# Patient Record
Sex: Female | Born: 1985 | Race: White | Hispanic: Yes | Marital: Single | Smoking: Never smoker
Health system: Southern US, Community
[De-identification: ages and names within clinical notes are randomized; demographics above are authoritative.]

## PROBLEM LIST (undated history)

## (undated) ENCOUNTER — Inpatient Hospital Stay (HOSPITAL_COMMUNITY): Payer: Self-pay

## (undated) DIAGNOSIS — I10 Essential (primary) hypertension: Secondary | ICD-10-CM

## (undated) DIAGNOSIS — D649 Anemia, unspecified: Secondary | ICD-10-CM

## (undated) HISTORY — DX: Essential (primary) hypertension: I10

## (undated) HISTORY — PX: NO PAST SURGERIES: SHX2092

## (undated) HISTORY — DX: Anemia, unspecified: D64.9

---
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
## 2006-02-16 ENCOUNTER — Ambulatory Visit: Payer: Self-pay | Admitting: Gynecology

## 2006-02-22 ENCOUNTER — Inpatient Hospital Stay (HOSPITAL_COMMUNITY): Admission: AD | Admit: 2006-02-22 | Discharge: 2006-02-22 | Payer: Self-pay | Admitting: Obstetrics and Gynecology

## 2006-02-22 ENCOUNTER — Ambulatory Visit: Payer: Self-pay | Admitting: Obstetrics and Gynecology

## 2006-02-23 ENCOUNTER — Inpatient Hospital Stay (HOSPITAL_COMMUNITY): Admission: AD | Admit: 2006-02-23 | Discharge: 2006-02-25 | Payer: Self-pay | Admitting: Family Medicine

## 2006-02-23 ENCOUNTER — Ambulatory Visit: Payer: Self-pay | Admitting: Gynecology

## 2014-07-24 ENCOUNTER — Encounter: Payer: Self-pay | Admitting: Obstetrics & Gynecology

## 2018-05-23 ENCOUNTER — Other Ambulatory Visit (HOSPITAL_COMMUNITY): Payer: Self-pay | Admitting: Family

## 2018-05-23 DIAGNOSIS — Z3A13 13 weeks gestation of pregnancy: Secondary | ICD-10-CM

## 2018-05-23 DIAGNOSIS — Z3689 Encounter for other specified antenatal screening: Secondary | ICD-10-CM

## 2018-05-24 ENCOUNTER — Encounter: Payer: Self-pay | Admitting: Family Medicine

## 2018-05-24 ENCOUNTER — Ambulatory Visit (INDEPENDENT_AMBULATORY_CARE_PROVIDER_SITE_OTHER): Payer: Medicaid Other | Admitting: Family Medicine

## 2018-05-24 DIAGNOSIS — O10019 Pre-existing essential hypertension complicating pregnancy, unspecified trimester: Secondary | ICD-10-CM

## 2018-05-24 DIAGNOSIS — O10011 Pre-existing essential hypertension complicating pregnancy, first trimester: Secondary | ICD-10-CM | POA: Diagnosis not present

## 2018-05-24 DIAGNOSIS — Z3A11 11 weeks gestation of pregnancy: Secondary | ICD-10-CM | POA: Diagnosis not present

## 2018-05-24 DIAGNOSIS — O169 Unspecified maternal hypertension, unspecified trimester: Secondary | ICD-10-CM | POA: Insufficient documentation

## 2018-05-24 DIAGNOSIS — O0991 Supervision of high risk pregnancy, unspecified, first trimester: Secondary | ICD-10-CM

## 2018-05-24 DIAGNOSIS — O0990 Supervision of high risk pregnancy, unspecified, unspecified trimester: Secondary | ICD-10-CM

## 2018-05-24 LAB — POCT URINALYSIS DIP (DEVICE)
Bilirubin Urine: NEGATIVE
Glucose, UA: NEGATIVE mg/dL
HGB URINE DIPSTICK: NEGATIVE
Ketones, ur: NEGATIVE mg/dL
Leukocytes, UA: NEGATIVE
NITRITE: NEGATIVE
PH: 8 (ref 5.0–8.0)
Protein, ur: NEGATIVE mg/dL
SPECIFIC GRAVITY, URINE: 1.02 (ref 1.005–1.030)
UROBILINOGEN UA: 0.2 mg/dL (ref 0.0–1.0)

## 2018-05-24 MED ORDER — PRENATAL VITAMINS 0.8 MG PO TABS: 1.0000 | ORAL_TABLET | Freq: Every day | ORAL | 12 refills | Status: DC

## 2018-05-24 NOTE — Progress Notes (Signed)
   PRENATAL VISIT NOTE  Subjective:  Alexis Potts is a 33 y.o. G2P1001 at [redacted]w[redacted]d being seen today for transferring prenatal care from [facility] due to HTN.  She is currently monitored for the following issues for this high-risk pregnancy and has Supervision of high risk pregnancy, antepartum and Hypertension in pregnancy, antepartum on their problem list.  Patient reports no complaints.  Contractions: Not present. Vag. Bleeding: None.  Movement: Absent. Denies leaking of fluid.   The following portions of the patient's history were reviewed and updated as appropriate: allergies, current medications, past family history, past medical history, past social history, past surgical history and problem list. Problem list updated.  Objective:   Vitals:   05/24/18 0828 05/24/18 0900  BP:  131/85  Pulse:  80  Weight:  145 lb (65.8 kg)  Height: 5' (1.524 m)     Fetal Status: Fetal Heart Rate (bpm): 156   Movement: Absent     General:  Alert, oriented and cooperative. Patient is in no acute distress.  Skin: Skin is warm and dry. No rash noted.   Cardiovascular: Normal heart rate noted  Respiratory: Normal respiratory effort, no problems with respiration noted  Abdomen: Soft, gravid, appropriate for gestational age.  Pain/Pressure: Absent     Pelvic: Cervical exam deferred        Extremities: Normal range of motion.  Edema: None  Mental Status: Normal mood and affect. Normal behavior. Normal judgment and thought content.   Assessment and Plan:  Pregnancy: G2P1001 at [redacted]w[redacted]d  1. Supervision of high risk pregnancy, antepartum desires genetic testing. Needs labs from [facility] - Prenatal Multivit-Min-Fe-FA (PRENATAL VITAMINS) 0.8 MG tablet; Take 1 tablet by mouth daily.  Dispense: 30 tablet; Refill: 12 - Genetic Screening  2. Pre-existing essential hypertension during pregnancy, antepartum On baby ASA start at 12 wks Baseline labs Discussed serial u/s for growth, close monitoring -  Comprehensive metabolic panel - Protein / creatinine ratio, urine  General obstetric precautions including but not limited to vaginal bleeding, contractions, leaking of fluid and fetal movement were reviewed in detail with the patient. Please refer to After Visit Summary for other counseling recommendations.  Return in 4 weeks (on 06/21/2018).  Future Appointments  Date Time Provider Department Center  06/07/2018  8:45 AM WH-MFC [place] 5 WH-MFCUS MFC-US  06/07/2018  9:45 AM WH-MFC LAB WH-MFC MFC-US  06/21/2018  3:15 PM Reva Bores, MD WOC-[facility]A WOC    Reva Bores, MD

## 2018-05-24 NOTE — Patient Instructions (Signed)
° °Lactancia materna °Breastfeeding ° °Decidir amamantar es una de las mejores elecciones que puede hacer por usted y su bebé. Un cambio en las hormonas durante el embarazo hace que las mamas produzcan leche materna en las glándulas productoras de leche. Las hormonas impiden que la leche materna sea liberada antes del nacimiento del bebé. Además, impulsan el flujo de leche luego del nacimiento. Una vez que ha comenzado a amamantar, pensar en el bebé, así como la succión o el llanto, pueden estimular la liberación de leche de las glándulas productoras de leche. °Los beneficios de amamantar °Las investigaciones demuestran que la lactancia materna ofrece muchos beneficios de salud para bebés y madres. Además, ofrece una forma gratuita y conveniente de alimentar al bebé. °Para el bebé °· La primera leche (calostro) ayuda a mejorar el funcionamiento del aparato digestivo del bebé. °· Las células especiales de la leche (anticuerpos) ayudan a combatir las infecciones en el bebé. °· Los bebés que se alimentan con leche materna también tienen menos probabilidades de tener asma, alergias, obesidad o diabetes de tipo 2. Además, tienen menor riesgo de sufrir el síndrome de muerte súbita del lactante (SMSL). °· Los nutrientes de la leche materna son mejores para satisfacer las necesidades del bebé en comparación con la leche maternizada. °· La leche materna mejora el desarrollo cerebral del bebé. °Para usted °· La lactancia materna favorece el desarrollo de un vínculo muy especial entre la madre y el bebé. °· Es conveniente. La leche materna es económica y siempre está disponible a la temperatura correcta. °· La lactancia materna ayuda a quemar calorías. Le ayuda a perder el peso ganado durante el embarazo. °· Hace que el útero vuelva al tamaño que tenía antes del embarazo más rápido. Además, disminuye el sangrado (loquios) después del parto. °· La lactancia materna contribuye a reducir el riesgo de tener diabetes de tipo 2,  osteoporosis, artritis reumatoide, enfermedades cardiovasculares y cáncer de mama, ovario, útero y endometrio en el futuro. °Información básica sobre la lactancia °Comienzo de la lactancia °· Encuentre un lugar cómodo para sentarse o acostarse, con un buen respaldo para el cuello y la espalda. °· Coloque una almohada o una manta enrollada debajo del bebé para acomodarlo a la altura de la mama (si está sentada). Las almohadas para amamantar se han diseñado especialmente a fin de servir de apoyo para los brazos y el bebé mientras amamanta. °· Asegúrese de que la barriga del bebé (abdomen) esté frente a la suya. °· Masajee suavemente la mama. Con las yemas de los dedos, masajee los bordes exteriores de la mama hacia adentro, en dirección al pezón. Esto estimula el flujo de leche. Si la leche fluye lentamente, es posible que deba continuar con este movimiento durante la lactancia. °· Sostenga la mama con 4 dedos por debajo y el pulgar por arriba del pezón (forme la letra “C” con la mano). Asegúrese de que los dedos se encuentren lejos del pezón y de la boca del bebé. °· Empuje suavemente los labios del bebé con el pezón o con el dedo. °· Cuando la boca del bebé se abra lo suficiente, acérquelo rápidamente a la mama e introduzca todo el pezón y la aréola, tanto como sea posible, dentro de la boca del bebé. La aréola es la zona de color que rodea al pezón. °? Debe haber más aréola visible por arriba del labio superior del bebé que por debajo del labio inferior. °? Los labios del bebé deben estar abiertos y extendidos hacia afuera (evertidos) para asegurar   que el bebé se prenda de forma adecuada y cómoda. °? La lengua del bebé debe estar entre la encía inferior y la mama. °· Asegúrese de que la boca del bebé esté en la posición correcta alrededor del pezón (prendido). Los labios del bebé deben crear un sello sobre la mama y estar doblados hacia afuera (invertidos). °· Es común que el bebé succione durante 2 a 3 minutos  para que comience el flujo de leche materna. °Cómo debe prenderse °Es muy importante que le enseñe al bebé cómo prenderse adecuadamente a la mama. Si el bebé no se prende adecuadamente, puede causar dolor en los pezones, reducir la producción de leche materna y hacer que el bebé tenga un escaso aumento de peso. Además, si el bebé no se prende adecuadamente al pezón, puede tragar aire durante la alimentación. Esto puede causarle molestias al bebé. Hacer eructar al bebé al cambiar de mama puede ayudarlo a liberar el aire. Sin embargo, enseñarle al bebé cómo prenderse a la mama adecuadamente es la mejor manera de evitar que se sienta molesto por tragar aire mientras se alimenta. °Signos de que el bebé se ha prendido adecuadamente al pezón °· Tironea o succiona de modo silencioso, sin causarle dolor. Los labios del bebé deben estar extendidos hacia afuera (evertidos). °· Se escucha que traga cada 3 o 4 succiones una vez que la leche ha comenzado a fluir (después de que se produzca el reflejo de eyección de la leche). °· Hay movimientos musculares por arriba y por delante de sus oídos al succionar. °Signos de que el bebé no se ha prendido adecuadamente al pezón °· Hace ruidos de succión o de chasquido mientras se alimenta. °· Siente dolor en los pezones. °Si cree que el bebé no se prendió correctamente, deslice el dedo en la comisura de la boca y colóquelo entre las encías del bebé para interrumpir la succión. Intente volver a comenzar a amamantar. °Signos de lactancia materna exitosa °Signos del bebé °· El bebé disminuirá gradualmente el número de succiones o dejará de succionar por completo. °· El bebé se quedará dormido. °· El cuerpo del bebé se relajará. °· El bebé retendrá una pequeña cantidad de leche en la boca. °· El bebé se desprenderá solo del pecho. °Signos que presenta usted °· Las mamas han aumentado la firmeza, el peso y el tamaño 1 a 3 horas después de amamantar. °· Están más blandas inmediatamente después  de amamantar. °· Se producen un aumento del volumen de leche y un cambio en su consistencia y color hacia el quinto día de lactancia. °· Los pezones no duelen, no están agrietados ni sangran. °Signos de que su bebé recibe la cantidad de leche suficiente °· Mojar por lo menos 1 o 2 pañales durante las primeras 24 horas después del nacimiento. °· Mojar por lo menos 5 o 6 pañales cada 24 horas durante la primera semana después del nacimiento. La orina debe ser clara o de color amarillo pálido a los 5 días de vida. °· Mojar entre 6 y 8 pañales cada 24 horas a medida que el bebé sigue creciendo y desarrollándose. °· Defeca por lo menos 3 veces en 24 horas a los 5 días de vida. Las heces deben ser blandas y amarillentas. °· Defeca por lo menos 3 veces en 24 horas a los 7 días de vida. Las heces deben ser grumosas y amarillentas. °· No registra una pérdida de peso mayor al 10 % del peso al nacer durante los primeros 3 días de vida. °· Aumenta de peso un promedio de 4   a 7 onzas (113 a 198 g) por semana después de los 4 días de vida. °· Aumenta de peso, diariamente, de manera uniforme a partir de los 5 días de vida, sin registrar pérdida de peso después de las 2 semanas de vida. °Después de alimentarse, es posible que el bebé regurgite una pequeña cantidad de leche. Esto es normal. °Frecuencia y duración de la lactancia °El amamantamiento frecuente la ayudará a producir más leche y puede prevenir dolores en los pezones y las mamas extremadamente llenas (congestión mamaria). Alimente al bebé cuando muestre signos de hambre o si siente la necesidad de reducir la congestión de las mamas. Esto se denomina "lactancia a demanda". Las señales de que el bebé tiene hambre incluyen las siguientes: °· Aumento del estado de alerta, actividad o inquietud. °· Mueve la cabeza de un lado a otro. °· Abre la boca cuando se le toca la mejilla o la comisura de la boca (reflejo de búsqueda). °· Aumenta las vocalizaciones, tales como sonidos de  succión, se relame los labios, emite arrullos, suspiros o chirridos. °· Mueve la mano hacia la boca y se chupa los dedos o las manos. °· Está molesto o llora. °Evite el uso del chupete en las primeras 4 a 6 semanas después del nacimiento del bebé. Después de este período, podrá usar un chupete. Las investigaciones demostraron que el uso del chupete durante el primer año de vida del bebé disminuye el riesgo de tener el síndrome de muerte súbita del lactante (SMSL). °Permita que el niño se alimente en cada mama todo lo que desee. Cuando el bebé se desprende o se queda dormido mientras se está alimentando de la primera mama, ofrézcale la segunda. Debido a que, con frecuencia, los recién nacidos están somnolientos las primeras semanas de vida, es posible que deba despertar al bebé para alimentarlo. °Los horarios de lactancia varían de un bebé a otro. Sin embargo, las siguientes reglas pueden servir como guía para ayudarla a garantizar que el bebé se alimenta adecuadamente: °· Se puede amamantar a los recién nacidos (bebés de 4 semanas o menos de vida) cada 1 a 3 horas. °· No deben transcurrir más de 3 horas durante el día o 5 horas durante la noche sin que se amamante a los recién nacidos. °· Debe amamantar al bebé un mínimo de 8 veces en un período de 24 horas. °Extracción de leche materna ° °  ° °La extracción y el almacenamiento de la leche materna le permiten asegurarse de que el bebé se alimente exclusivamente de su leche materna, aun en momentos en los que no puede amamantar. Esto tiene especial importancia si debe regresar al trabajo en el período en que aún está amamantando o si no puede estar presente en los momentos en que el bebé debe alimentarse. Su asesor en lactancia puede ayudarla a encontrar un método de extracción que funcione mejor para usted y orientarla sobre cuánto tiempo es seguro almacenar leche materna. °Cómo cuidar las mamas durante la lactancia °Los pezones pueden secarse, agrietarse y doler  durante la lactancia. Las siguientes recomendaciones pueden ayudarla a mantener las mamas humectadas y sanas: °· Evite usar jabón en los pezones. °· Use un sostén de soporte diseñado especialmente para la lactancia materna. Evite usar sostenes con aro o sostenes muy ajustados (sostenes deportivos). °· Seque al aire sus pezones durante 3 a 4 minutos después de amamantar al bebé. °· Utilice solo apósitos de algodón en el sostén para absorber las pérdidas de leche. La pérdida de un poco de leche materna entre   las tomas es normal. °· Utilice lanolina sobre los pezones luego de amamantar. La lanolina ayuda a mantener la humedad normal de la piel. La lanolina pura no es perjudicial (no es tóxica) para el bebé. Además, puede extraer manualmente algunas gotas de leche materna y masajear suavemente esa leche sobre los pezones para que la leche se seque al aire. °Durante las primeras semanas después del nacimiento, algunas mujeres experimentan congestión mamaria. La congestión mamaria puede hacer que sienta las mamas pesadas, calientes y sensibles al tacto. El pico de la congestión mamaria ocurre en el plazo de los 3 a 5 días después del parto. Las siguientes recomendaciones pueden ayudarla a aliviar la congestión mamaria: °· Vacíe por completo las mamas al amamantar o extraer leche. Puede aplicar calor húmedo en las mamas (en la ducha o con toallas húmedas para manos) antes de amamantar o extraer leche. Esto aumenta la circulación y ayuda a que la leche fluya. Si el bebé no vacía por completo las mamas cuando lo amamanta, extraiga la leche restante después de que haya finalizado. °· Aplique compresas de hielo sobre las mamas inmediatamente después de amamantar o extraer leche, a menos que le resulte demasiado incómodo. Haga lo siguiente: °? Ponga el hielo en una bolsa plástica. °? Coloque una toalla entre la piel y la bolsa de hielo. °? Coloque el hielo durante 20 minutos, 2 o 3 veces por día. °· Asegúrese de que el bebé  esté prendido y se encuentre en la posición correcta mientras lo alimenta. °Si la congestión mamaria persiste luego de 48 horas o después de seguir estas recomendaciones, comuníquese con su médico o un asesor en lactancia. °Recomendaciones de salud general durante la lactancia °· Consuma 3 comidas y 3 colaciones saludables todos los días. Las madres bien alimentadas que amamantan necesitan entre 450 y 500 calorías adicionales por día. Puede cumplir con este requisito al aumentar la cantidad de una dieta equilibrada que realice. °· Beba suficiente agua para mantener la orina clara o de color amarillo pálido. °· Descanse con frecuencia, relájese y siga tomando sus vitaminas prenatales para prevenir la fatiga, el estrés y los niveles bajos de vitaminas y minerales en el cuerpo (deficiencias de nutrientes). °· No consuma ningún producto que contenga nicotina o tabaco, como cigarrillos y cigarrillos electrónicos. El bebé puede verse afectado por las sustancias químicas de los cigarrillos que pasan a la leche materna y por la exposición al humo ambiental del tabaco. Si necesita ayuda para dejar de fumar, consulte al médico. °· Evite el consumo de alcohol. °· No consuma drogas ilegales o marihuana. °· Antes de usar cualquier medicamento, hable con el médico. Estos incluyen medicamentos recetados y de venta libre, como también vitaminas y suplementos a base de hierbas. Algunos medicamentos, que pueden ser perjudiciales para el bebé, pueden pasar a través de la leche materna. °· Puede quedar embarazada durante la lactancia. Si se desea un método anticonceptivo, consulte al médico sobre cuáles son las opciones seguras durante la lactancia. °Dónde encontrar más información: °Liga internacional [place]: www.llli.org. °Comuníquese con un médico si: °· Siente que quiere dejar de amamantar o se siente frustrada con la lactancia. °· Sus pezones están agrietados o sangran. °· Sus mamas están irritadas, sensibles o  calientes. °· Tiene los siguientes síntomas: °? Dolor en las mamas o en los pezones. °? Un área hinchada en cualquiera de las mamas. °? Fiebre o escalofríos. °? Náuseas o vómitos. °? Drenaje de otro líquido distinto de la leche materna desde los pezones. °· Sus mamas no   se llenan antes de amamantar al bebé para el quinto día después del parto. °· Se siente triste y deprimida. °· El bebé: °? Está demasiado somnoliento como para comer bien. °? Tiene problemas para dormir. °? Tiene más de 1 semana de vida y moja menos de 6 pañales en un periodo de 24 horas. °? No ha aumentado de peso a los 5 días de vida. °· El bebé defeca menos de 3 veces en 24 horas. °· La piel del bebé o las partes blancas de los ojos se vuelven amarillentas. °Solicite ayuda de inmediato si: °· El bebé está muy cansado (letargo) y no se quiere despertar para comer. °· Le sube la fiebre sin causa. °Resumen °· La lactancia materna ofrece muchos beneficios de salud para bebés y madres. °· Intente amamantar a su bebé cuando muestre signos tempranos de hambre. °· Haga cosquillas o empuje suavemente los labios del bebé con el dedo o el pezón para lograr que el bebé abra la boca. Acerque el bebé a la mama. Asegúrese de que la mayor parte de la aréola se encuentre dentro de la boca del bebé. Ofrézcale una mama y haga eructar al bebé antes de pasar a la otra. °· Hable con su médico o asesor en lactancia si tiene dudas o problemas con la lactancia. °Esta información no tiene como fin reemplazar el consejo del médico. Asegúrese de hacerle al médico cualquier pregunta que tenga. °Document Released: 04/05/2005 Document Revised: 07/26/2016 Document Reviewed: 07/26/2016 °Elsevier Interactive Patient Education © 2019 Elsevier Inc. ° °

## 2018-05-25 LAB — COMPREHENSIVE METABOLIC PANEL
ALBUMIN: 4.2 g/dL (ref 3.8–4.8)
ALT: 13 IU/L (ref 0–32)
AST: 15 IU/L (ref 0–40)
Albumin/Globulin Ratio: 1.4 (ref 1.2–2.2)
Alkaline Phosphatase: 40 IU/L (ref 39–117)
BUN / CREAT RATIO: 9 (ref 9–23)
BUN: 4 mg/dL — AB (ref 6–20)
Bilirubin Total: 0.3 mg/dL (ref 0.0–1.2)
CALCIUM: 9.5 mg/dL (ref 8.7–10.2)
CO2: 19 mmol/L — AB (ref 20–29)
CREATININE: 0.46 mg/dL — AB (ref 0.57–1.00)
Chloride: 103 mmol/L (ref 96–106)
GFR, EST AFRICAN AMERICAN: 153 mL/min/{1.73_m2} (ref >59)
GFR, EST NON AFRICAN AMERICAN: 133 mL/min/{1.73_m2} (ref >59)
GLUCOSE: 80 mg/dL (ref 65–99)
Globulin, Total: 2.9 g/dL (ref 1.5–4.5)
Potassium: 4 mmol/L (ref 3.5–5.2)
Sodium: 137 mmol/L (ref 134–144)
TOTAL PROTEIN: 7.1 g/dL (ref 6.0–8.5)

## 2018-05-25 LAB — PROTEIN / CREATININE RATIO, URINE
Creatinine, Urine: 73.8 mg/dL
Protein, Ur: 11.5 mg/dL
Protein/Creat Ratio: 156 mg/g creat (ref 0–200)

## 2018-05-31 ENCOUNTER — Encounter (HOSPITAL_COMMUNITY): Payer: Self-pay

## 2018-06-05 ENCOUNTER — Encounter: Payer: Self-pay | Admitting: *Deleted

## 2018-06-06 ENCOUNTER — Encounter: Payer: Self-pay | Admitting: *Deleted

## 2018-06-07 ENCOUNTER — Encounter (HOSPITAL_COMMUNITY): Payer: Self-pay

## 2018-06-07 ENCOUNTER — Other Ambulatory Visit (HOSPITAL_COMMUNITY): Payer: Self-pay

## 2018-06-07 ENCOUNTER — Ambulatory Visit (HOSPITAL_COMMUNITY): Admission: RE | Admit: 2018-06-07 | Payer: Self-pay | Source: Ambulatory Visit

## 2018-06-11 ENCOUNTER — Inpatient Hospital Stay (HOSPITAL_COMMUNITY)
Admission: AD | Admit: 2018-06-11 | Discharge: 2018-06-12 | Disposition: A | Discharge status: Home / Self Care | Payer: Medicaid Other | Attending: Obstetrics & Gynecology | Admitting: Obstetrics & Gynecology

## 2018-06-11 DIAGNOSIS — O4690 Antepartum hemorrhage, unspecified, unspecified trimester: Secondary | ICD-10-CM

## 2018-06-11 DIAGNOSIS — Z3A13 13 weeks gestation of pregnancy: Secondary | ICD-10-CM | POA: Insufficient documentation

## 2018-06-11 DIAGNOSIS — O200 Threatened abortion: Secondary | ICD-10-CM | POA: Insufficient documentation

## 2018-06-11 NOTE — MAU Note (Signed)
Pt here for vaginal bleeding that started today. Denies pain.

## 2018-06-12 ENCOUNTER — Encounter (HOSPITAL_COMMUNITY): Payer: Self-pay | Admitting: *Deleted

## 2018-06-12 ENCOUNTER — Inpatient Hospital Stay (HOSPITAL_COMMUNITY): Payer: Self-pay

## 2018-06-12 DIAGNOSIS — O4690 Antepartum hemorrhage, unspecified, unspecified trimester: Secondary | ICD-10-CM | POA: Diagnosis not present

## 2018-06-12 DIAGNOSIS — O200 Threatened abortion: Secondary | ICD-10-CM

## 2018-06-12 DIAGNOSIS — O10019 Pre-existing essential hypertension complicating pregnancy, unspecified trimester: Secondary | ICD-10-CM | POA: Diagnosis not present

## 2018-06-12 LAB — WET PREP, GENITAL
Clue Cells Wet Prep HPF POC: NONE SEEN
Sperm: NONE SEEN
Trich, Wet Prep: NONE SEEN
Yeast Wet Prep HPF POC: NONE SEEN

## 2018-06-12 LAB — CBC
HCT: 36.2 % (ref 36.0–46.0)
Hemoglobin: 12.4 g/dL (ref 12.0–15.0)
MCH: 30.8 pg (ref 26.0–34.0)
MCHC: 34.3 g/dL (ref 30.0–36.0)
MCV: 90 fL (ref 80.0–100.0)
Platelets: 245 10*3/uL (ref 150–400)
RBC: 4.02 MIL/uL (ref 3.87–5.11)
RDW: 13.2 % (ref 11.5–15.5)
WBC: 6.6 10*3/uL (ref 4.0–10.5)
nRBC: 0 % (ref 0.0–0.2)

## 2018-06-12 LAB — ABO/RH: ABO/RH(D): A POS

## 2018-06-12 LAB — TYPE AND SCREEN
ABO/RH(D): A POS
Antibody Screen: NEGATIVE

## 2018-06-12 LAB — HCG, QUANTITATIVE, PREGNANCY: hCG, Beta Chain, Quant, S: 3104 m[IU]/mL — ABNORMAL HIGH (ref <5)

## 2018-06-12 NOTE — Discharge Instructions (Signed)
Amenaza de aborto  Threatened Miscarriage    La amenaza de aborto espontneo se produce cuando hay hemorragia vaginal durante las primeras 20semanas de embarazo, pero el embarazo no se interrumpe. El mdico le har pruebas para asegurarse de que el embarazo contine. La causa de la hemorragia puede ser desconocida. Esta afeccin no significa que el embarazo se interrumpir, pero s aumenta el riesgo de que suceda (aborto espontneo completo).  Siga estas indicaciones en su casa:   Descanse lo suficiente.   Si tiene hemorragia vaginal, no tenga relaciones sexuales ni use tampones.   No se haga duchas vaginales.   No fume ni consuma drogas.   No beba alcohol.   Evite la cafena.   Concurra a todas las visitas de control prenatal como se lo haya indicado el mdico. Esto es importante.  Comunquese con un mdico si:   Tiene una hemorragia leve de la vagina.   Tiene dolor o clicos abdominales.   Tiene fiebre.  Solicite ayuda de inmediato si:   Tiene hemorragia abundante de la vagina.   Elimina cogulos de sangre por la vagina.   Elimina tejido por la vagina.   Tiene una prdida de lquido por la vagina.   Tiene una prdida de lquido por la vagina.   Tiene dolor muy intenso en el abdomen o la parte baja de la espalda, o clicos abdominales o calambres en la parte baja de la espalda.   Tiene fiebre, escalofros y dolor muy intenso en el vientre.  Resumen   La amenaza de aborto espontneo se produce cuando hay hemorragia vaginal durante las primeras 20semanas de embarazo, pero el embarazo no se interrumpe.   Esta afeccin no significa que el embarazo se interrumpir, pero s aumenta el riesgo de que suceda (aborto espontneo completo).   Descanse lo suficiente. Si tiene hemorragia vaginal, no tenga relaciones sexuales ni use tampones.   Concurra a todas las visitas de control prenatal como se lo haya indicado el mdico. Esto es importante.  Esta informacin no tiene como fin reemplazar el consejo  del mdico. Asegrese de hacerle al mdico cualquier pregunta que tenga.  Document Released: 05/08/2010 Document Revised: 11/09/2016 Document Reviewed: 11/09/2016  Elsevier Interactive Patient Education  2019 [employer]vier Inc[employer]

## 2018-06-12 NOTE — MAU Note (Signed)
Urine sent to lab 

## 2018-06-12 NOTE — MAU Provider Note (Signed)
History     CSN: [id number]  Arrival date and time: 06/11/18 2339   First Provider Initiated Contact with Patient 06/12/18 0024      Chief Complaint  Patient presents with  . Vaginal Bleeding   Alexis Potts is a 33 y.o. G2P1 at [redacted]w[redacted]d by LMP who presents to MAU with complaints of vaginal bleeding. She reports vaginal bleeding started occurring this evening. Describes vaginal bleeding as dark red bleeding when she wipes, denies having to wear panty liner or pad for vaginal bleeding. She denies abdominal pain/cramping. Receives prenatal care at [facility]. Complications of pregnancy include Hypertension.    OB History    Gravida  2   Para  1   Term  1   Preterm  0   AB      Living  1     SAB      TAB      Ectopic      Multiple      Live Births  1           Past Medical History:  Diagnosis Date  . Anemia   . Hypertension     Past Surgical History:  Procedure Laterality Date  . NO PAST SURGERIES      Family History  Problem Relation Age of Onset  . Diabetes Brother     Social History   Tobacco Use  . Smoking status: Never Smoker  . Smokeless tobacco: Never Used  Substance Use Topics  . Alcohol use: Not Currently  . Drug use: Never    Allergies: No Known Allergies  Medications Prior to Admission  Medication Sig Dispense Refill Last Dose  . aspirin 81 MG chewable tablet Chew 81 mg by mouth daily.   06/11/2018 at Unknown time  . labetalol (NORMODYNE) 100 MG tablet Take 100 mg by mouth 2 (two) times daily.   06/11/2018 at Unknown time  . Prenatal Multivit-Min-Fe-FA (PRENATAL VITAMINS) 0.8 MG tablet Take 1 tablet by mouth daily. 30 tablet 12 06/12/2018 at Unknown time    Review of Systems  Constitutional: Negative.   Respiratory: Negative.   Cardiovascular: Negative.   Gastrointestinal: Negative.   Genitourinary: Positive for vaginal bleeding. Negative for difficulty urinating, dysuria, frequency, hematuria and urgency.   Musculoskeletal: Negative.   Neurological: Negative.    Physical Exam   Blood pressure (!) 147/97, pulse 87, temperature 99.4 F (37.4 C), temperature source Oral, resp. rate 16, height 5' (1.524 m), weight 68.5 kg, last menstrual period 03/08/2018, SpO2 98 %.  Physical Exam  Nursing note and vitals reviewed. Constitutional: She is oriented to person, place, and time. She appears well-developed and well-nourished. No distress.  Cardiovascular: Normal rate, regular rhythm and normal heart sounds.  Respiratory: Effort normal and breath sounds normal. No respiratory distress. She has no wheezes. She has no rales.  GI: Soft. Bowel sounds are normal. She exhibits no distension. There is no abdominal tenderness. There is no rebound.  Genitourinary:    Vaginal bleeding present.  There is bleeding in the vagina.    Genitourinary Comments: Pelvic exam: Cervix pink, visually closed, without lesion, small amount of dark red vaginal bleeding without clots, vaginal walls and external genitalia normal Bimanual exam: Cervix closed    Musculoskeletal: Normal range of motion.        General: No edema.  Neurological: She is alert and oriented to person, place, and time.  Psychiatric: She has a normal mood and affect. Her behavior is normal. Thought content normal.  RN unable to obtain FHT by doppler, bedside US by CNM performed   Pt informed that the ultrasound is considered a limited OB ultrasound and is not intended to be a complete ultrasound exam.  Patient also informed that the ultrasound is not being completed with the intent of assessing for fetal or placental anomalies or any pelvic abnormalities.  Explained that the purpose of today's ultrasound is to assess for  viability.  Patient acknowledges the purpose of the exam and the limitations of the study.    Unable to obtain FHT on bedside US, formal US ordered   MAU Course  Procedures  MDM Orders Placed This Encounter  Procedures  . Wet  prep, genital  . US OB LESS THAN 14 WEEKS WITH OB TRANSVAGINAL  . US OB Transvaginal  . CBC  . hCG, quantitative, pregnancy  . Type and screen [facility]  . ABO/Rh   Labs and [place] report reviewed:  Results for orders placed or performed during the hospital encounter of 06/11/18 (from the past 24 hour(s))  Wet prep, genital     Status: Abnormal   Collection Time: 06/12/18 12:34 AM  Result Value Ref Range   Yeast Wet Prep HPF POC NONE SEEN NONE SEEN   Trich, Wet Prep NONE SEEN NONE SEEN   Clue Cells Wet Prep HPF POC NONE SEEN NONE SEEN   WBC, Wet Prep HPF POC MANY (A) NONE SEEN   Sperm NONE SEEN   CBC     Status: None   Collection Time: 06/12/18 12:56 AM  Result Value Ref Range   WBC 6.6 4.0 - 10.5 K/uL   RBC 4.02 3.87 - 5.11 MIL/uL   Hemoglobin 12.4 12.0 - 15.0 g/dL   HCT 16.1 09.6 - 04.5 %   MCV 90.0 80.0 - 100.0 fL   MCH 30.8 26.0 - 34.0 pg   MCHC 34.3 30.0 - 36.0 g/dL   RDW 40.9 81.1 - 91.4 %   Platelets 245 150 - 400 K/uL   nRBC 0.0 0.0 - 0.2 %  Type and screen [facility]     Status: None   Collection Time: 06/12/18 12:56 AM  Result Value Ref Range   ABO/RH(D) A POS    Antibody Screen NEG    Sample Expiration      06/15/2018 Performed at [facility], [address] [place], [place] [address]   hCG, quantitative, pregnancy     Status: Abnormal   Collection Time: 06/12/18 12:56 AM  Result Value Ref Range   hCG, Beta Chain, Quant, S 3,104 (H) <5 mIU/mL  ABO/Rh     Status: None   Collection Time: 06/12/18 12:56 AM  Result Value Ref Range   ABO/RH(D)      A POS Performed at [facility], [address], [place], [place] [address]    US Ob Less Than 14 Weeks With Ob Transvaginal  Result Date: 06/12/2018 CLINICAL DATA:  33 y/o  F; bleeding. EXAM: OBSTETRIC <14 WK [place] AND TRANSVAGINAL OB US TECHNIQUE: Both transabdominal and transvaginal ultrasound examinations were performed for complete evaluation of the gestation as  well as the maternal uterus, adnexal regions, and pelvic cul-de-sac. Transvaginal technique was performed to assess early pregnancy. COMPARISON:  None. FINDINGS: Intrauterine gestational sac: Single Yolk sac:  Questionable Embryo:  Questionable Cardiac Activity: Not Visualized. Large irregular intrauterine gestational sac containing a cystic structure, possibly an enlarged yolk sac or amnion. There are 2 small adjacent solid  structures without a heart rate on M-mode Doppler. One of these structures may represent a fetal pole. The largest measures 4.1 mm. CRL: 4.1 mm   6 w   1 d                  [place] EDC: 02/04/2019 Subchorionic hemorrhage:  Small subchorionic hemorrhage. Maternal uterus/adnexae: Normal. IMPRESSION: Large irregular intrauterine gestational sac containing a cystic structure, possibly an enlarged yolk sac or amnion. There are 2 small adjacent solid structures without a heart rate on M-mode Doppler. One of these structures may represent a fetal pole. The largest measures 4.1 mm which would correspond to 6 weeks and 1 day gestational age. Findings are suspicious but not yet definitive for failed pregnancy. Recommend follow-up US in 10-14 days for definitive diagnosis. This recommendation follows SRU consensus guidelines: Diagnostic Criteria for Nonviable Pregnancy Early in the First Trimester. Malva Limes Med 2013; 242:3536-14. Electronically Signed   By: Mitzi Hansen M.D.   On: 06/12/2018 02:10   Consult with Dr Despina Hidden about finding on ultrasound, recommends f/u US in 1 week and discharge home with precautions.   Educated and discussed results with patient, Most likely failed pregnancy but unable to diagnose at this time based on guidelines. Threatened miscarriage precautions given based on vaginal bleeding present, discussed reasons to return to MAU. Pt stable at time of discharge.   Assessment and Plan   1. Threatened miscarriage   2. Vaginal bleeding during pregnancy    Discharge  home Most likely failed pregnancy, f/u US in 1 week  Message sent to clinic to have appointment scheduled with provider after ultrasound.  Return to MAU as needed Continue medication as prescribed   Allergies as of 06/12/2018   No Known Allergies     Medication List    TAKE these medications   aspirin 81 MG chewable tablet Chew 81 mg by mouth daily.   labetalol 100 MG tablet Commonly known as:  NORMODYNE Take 100 mg by mouth 2 (two) times daily.   Prenatal Vitamins 0.8 MG tablet Take 1 tablet by mouth daily.       Sharyon Cable CNM 06/12/2018, 3:56 AM

## 2018-06-13 LAB — GC/CHLAMYDIA PROBE AMP (~~LOC~~) NOT AT ARMC
Chlamydia: NEGATIVE
Neisseria Gonorrhea: NEGATIVE

## 2018-06-21 ENCOUNTER — Ambulatory Visit (INDEPENDENT_AMBULATORY_CARE_PROVIDER_SITE_OTHER): Payer: Self-pay | Admitting: Family Medicine

## 2018-06-21 VITALS — BP 120/82 | HR 76 | Wt 149.2 lb

## 2018-06-21 DIAGNOSIS — O0990 Supervision of high risk pregnancy, unspecified, unspecified trimester: Secondary | ICD-10-CM

## 2018-06-21 DIAGNOSIS — O10019 Pre-existing essential hypertension complicating pregnancy, unspecified trimester: Secondary | ICD-10-CM

## 2018-06-21 NOTE — Progress Notes (Signed)
   Subjective:    Patient ID: Alexis Potts is a 33 y.o. female presenting with No chief complaint on file.  on 06/21/2018  HPI: Here for ROB visit. Previously noted to have +FHR at 11 wks, when came for New OB. Had ED visit which showed apparent failed pregnancy. Of note, had normal panorama. Has had some continued spotting. She reports feeling FM. Has u/s scheduled for tomorrow.  Review of Systems  Constitutional: Negative for chills and fever.  Respiratory: Negative for shortness of breath.   Cardiovascular: Negative for chest pain.  Gastrointestinal: Negative for abdominal pain, nausea and vomiting.  Genitourinary: Negative for dysuria.  Skin: Negative for rash.      Objective:    BP 120/82   Pulse 76   Wt 149 lb 3.2 oz (67.7 kg)   LMP 03/08/2018 (Exact Date)   BMI 29.14 kg/m  Physical Exam Constitutional:      General: She is not in acute distress.    Appearance: She is well-developed.  HENT:     Head: Normocephalic and atraumatic.  Eyes:     General: No scleral icterus. Neck:     Musculoskeletal: Neck supple.  Cardiovascular:     Rate and Rhythm: Normal rate.  Pulmonary:     Effort: Pulmonary effort is normal.  Abdominal:     Palpations: Abdomen is soft.  Skin:    General: Skin is warm and dry.  Neurological:     Mental Status: She is alert and oriented to person, place, and time.         Assessment & Plan:   Problem List Items Addressed This Visit      Unprioritized   Supervision of high risk pregnancy, antepartum    Likely failed. For f/u u/s tomorrow. Discussed option of meds vs. D and C today, and high likelihood of failed pregnancy. She is wanting to repeat u/s for confirmation.      Hypertension in pregnancy, antepartum - Primary    Spanish interpreter: [name] used   Total face-to-face time with patient: 15 minutes. Over 50% of encounter was spent on counseling and coordination of care. Return in about 1 day (around  06/22/2018).  Reva Bores 06/21/2018 3:47 PM

## 2018-06-21 NOTE — Assessment & Plan Note (Signed)
Likely failed. For f/u u/s tomorrow. Discussed option of meds vs. D and C today, and high likelihood of failed pregnancy. She is wanting to repeat u/s for confirmation.

## 2018-06-22 ENCOUNTER — Ambulatory Visit (INDEPENDENT_AMBULATORY_CARE_PROVIDER_SITE_OTHER): Payer: Self-pay | Admitting: Obstetrics and Gynecology

## 2018-06-22 ENCOUNTER — Ambulatory Visit (HOSPITAL_COMMUNITY)
Admission: RE | Admit: 2018-06-22 | Discharge: 2018-06-22 | Disposition: A | Discharge status: Home / Self Care | Payer: Self-pay | Source: Ambulatory Visit | Attending: Certified Nurse Midwife | Admitting: Certified Nurse Midwife

## 2018-06-22 ENCOUNTER — Encounter: Payer: Self-pay | Admitting: Obstetrics and Gynecology

## 2018-06-22 DIAGNOSIS — O10019 Pre-existing essential hypertension complicating pregnancy, unspecified trimester: Secondary | ICD-10-CM

## 2018-06-22 DIAGNOSIS — O4690 Antepartum hemorrhage, unspecified, unspecified trimester: Secondary | ICD-10-CM | POA: Insufficient documentation

## 2018-06-22 DIAGNOSIS — O039 Complete or unspecified spontaneous abortion without complication: Secondary | ICD-10-CM

## 2018-06-22 DIAGNOSIS — O200 Threatened abortion: Secondary | ICD-10-CM | POA: Insufficient documentation

## 2018-06-22 HISTORY — DX: Complete or unspecified spontaneous abortion without complication: O03.9

## 2018-06-22 MED ORDER — OXYCODONE-ACETAMINOPHEN 5-325 MG PO TABS: 1.0000 | ORAL_TABLET | ORAL | 0 refills | Status: DC | PRN

## 2018-06-22 MED ORDER — IBUPROFEN 600 MG PO TABS: 600.0000 mg | ORAL_TABLET | Freq: Four times a day (QID) | ORAL | 3 refills | Status: DC | PRN

## 2018-06-22 MED ORDER — LABETALOL HCL 100 MG PO TABS: 100.0000 mg | ORAL_TABLET | Freq: Two times a day (BID) | ORAL | 0 refills | Status: DC

## 2018-06-22 MED ORDER — MISOPROSTOL 200 MCG PO TABS: ORAL_TABLET | ORAL | 0 refills | Status: DC

## 2018-06-22 NOTE — Patient Instructions (Signed)
Misoprostol tablets What is this medicine? MISOPROSTOL (mye soe PROST ole) helps to prevent stomach ulcers in patients who take medicines like ibuprofen and aspirin and who are at high risk of complications from ulcers. This medicine may be used for other purposes; ask your health care provider or pharmacist if you have questions. COMMON BRAND NAME(S): Cytotec What should I tell my health care provider before I take this medicine? They need to know if you have any of these conditions: -Crohn's disease -heart disease -kidney disease -ulcerative colitis -an unusual or allergic reaction to misoprostol, prostaglandins, other medicines, foods, dyes, or preservatives -pregnant or trying to get pregnant -breast-feeding How should I use this medicine? Take this medicine by mouth with a full glass of water. Follow the directions on the prescription label. Take this medicine with food.Take your medicine at regular intervals. Do not take your medicine more often than directed. Talk to your pediatrician regarding the use of this medicine in children. Special care may be needed. Overdosage: If you think you have taken too much of this medicine contact a poison control center or emergency room at once. NOTE: This medicine is only for you. Do not share this medicine with others. What if I miss a dose? If you miss a dose, take it as soon as you can. If it is almost time for your next dose, take only that dose. Do not take double or extra doses. What may interact with this medicine? -antacids This list may not describe all possible interactions. Give your health care provider a list of all the medicines, herbs, non-prescription drugs, or dietary supplements you use. Also tell them if you smoke, drink alcohol, or use illegal drugs. Some items may interact with your medicine. What should I watch for while using this medicine? Do not smoke cigarettes or drink alcohol. These increase irritation to your stomach  and can make it more susceptible to damage from medicine like ibuprofen and aspirin. If you are female, do not use this medicine if you are pregnant. Do not get pregnant while taking this medicine and for at least one month (one full menstrual cycle) after stopping this medicine. If you can become pregnant, use a reliable form of birth control while taking this medicine. Talk to your doctor about birth control options. If you do become pregnant, think you are pregnant, or want to become pregnant, immediately call your doctor for advice. What side effects may I notice from receiving this medicine? Side effects that you should report to your doctor or health care professional as soon as possible: -allergic reactions like skin rash, itching or hives, swelling of the face, lips, or tongue -chest pain -fainting spells -severe diarrhea -sudden shortness of breath -unusual vaginal bleeding, pelvic pain, or cramping Side effects that usually do not require medical attention (report to your doctor or health care professional if they continue or are bothersome): -dizziness -headache -menstrual irregularity, spotting, or cramps -mild diarrhea -nausea -stomach upset or cramps This list may not describe all possible side effects. Call your doctor for medical advice about side effects. You may report side effects to FDA at 1-800-FDA-1088. Where should I keep my medicine? Keep out of the reach of children. Store at room temperature below 25 degrees C (77 degrees F). Keep in a dry place. Protect from moisture. Throw away any unused medicine after the expiration date. NOTE: This sheet is a summary. It may not cover all possible information. If you have questions about this medicine, talk to  your doctor, pharmacist, or health care provider.  2019 Elsevier/Gold Standard (2008-03-19 10:59:53)

## 2018-06-22 NOTE — Progress Notes (Signed)
Ms Alexis Potts presents for U/S f/u for ? Failed pregnancy. U/S today no evidence of IUP, slightly thicken endometrium. Pt with min to no bleeding or cramps  U/S results discussed with pt.  PE AF VSS Lungs clear Heart RRR  Blood type O +  A/P SAB        HTN  First trimester AB causes reviewed with pt. Management options reviewed with pt. Pt desires to proceed with Cytotec. U/R/B discussed with pt. Bleeding precautions reviewed and indications to present to ER discussed. RX for Cytotec/Motrin/Percocet provided to pt. Will continue with Labetalol for now. Will check BHCG in 2 weeks and MD follow up in 4 weeks or PRN or indicated by test results. Live Interrupter was used during today's visit. Information on Cytotec used provided to pt.

## 2018-06-26 ENCOUNTER — Other Ambulatory Visit: Payer: Self-pay

## 2018-06-26 ENCOUNTER — Inpatient Hospital Stay (HOSPITAL_COMMUNITY)
Admission: EM | Admit: 2018-06-26 | Discharge: 2018-06-27 | Disposition: A | Discharge status: Home / Self Care | Payer: Self-pay | Attending: Obstetrics and Gynecology | Admitting: Obstetrics and Gynecology

## 2018-06-26 DIAGNOSIS — R51 Headache: Secondary | ICD-10-CM | POA: Insufficient documentation

## 2018-06-26 DIAGNOSIS — O039 Complete or unspecified spontaneous abortion without complication: Secondary | ICD-10-CM | POA: Insufficient documentation

## 2018-06-26 DIAGNOSIS — Z7982 Long term (current) use of aspirin: Secondary | ICD-10-CM | POA: Insufficient documentation

## 2018-06-26 DIAGNOSIS — Z79899 Other long term (current) drug therapy: Secondary | ICD-10-CM | POA: Insufficient documentation

## 2018-06-26 DIAGNOSIS — R6889 Other general symptoms and signs: Secondary | ICD-10-CM

## 2018-06-26 DIAGNOSIS — R509 Fever, unspecified: Secondary | ICD-10-CM | POA: Insufficient documentation

## 2018-06-26 DIAGNOSIS — O9989 Other specified diseases and conditions complicating pregnancy, childbirth and the puerperium: Secondary | ICD-10-CM | POA: Insufficient documentation

## 2018-06-26 LAB — INFLUENZA PANEL BY PCR (TYPE A & B)
Influenza A By PCR: NEGATIVE
Influenza B By PCR: NEGATIVE

## 2018-06-26 MED ORDER — ACETAMINOPHEN 500 MG PO TABS
1000.0000 mg | ORAL_TABLET | Freq: Once | ORAL | Status: AC
  Administered 2018-06-26: 1000 mg via ORAL
  Filled 2018-06-26: qty 2

## 2018-06-26 MED ORDER — SODIUM CHLORIDE 0.9 % IV SOLN
INTRAVENOUS | Status: DC
  Administered 2018-06-26: 125 mL/h via INTRAVENOUS

## 2018-06-26 MED ORDER — DEXAMETHASONE SODIUM PHOSPHATE 10 MG/ML IJ SOLN
10.0000 mg | Freq: Once | INTRAMUSCULAR | Status: AC
  Administered 2018-06-26: 10 mg via INTRAVENOUS
  Filled 2018-06-26: qty 1

## 2018-06-26 MED ORDER — METOCLOPRAMIDE HCL 5 MG/ML IJ SOLN
10.0000 mg | Freq: Once | INTRAMUSCULAR | Status: AC
  Administered 2018-06-26: 10 mg via INTRAVENOUS
  Filled 2018-06-26: qty 2

## 2018-06-26 MED ORDER — DIPHENHYDRAMINE HCL 50 MG/ML IJ SOLN
25.0000 mg | Freq: Once | INTRAMUSCULAR | Status: AC
  Administered 2018-06-26: 25 mg via INTRAVENOUS
  Filled 2018-06-26: qty 1

## 2018-06-26 NOTE — MAU Note (Addendum)
Pt had a recent SAB on Saturday. Reports she was given medication and believes she passed everything. Reports HA, body aches and fever that started this morning. Reports a temp of 101. Took ibuprofen around 2pm. Pt reports just a small amount of vaginal bleeding. Denies odor. Report just a small amount of abdominal pain but a lot of pain in lower back and hips.

## 2018-06-26 NOTE — MAU Provider Note (Signed)
History     CSN: [id number]  Arrival date and time: 06/26/18 6606   First Provider Initiated Contact with Patient 06/26/18 2057     Chief Complaint  Patient presents with  . Fever  . Headache   Alexis Potts is a 33 y.o. G2P1 recent SAB who presents to MAU with complaints of HA and fever. She reports symptoms started occurring this morning. Reports taking her temperature at home that was 101. Denies being around anyone sick in the household. In association to fever has body aches and chills. She reports body aches started out as just her head which she rates 7/10- has taken ibuprofen for with little relief. Then over today "entire body started to hurt". She reports passing miscarriage on Saturday and since then bleeding has slowed where she is only wearing a panty liner, denies bleeding increasing today or cramping.    OB History    Gravida  2   Para  1   Term  1   Preterm  0   AB      Living  1     SAB      TAB      Ectopic      Multiple      Live Births  1           Past Medical History:  Diagnosis Date  . Anemia   . Hypertension     Past Surgical History:  Procedure Laterality Date  . NO PAST SURGERIES      Family History  Problem Relation Age of Onset  . Diabetes Brother     Social History   Tobacco Use  . Smoking status: Never Smoker  . Smokeless tobacco: Never Used  Substance Use Topics  . Alcohol use: Not Currently  . Drug use: Never    Allergies: No Known Allergies  Medications Prior to Admission  Medication Sig Dispense Refill Last Dose  . aspirin 81 MG chewable tablet Chew 81 mg by mouth daily.   Past Week at Unknown time  . ibuprofen (ADVIL,MOTRIN) 600 MG tablet Take 1 tablet (600 mg total) by mouth every 6 (six) hours as needed. 60 tablet 3 06/25/2018 at Unknown time  . labetalol (NORMODYNE) 100 MG tablet Take 1 tablet (100 mg total) by mouth 2 (two) times daily. 60 tablet 0 06/26/2018 at Unknown time  . misoprostol  (CYTOTEC) 200 MCG tablet Place 4 tablets in the vagina 4 tablet 0 Past Week at Unknown time  . oxyCODONE-acetaminophen (PERCOCET) 5-325 MG tablet Take 1 tablet by mouth every 4 (four) hours as needed for severe pain. 10 tablet 0 06/26/2018 at Unknown time  . Prenatal Multivit-Min-Fe-FA (PRENATAL VITAMINS) 0.8 MG tablet Take 1 tablet by mouth daily. 30 tablet 12 06/25/2018 at Unknown time    Review of Systems  Constitutional: Positive for chills and fever. Negative for fatigue.       Body aches  Respiratory: Negative.   Cardiovascular: Negative.   Gastrointestinal: Negative.   Genitourinary: Negative.   Musculoskeletal: Negative.   Neurological: Positive for headaches. Negative for dizziness, weakness and light-headedness.   Physical Exam   Patient Vitals for the past 24 hrs:  BP Temp Temp src Pulse Resp SpO2  06/26/18 2352 112/67 99.1 F (37.3 C) Oral (!) 101 20 -  06/26/18 2148 - 99.1 F (37.3 C) Oral - - -  06/26/18 2007 106/67 99.8 F (37.7 C) Oral (!) 103 18 100 %   Physical Exam  Nursing note and  vitals reviewed. Constitutional: She is oriented to person, place, and time. She appears well-developed and well-nourished. No distress.  Cardiovascular: Normal rate, regular rhythm and normal heart sounds.  No murmur heard. Respiratory: Effort normal and breath sounds normal. No respiratory distress. She has no wheezes. She has no rales.  GI: Soft. Bowel sounds are normal. She exhibits no distension. There is no abdominal tenderness. There is no rebound and no guarding.  Musculoskeletal: Normal range of motion.        General: No edema.  Neurological: She is alert and oriented to person, place, and time.  Skin: Skin is warm. She is diaphoretic. No pallor.  Psychiatric: She has a normal mood and affect. Her behavior is normal. Thought content normal.   MAU Course  Procedures  MDM Orders Placed This Encounter  Procedures  . Influenza panel by PCR (type A & B)   Treatments in  MAU included Tylenol 1000mg  for HA, patient reports no relief of HA with medication.   IV HA cocktail then ordered and given which included IV reglan, decadron and benadryl. Patient reports HA is resolved after treatment with HA cocktail.   Patient no longer diaphoretic or with fever after treatment. Results of Influenza swab pending, will call patient with results if positive. Patient verbalizes understanding. Encouraged to follow up as scheduled in the office and return to MAU as needed or if fever returns. Pt stable at time of discharge.   Assessment and Plan   1. Flu-like symptoms   2. SAB (spontaneous abortion)   3.      Intractable headache   Discharge home Results of influenza swab pending, will call patient with positive results and manage accordingly  Follow up as scheduled and return to MAU as needed   Follow-up Information    [facility].   Specialty:  Obstetrics and Gynecology Why:  Follow up as scheduled for miscarriage Contact information: [address] [place] [address] [phone]          Sharyon Cable CNM 06/26/2018, 11:06 PM

## 2018-06-26 NOTE — ED Notes (Signed)
Report called to Ginger, RN  

## 2018-07-06 ENCOUNTER — Other Ambulatory Visit: Payer: Self-pay

## 2018-07-06 ENCOUNTER — Ambulatory Visit (INDEPENDENT_AMBULATORY_CARE_PROVIDER_SITE_OTHER): Payer: Self-pay | Admitting: *Deleted

## 2018-07-06 ENCOUNTER — Other Ambulatory Visit: Payer: Self-pay | Admitting: *Deleted

## 2018-07-06 VITALS — BP 94/59 | HR 78 | Temp 98.4°F

## 2018-07-06 DIAGNOSIS — O039 Complete or unspecified spontaneous abortion without complication: Secondary | ICD-10-CM

## 2018-07-06 DIAGNOSIS — R519 Headache, unspecified: Secondary | ICD-10-CM

## 2018-07-06 DIAGNOSIS — R51 Headache: Secondary | ICD-10-CM

## 2018-07-06 NOTE — Progress Notes (Signed)
Here in lab for bhcg pnly and reported to lab that she has been having headache and  Dizzy x 2 weeks. Patient afebrile today. Per chart had similar symptoms at MAU visit 3/9 - had flu tests - which are negative. She reports she has been having intermittent headaches x 2 weeks that vary, currently =4. Taking ibuprofen with some relief.has been taking labetolol since she found out she was pregnant in early January. Reviewed patient chart, asssessment with Dr. Vergie Living. Informed patient per Dr. Vergie Living to stop labetolol , and restart lisinopril . Also to come back in one week for a nurse visit for bp check. She voices understanding.  Alexis Artist,RN

## 2018-07-07 LAB — BETA HCG QUANT (REF LAB): hCG Quant: 105 m[IU]/mL

## 2018-07-10 ENCOUNTER — Telehealth: Payer: Self-pay | Admitting: *Deleted

## 2018-07-10 NOTE — Telephone Encounter (Signed)
-----   Message from Hermina Staggers, MD sent at 07/07/2018  8:58 AM EDT ----- BHCG continues to decrease. F/U BHCG with MD appt in 2 weeks Thanks Casimiro Needle

## 2018-07-10 NOTE — Telephone Encounter (Signed)
I called Alexis Potts with [employer] [phone] and informed her that her last bhcg on 3/19 was 105 and per Dr.Ervin it is continuing to decrease and he reccommends another in 2 weeks. She agreed to 07/21/18 0815. She voices understanding.

## 2018-07-17 ENCOUNTER — Other Ambulatory Visit: Payer: Self-pay

## 2018-07-17 ENCOUNTER — Ambulatory Visit: Payer: Self-pay | Admitting: *Deleted

## 2018-07-17 VITALS — BP 100/62 | HR 72 | Temp 98.4°F

## 2018-07-17 DIAGNOSIS — I10 Essential (primary) hypertension: Secondary | ICD-10-CM

## 2018-07-17 MED ORDER — LISINOPRIL 10 MG PO TABS: 10.0000 mg | ORAL_TABLET | Freq: Every day | ORAL | 2 refills | Status: DC

## 2018-07-17 NOTE — Progress Notes (Signed)
Agree with A & P. 

## 2018-07-17 NOTE — Progress Notes (Signed)
I have reviewed the chart and agree with nursing staff's documentation of this patient's encounter.  Unadilla Bing, MD 07/17/2018 4:05 AM

## 2018-07-17 NOTE — Progress Notes (Signed)
Here for blood pressure check. States now on new medicine states feels much better- states only  Having very mild headache once every 3- 4 days and is relieved with tylenol. States she is almost out of lisinopril.  BP wnl. Discussed bp and assessment with Dr.Ervin and rx given for 3 months of lisinopril ; then to follow up with primary care for continued management of blood pressure and medications. She voices understanding. Also discussed will have televisit for 07/20/18 for fu sab. She voices understanding.  Sharlet Notaro,RN

## 2018-07-19 ENCOUNTER — Telehealth: Payer: Self-pay | Admitting: Obstetrics and Gynecology

## 2018-07-19 NOTE — Telephone Encounter (Signed)
Called the patient with the interrupter id [id number]. Called the to inform of the virtual visit scheduled for tomorrow due to the COVID restrictions.

## 2018-07-20 ENCOUNTER — Ambulatory Visit (INDEPENDENT_AMBULATORY_CARE_PROVIDER_SITE_OTHER): Payer: Self-pay | Admitting: Family Medicine

## 2018-07-20 DIAGNOSIS — O039 Complete or unspecified spontaneous abortion without complication: Secondary | ICD-10-CM

## 2018-07-20 DIAGNOSIS — I10 Essential (primary) hypertension: Secondary | ICD-10-CM

## 2018-07-20 DIAGNOSIS — Z789 Other specified health status: Secondary | ICD-10-CM

## 2018-07-20 MED ORDER — AMLODIPINE BESYLATE 5 MG PO TABS: 5.0000 mg | ORAL_TABLET | Freq: Every day | ORAL | 3 refills | Status: DC

## 2018-07-20 NOTE — Progress Notes (Signed)
[employer] [id number] 

## 2018-07-20 NOTE — Progress Notes (Signed)
   TELEHEALTH VIRTUAL GYNECOLOGY VISIT ENCOUNTER NOTE  I connected with Alexis Potts on 07/20/18 at 10:55 AM EDT by telephone at home and verified that I am speaking with the correct person using two identifiers.   I discussed the limitations, risks, security and privacy concerns of performing an evaluation and management service by telephone and the availability of in person appointments. I also discussed with the patient that there may be a patient responsible charge related to this service. The patient expressed understanding and agreed to proceed.   History:  Alexis Potts is a 33 y.o. G27P1001 female being evaluated today for follow up of SAB and HTN. Denies headache. Still taking lisinopril. She denies any abnormal vaginal discharge, bleeding, pelvic pain or other concerns.       Past Medical History:  Diagnosis Date  . Anemia   . Hypertension    Past Surgical History:  Procedure Laterality Date  . NO PAST SURGERIES     The following portions of the patient's history were reviewed and updated as appropriate: allergies, current medications, past family history, past medical history, past social history, past surgical history and problem list.   Health Maintenance:  None indicated at this time.   Review of Systems:  Pertinent items noted in HPI and remainder of comprehensive ROS otherwise negative.  Physical Exam:  Physical exam deferred due to nature of the encounter  Labs and Imaging No results found for this or any previous visit (from the past 336 hour(s)). US Ob Transvaginal  Result Date: 06/22/2018 CLINICAL DATA:  Pregnant, vaginal bleeding, beta HCG 3104 EXAM: TRANSVAGINAL OB ULTRASOUND TECHNIQUE: Transvaginal ultrasound was performed for complete evaluation of the gestation as well as the maternal uterus, adnexal regions, and pelvic cul-de-sac. COMPARISON:  None. FINDINGS: Intrauterine gestational sac: Not visualized Maternal uterus/adnexae:  Thickened and heterogeneous endometrium with mixed cystic and solid change. No IUP is visualized. Bilateral ovaries are within normal limits, noting a right corpus luteum. No pelvic fluid. IMPRESSION: No IUP is visualized. Technically speaking, this reflects a pregnancy of unknown location, with differential considerations including abnormal IUP/missed abortion (favored) versus nonvisualized ectopic pregnancy. Thickened heterogeneous endometrium, as described above, suggesting failed IUP/abortion in progress. Clinical follow-up with repeat beta HCG is suggested to confirm appropriate decline. Consider follow-up pelvic ultrasound in 7-10 days if clinically warranted. Electronically Signed   By: Charline Bills M.D.   On: 06/22/2018 14:54      Assessment and Plan:     1. SAB (spontaneous abortion) Continue PNV. Advised not to attempt pregnancy for 3 months.  2. Chronic hypertension Change to norvasc as patient desiring to get pregnant in 3 months.  3. Language barrier Interpreter used.      I discussed the assessment and treatment plan with the patient. The patient was provided an opportunity to ask questions and all were answered. The patient agreed with the plan and demonstrated an understanding of the instructions.   The patient was advised to call back or seek an in-person evaluation/go to the ED if the symptoms worsen or if the condition fails to improve as anticipated.  I provided 13 minutes of non-face-to-face time during this encounter.   Levie Heritage, DO Center for [employer], [facility]

## 2018-07-21 ENCOUNTER — Other Ambulatory Visit: Payer: Self-pay

## 2019-02-15 ENCOUNTER — Encounter: Payer: Self-pay | Admitting: General Practice

## 2019-02-21 ENCOUNTER — Encounter: Payer: Self-pay | Admitting: *Deleted

## 2019-03-02 ENCOUNTER — Telehealth: Payer: Self-pay | Admitting: Obstetrics & Gynecology

## 2019-03-02 NOTE — Telephone Encounter (Signed)
The patient stated she is running out of blood pressure medication. She visits the [facility] on [address].

## 2019-03-05 ENCOUNTER — Telehealth: Payer: Self-pay | Admitting: Family Medicine

## 2019-03-05 MED ORDER — AMLODIPINE BESYLATE 5 MG PO TABS: 5.0000 mg | ORAL_TABLET | Freq: Every day | ORAL | 3 refills | Status: DC

## 2019-03-05 NOTE — Telephone Encounter (Signed)
Spanish interpreter was used to communicate with patient. Patient called in stating that she has ran out of her BP meds as of Friday and she needs more pills. Patient stated she has an appointment on Wednesday. Patient instructed that I would put in a message to the nurses and someone will be reaching out to her. Patient instructed that if she does not here back from anyone before her appointment to address the concern at the appointment. Patient verbalized understanding and had no further questions.

## 2019-03-05 NOTE — Telephone Encounter (Signed)
Refill of Norvasc approved by Dr. Nehemiah Settle and sent to her preferred pharmacy. Pt was contacted w/[employer] interpreter # [phone] Nelva Bush) and was informed that her refill has been sent to her pharmacy. Pt was reminded of New Ob intake appt on 11/18 @ 0930 and that the appt will be a telephone visit and will take approximately 1 hour. Pt voiced understanding.

## 2019-03-05 NOTE — Addendum Note (Signed)
Addended by: Langston Reusing on: 03/05/2019 04:46 PM   Modules accepted: Orders

## 2019-03-06 ENCOUNTER — Telehealth: Payer: Self-pay | Admitting: Obstetrics and Gynecology

## 2019-03-06 NOTE — Telephone Encounter (Signed)
Spanish interpreter Eda attempted to call patient about her appointment on 11/18 @ 9:30. No answer, Eda left a voicemail instructing that patient that the visit is a telephone visit. Patient instructed that she does not have to come to the office for this appointment.

## 2019-03-07 ENCOUNTER — Ambulatory Visit (INDEPENDENT_AMBULATORY_CARE_PROVIDER_SITE_OTHER): Payer: Self-pay | Admitting: *Deleted

## 2019-03-07 ENCOUNTER — Other Ambulatory Visit: Payer: Self-pay

## 2019-03-07 DIAGNOSIS — O09899 Supervision of other high risk pregnancies, unspecified trimester: Secondary | ICD-10-CM

## 2019-03-07 DIAGNOSIS — O169 Unspecified maternal hypertension, unspecified trimester: Secondary | ICD-10-CM | POA: Insufficient documentation

## 2019-03-07 DIAGNOSIS — O10019 Pre-existing essential hypertension complicating pregnancy, unspecified trimester: Secondary | ICD-10-CM

## 2019-03-07 DIAGNOSIS — O0990 Supervision of high risk pregnancy, unspecified, unspecified trimester: Secondary | ICD-10-CM | POA: Insufficient documentation

## 2019-03-07 NOTE — Progress Notes (Signed)
I connected with  Abrianna Sidman on 03/07/19 at  9:30 AM EST by telephone and verified that I am speaking with the correct person using two identifiers.   I discussed the limitations, risks, security and privacy concerns of performing an evaluation and management service by telephone and the availability of in person appointments. I also discussed with the patient that there may be a patient responsible charge related to this service. The patient expressed understanding and agreed to proceed. Explained I am completing her New OB Intake today. We discussed Her EDD and that it is based on  sure LMP . I reviewed her allergies, meds, OB History, Medical /Surgical history, and appropriate screenings.   I explained we will give her a blood pressure cuff at her first ob appointment and  we will  show her how to use it. Explained  then we will have her take her blood pressure weekly . Explained she will have some visits in office and some virtually. I explained we will given her Web ex instructions when she comes to her first ob visit. . Reviewed appointment date/ time with her , our location and to wear mask, no visitors. Explained she will have exam, ob bloodwork, hemoglobin a1C, cbg , genetic testing if desired, pap if needed she thinks she had one at [facility] in the last 2 years.  I scheduled an [place] at 19 weeks. She voices understanding.   Rizwan Kuyper,RN 03/07/2019  9:31 AM

## 2019-03-07 NOTE — Patient Instructions (Signed)

## 2019-03-26 ENCOUNTER — Other Ambulatory Visit: Payer: Self-pay

## 2019-03-26 ENCOUNTER — Encounter: Payer: Self-pay | Admitting: Obstetrics and Gynecology

## 2019-03-26 ENCOUNTER — Ambulatory Visit (INDEPENDENT_AMBULATORY_CARE_PROVIDER_SITE_OTHER): Payer: Self-pay | Admitting: Obstetrics and Gynecology

## 2019-03-26 VITALS — BP 122/86 | HR 97 | Wt 148.9 lb

## 2019-03-26 DIAGNOSIS — O0990 Supervision of high risk pregnancy, unspecified, unspecified trimester: Secondary | ICD-10-CM

## 2019-03-26 DIAGNOSIS — Z3A12 12 weeks gestation of pregnancy: Secondary | ICD-10-CM

## 2019-03-26 DIAGNOSIS — Z113 Encounter for screening for infections with a predominantly sexual mode of transmission: Secondary | ICD-10-CM

## 2019-03-26 DIAGNOSIS — Z1151 Encounter for screening for human papillomavirus (HPV): Secondary | ICD-10-CM

## 2019-03-26 DIAGNOSIS — O0991 Supervision of high risk pregnancy, unspecified, first trimester: Secondary | ICD-10-CM

## 2019-03-26 DIAGNOSIS — Z124 Encounter for screening for malignant neoplasm of cervix: Secondary | ICD-10-CM

## 2019-03-26 DIAGNOSIS — O10011 Pre-existing essential hypertension complicating pregnancy, first trimester: Secondary | ICD-10-CM

## 2019-03-26 LAB — HEMOGLOBIN A1C
Est. average glucose Bld gHb Est-mCnc: 100 mg/dL
Hgb A1c MFr Bld: 5.1 % (ref 4.8–5.6)

## 2019-03-26 MED ORDER — ASPIRIN EC 81 MG PO TBEC: 81.0000 mg | DELAYED_RELEASE_TABLET | Freq: Every day | ORAL | 2 refills | Status: DC

## 2019-03-26 NOTE — Progress Notes (Signed)
Subjective:  Alexis Potts is a 33 y.o. G3P1011 at [redacted]w[redacted]d being seen today for her first OB visit. H/O CHTN, controlled with Norvasc. H/O first trimester SAB and TSVD in 2007 without problems.   She is currently monitored for the following issues for this high-risk pregnancy and has Hypertension in pregnancy and Supervision of high risk pregnancy, antepartum on their problem list.  Patient reports no complaints.  Contractions: Not present. Vag. Bleeding: None.  Movement: Absent. Denies leaking of fluid.   The following portions of the patient's history were reviewed and updated as appropriate: allergies, current medications, past family history, past medical history, past social history, past surgical history and problem list. Problem list updated.  Objective:   Vitals:   03/26/19 0901  BP: 122/86  Pulse: 97  Weight: 148 lb 14.4 oz (67.5 kg)    Fetal Status: Fetal Heart Rate (bpm): 153   Movement: Absent     General:  Alert, oriented and cooperative. Patient is in no acute distress.  Skin: Skin is warm and dry. No rash noted.   Cardiovascular: Normal heart rate noted  Respiratory: Normal respiratory effort, no problems with respiration noted  Abdomen: Soft, gravid, appropriate for gestational age. Pain/Pressure: Absent     Pelvic:  Cervical exam performed        Extremities: Normal range of motion.  Edema: None  Mental Status: Normal mood and affect. Normal behavior. Normal judgment and thought content.   Urinalysis:      Assessment and Plan:  Pregnancy: G3P1011 at [redacted]w[redacted]d  1. Supervision of high risk pregnancy, antepartum Prenatal care and labs reviewed with pt Genetic testing reviewed - Culture, OB Urine - Comprehensive metabolic panel - Genetic Screening - Obstetric Panel, Including HIV - Protein / creatinine ratio, urine - TSH - Cytology - PAP( [place]) - Hemoglobin A1c  2. Pre-existing essential hypertension during pregnancy in first trimester CHTN and  pregnancy reviewed with pt. BP monitoring reviewed with pt. Antenatal testing and growth scans reviewed with pt BASA qd indications reviewed - aspirin EC 81 MG tablet; Take 1 tablet (81 mg total) by mouth daily. Take after 12 weeks for prevention of preeclampsia later in pregnancy  Dispense: 300 tablet; Refill: 2  Live Interrupter used during today's visit   Preterm labor symptoms and general obstetric precautions including but not limited to vaginal bleeding, contractions, leaking of fluid and fetal movement were reviewed in detail with the patient. Please refer to After Visit Summary for other counseling recommendations.  Return in about 4 weeks (around 04/23/2019) for OB visit, face to face, MD provider.   Chancy Milroy, MD

## 2019-03-26 NOTE — Patient Instructions (Signed)
Primer trimestre de embarazo  First Trimester of Pregnancy    El primer trimestre de embarazo se extiende desde la semana 1 hasta el final de la semana 13 (mes 1 al mes 3). Durante este tiempo, el bebé comenzará a desarrollarse dentro suyo. Entre la semana 6 y la 8, se forman los ojos y el rostro, y los latidos del corazón pueden escucharse en la ecografía. Al final de las 12 semanas, todos los órganos del bebé están formados. El cuidado prenatal es toda la asistencia médica que usted recibe antes del nacimiento del bebé. Asegúrese de recibir un buen cuidado prenatal y de seguir todas las indicaciones del médico.  Siga estas indicaciones en su casa:  Medicamentos  · Tome los medicamentos de venta libre y los recetados solamente como se lo haya indicado el médico. Algunos medicamentos son seguros para tomar durante el embarazo y otros no lo son.  · Tome vitaminas prenatales que contengan por lo menos 600 microgramos (?g) de ácido fólico.  · Si tiene problemas para defecar (estreñimiento), tome un medicamento que ablanda la materia fecal (laxante), siempre que lo autorice el médico.  Comida y bebida    · Ingiera alimentos saludables de manera regular.  · El médico le indicará la cantidad de peso que puede aumentar.  · No coma carne cruda ni quesos sin cocinar.  · Si tiene malestar estomacal (náuseas) o vomita (vómitos):  ? Ingiera 4 o 5 comidas pequeñas por día en lugar de 3 abundantes.  ? Intente comer algunas galletitas saladas.  ? Beba líquidos entre las comidas, en lugar de hacerlo durante estas.  · Para evitar el estreñimiento:  ? Consuma alimentos ricos en fibra, como frutas y verduras frescas, cereales integrales y legumbres.  ? Beba suficiente líquido para mantener el pis (orina) claro o de color amarillo pálido.  Actividad  · Haga ejercicios solamente como se lo haya indicado el médico. Deje de hacer ejercicios si tiene cólicos o dolor en la parte baja del vientre (abdomen) o en la cintura.  · No haga  actividad física si el clima está demasiado caluroso o húmedo, o si se encuentra en un lugar muy alto (altitud elevada).  · Intente no estar de pie durante mucho tiempo. Mueva las piernas con frecuencia si debe estar de pie en un lugar durante mucho tiempo.  · Evite levantar pesos excesivos.  · Use zapatos con tacones bajos. Mantenga una buena postura al sentarse y pararse.  · Puede tener relaciones sexuales, a menos que el médico le indique lo contrario.  Alivio del dolor y del malestar  · Use un sostén que le brinde buen soporte si le duelen las mamas.  · Dese baños de asiento con agua tibia para aliviar el dolor o las molestias causadas por las hemorroides. Use una crema antihemorroidal si el médico se lo permite.  · Descanse con las piernas elevadas si tiene calambres o dolor de cintura.  · Si tiene las venas de las piernas hinchadas y abultadas (venas varicosas):  ? Use medias elásticas de soporte o medias de compresión como se lo haya indicado el médico.  ? Levante (eleve) los pies durante 15 minutos, 3 o 4 veces por día.  ? Limite la sal en sus alimentos.  Cuidado prenatal  · Programe las visitas prenatales para la semana 12 de embarazo.  · Escriba sus preguntas. Llévelas cuando concurra a las visitas prenatales.  · Concurra a todas las visitas prenatales como se lo haya indicado el médico. Esto es importante.  Seguridad  · Use el cinturón de seguridad en todo   momento mientras conduce.  · Haga una lista con los números de teléfono en caso de emergencia. Esta lista debe incluir los números de los familiares, los amigos, el hospital y los departamentos de policía y de bomberos.  Instrucciones generales  · Pídale al médico que la derive a clases prenatales en su localidad. Debe comenzar a tomar las clases antes de entrar en el mes 6 de embarazo.  · Pida ayuda si necesita asesoramiento o asistencia con la alimentación. El médico puede aconsejarla o indicarle dónde recurrir para recibir ayuda.  · No se dé baños de  inmersión en agua caliente, baños turcos ni saunas.  · No se haga duchas vaginales ni use tampones o toallas higiénicas perfumadas.  · No mantenga las piernas cruzadas durante mucho tiempo.  · Evite las hierbas y el alcohol. Evite los fármacos que el médico no haya autorizado.  · No consuma ningún producto que contenga tabaco, lo que incluye cigarrillos, tabaco de mascar o cigarrillos electrónicos. Si necesita ayuda para dejar de fumar, consulte al médico. Puede recibir asesoramiento u otro tipo de apoyo para dejar de fumar.  · Evite el contacto con las bandejas sanitarias de los gatos y la tierra que estos animales usan. Estos elementos contienen gérmenes que pueden causar defectos congénitos al bebé y la posible pérdida del feto (aborto espontáneo) o muerte fetal.  · Visite al dentista. En su casa, lávese los dientes con un cepillo dental suave. Pásese el hilo dental con suavidad.  Comuníquese con un médico si:  · Tiene mareos.  · Tiene cólicos leves o siente presión en la parte baja del vientre.  · Sufre un dolor persistente en el abdomen.  · Sigue teniendo malestar estomacal, vomita o la materia fecal es líquida (diarrea).  · Nota una secreción de líquido con olor fétido que proviene de la vagina.  · Tiene dolor al hacer pis (orinar).  · Tiene el rostro, las manos, las piernas o los tobillos más hinchados (inflamados).  Solicite ayuda de inmediato si:  · Tiene fiebre.  · Tiene una pérdida de líquido por la vagina.  · Tiene sangrado o pequeñas pérdidas vaginales.  · Tiene cólicos o dolor muy intensos en el vientre.  · Sube o baja de peso rápidamente.  · Vomita sangre. Esto tiene un aspecto similar a la borra del café.  · Está en contacto con personas que tienen rubéola, la quinta enfermedad o varicela.  · Siente un dolor de cabeza muy intenso.  · Le falta el aire.  · Sufre cualquier tipo de traumatismo, por ejemplo, debido a una caída o un accidente automovilístico.  Resumen  · El primer trimestre de embarazo se  extiende desde la semana 1 hasta el final de la semana 13 (mes 1 al mes 3).  · Para cuidar su salud y la del bebé en gestación, necesitará consumir alimentos saludables, tomar medicamentos solamente si lo autoriza el médico, y hacer actividades que sean seguras para usted y para su bebé.  · Concurra a todas las visitas de control como se lo haya indicado el médico. Esto es importante porque el médico deberá asegurar que el bebé esté saludable y esté creciendo bien.  Esta información no tiene como fin reemplazar el consejo del médico. Asegúrese de hacerle al médico cualquier pregunta que tenga.  Document Released: 07/02/2008 Document Revised: 11/09/2016 Document Reviewed: 11/09/2016  Elsevier Patient Education © 2020 Elsevier Inc.

## 2019-03-27 LAB — OBSTETRIC PANEL, INCLUDING HIV
Antibody Screen: NEGATIVE
Basophils Absolute: 0 10*3/uL (ref 0.0–0.2)
Basos: 0 %
EOS (ABSOLUTE): 0.1 10*3/uL (ref 0.0–0.4)
Eos: 2 %
HIV Screen 4th Generation wRfx: NONREACTIVE
Hematocrit: 39.5 % (ref 34.0–46.6)
Hemoglobin: 13.5 g/dL (ref 11.1–15.9)
Hepatitis B Surface Ag: NEGATIVE
Immature Grans (Abs): 0 10*3/uL (ref 0.0–0.1)
Immature Granulocytes: 0 %
Lymphocytes Absolute: 1.8 10*3/uL (ref 0.7–3.1)
Lymphs: 24 %
MCH: 30.8 pg (ref 26.6–33.0)
MCHC: 34.2 g/dL (ref 31.5–35.7)
MCV: 90 fL (ref 79–97)
Monocytes Absolute: 0.3 10*3/uL (ref 0.1–0.9)
Monocytes: 4 %
Neutrophils Absolute: 5.1 10*3/uL (ref 1.4–7.0)
Neutrophils: 70 %
Platelets: 286 10*3/uL (ref 150–450)
RBC: 4.38 x10E6/uL (ref 3.77–5.28)
RDW: 13.1 % (ref 11.7–15.4)
RPR Ser Ql: NONREACTIVE
Rh Factor: POSITIVE
Rubella Antibodies, IGG: 24.6 index (ref >0.99)
WBC: 7.3 10*3/uL (ref 3.4–10.8)

## 2019-03-27 LAB — CYTOLOGY - PAP
Chlamydia: NEGATIVE
Comment: NEGATIVE
Comment: NEGATIVE
Comment: NORMAL
Diagnosis: NEGATIVE
High risk HPV: NEGATIVE
Neisseria Gonorrhea: NEGATIVE

## 2019-03-27 LAB — COMPREHENSIVE METABOLIC PANEL
ALT: 13 IU/L (ref 0–32)
AST: 16 IU/L (ref 0–40)
Albumin/Globulin Ratio: 1.5 (ref 1.2–2.2)
Albumin: 4.2 g/dL (ref 3.8–4.8)
Alkaline Phosphatase: 41 IU/L (ref 39–117)
BUN/Creatinine Ratio: 11 (ref 9–23)
BUN: 5 mg/dL — ABNORMAL LOW (ref 6–20)
Bilirubin Total: 0.3 mg/dL (ref 0.0–1.2)
CO2: 18 mmol/L — ABNORMAL LOW (ref 20–29)
Calcium: 9.5 mg/dL (ref 8.7–10.2)
Chloride: 102 mmol/L (ref 96–106)
Creatinine, Ser: 0.46 mg/dL — ABNORMAL LOW (ref 0.57–1.00)
GFR calc Af Amer: 152 mL/min/{1.73_m2} (ref >59)
GFR calc non Af Amer: 132 mL/min/{1.73_m2} (ref >59)
Globulin, Total: 2.8 g/dL (ref 1.5–4.5)
Glucose: 81 mg/dL (ref 65–99)
Potassium: 3.8 mmol/L (ref 3.5–5.2)
Sodium: 135 mmol/L (ref 134–144)
Total Protein: 7 g/dL (ref 6.0–8.5)

## 2019-03-27 LAB — PROTEIN / CREATININE RATIO, URINE
Creatinine, Urine: 183.2 mg/dL
Protein, Ur: 16 mg/dL
Protein/Creat Ratio: 87 mg/g creat (ref 0–200)

## 2019-03-27 LAB — TSH: TSH: 1.6 u[IU]/mL (ref 0.450–4.500)

## 2019-03-29 ENCOUNTER — Telehealth: Payer: Self-pay

## 2019-03-29 LAB — URINE CULTURE, OB REFLEX

## 2019-03-29 LAB — CULTURE, OB URINE

## 2019-03-29 NOTE — Telephone Encounter (Signed)
-----   Message from Chancy Milroy, MD sent at 03/28/2019  6:25 PM EST ----- Please let pt know that her pap smear was negative  Thanks Legrand Como

## 2019-03-29 NOTE — Telephone Encounter (Signed)
Called Pt to advise of test results using [place] id # [phone], no naswer, left VM for pt to call back for test results.

## 2019-03-30 ENCOUNTER — Telehealth: Payer: Self-pay | Admitting: Obstetrics and Gynecology

## 2019-03-30 ENCOUNTER — Telehealth (INDEPENDENT_AMBULATORY_CARE_PROVIDER_SITE_OTHER): Payer: Self-pay | Admitting: Obstetrics & Gynecology

## 2019-03-30 DIAGNOSIS — O0990 Supervision of high risk pregnancy, unspecified, unspecified trimester: Secondary | ICD-10-CM

## 2019-03-30 NOTE — Telephone Encounter (Signed)
The patient stated she has her phone at work and cant answer. She would like a call back before 10am if possible, because she works from 10-5.

## 2019-03-30 NOTE — Telephone Encounter (Signed)
Pt called and was informed her lab work was normal. Pt. Voiced understanding.

## 2019-03-30 NOTE — Telephone Encounter (Signed)
Called pt with assistance of [place] # [phone] for testing results.

## 2019-03-30 NOTE — Telephone Encounter (Signed)
The patient called in to return the call back regarding her test results. Please call back with interpreter.

## 2019-04-02 ENCOUNTER — Encounter: Payer: Self-pay | Admitting: General Practice

## 2019-04-20 NOTE — L&D Delivery Note (Addendum)
OB/GYN Faculty Practice Delivery Note  Alexis Potts is a 34 y.o. G3P1011 s/p SVD at [redacted]w[redacted]d. She was admitted for IOL for cHTN..   ROM: 1h 33m with clear fluid GBS Status: Positive, received adequate treatment with penicillin Maximum Maternal Temperature: 98.7  Labor Progress: . Had a Foley bulb placed and received Cytotec.  Then started on pitocin.  Was AROMed with clear fluid.  Progressed to complete without complication.   Delivery Date/Time: 09/28/2019 at 0400 Delivery: Called to room and patient was complete and pushing. Head delivered LOA. No nuchal cord present. Shoulder and body delivered in usual fashion. Infant with spontaneous cry, placed on mother's abdomen, dried and stimulated. Cord clamped x 2 after 1-minute delay, and cut by FOB under my direct supervision. Cord blood drawn. Placenta delivered spontaneously with gentle cord traction. Fundus firm with massage and Pitocin. Brisk bleeding noted after delivery of placenta with firm fundus, but somewhat boggy lower uterine segment.  Bimanual massage was performed and uterus was swept for clots.  Given Cytotec 1000 mg per rectum.  Given EBL>500, also given TXA 1 g IV.  Labia, perineum, vagina, and cervix were inspected, found to have a first degree perineal laceration that was repaired with 3-0 Vicryl rapide in the usual fashion with good hemostasis noted.   Placenta: Intact, 3 vessel cord Complications: Bleeding as above Lacerations: First degree perineal EBL: 600 cc Analgesia: None for delivery.  Local analgesia with lidocaine for perineal repair.  Postpartum Planning [x]  message to sent to schedule follow-up  [x]  vaccines UTD  Infant: Viable female  APGARs 8/9  weight per medical record   EMILY , MD PGY-2 Resident, Family Medicine 09/28/2019, 4:43 AM   GME ATTESTATION:  I saw and evaluated the patient. I was gloved during the delivery and supervised the resident for both the delivery and repair. I agree  with the findings and the plan of care as documented in the resident's note.  Genene Churn, DO OB Fellow, Faculty [facility], Center for [facility] 09/28/2019 5:00 AM

## 2019-04-23 ENCOUNTER — Ambulatory Visit (INDEPENDENT_AMBULATORY_CARE_PROVIDER_SITE_OTHER): Payer: Self-pay | Admitting: Obstetrics & Gynecology

## 2019-04-23 ENCOUNTER — Other Ambulatory Visit: Payer: Self-pay

## 2019-04-23 ENCOUNTER — Other Ambulatory Visit: Payer: Self-pay | Admitting: Obstetrics & Gynecology

## 2019-04-23 ENCOUNTER — Other Ambulatory Visit (HOSPITAL_COMMUNITY): Payer: Self-pay | Admitting: *Deleted

## 2019-04-23 ENCOUNTER — Ambulatory Visit (HOSPITAL_COMMUNITY)
Admission: RE | Admit: 2019-04-23 | Discharge: 2019-04-23 | Disposition: A | Discharge status: Home / Self Care | Payer: Self-pay | Source: Ambulatory Visit | Attending: Obstetrics & Gynecology | Admitting: Obstetrics & Gynecology

## 2019-04-23 VITALS — BP 114/77 | HR 90 | Wt 149.3 lb

## 2019-04-23 DIAGNOSIS — O09899 Supervision of other high risk pregnancies, unspecified trimester: Secondary | ICD-10-CM

## 2019-04-23 DIAGNOSIS — O10011 Pre-existing essential hypertension complicating pregnancy, first trimester: Secondary | ICD-10-CM

## 2019-04-23 DIAGNOSIS — O0990 Supervision of high risk pregnancy, unspecified, unspecified trimester: Secondary | ICD-10-CM

## 2019-04-23 DIAGNOSIS — Z3A16 16 weeks gestation of pregnancy: Secondary | ICD-10-CM

## 2019-04-23 DIAGNOSIS — O09892 Supervision of other high risk pregnancies, second trimester: Secondary | ICD-10-CM

## 2019-04-23 DIAGNOSIS — Z789 Other specified health status: Secondary | ICD-10-CM

## 2019-04-23 DIAGNOSIS — O10019 Pre-existing essential hypertension complicating pregnancy, unspecified trimester: Secondary | ICD-10-CM

## 2019-04-23 DIAGNOSIS — O10919 Unspecified pre-existing hypertension complicating pregnancy, unspecified trimester: Secondary | ICD-10-CM

## 2019-04-23 DIAGNOSIS — O10012 Pre-existing essential hypertension complicating pregnancy, second trimester: Secondary | ICD-10-CM

## 2019-04-23 DIAGNOSIS — O0992 Supervision of high risk pregnancy, unspecified, second trimester: Secondary | ICD-10-CM

## 2019-04-23 NOTE — Progress Notes (Signed)
   PRENATAL VISIT NOTE  Subjective:  Alexis Potts is a 34 y.o. G3P1011 at [redacted]w[redacted]d being seen today for ongoing prenatal care.  She is currently monitored for the following issues for this high-risk pregnancy and has Hypertension in pregnancy and Supervision of high risk pregnancy, antepartum on their problem list.  Patient reports no complaints.  Contractions: Not present. Vag. Bleeding: None.  Movement: Absent. Denies leaking of fluid.   The following portions of the patient's history were reviewed and updated as appropriate: allergies, current medications, past family history, past medical history, past social history, past surgical history and problem list.   Objective:   Vitals:   04/23/19 0823  BP: 114/77  Pulse: 90  Weight: 149 lb 4.8 oz (67.7 kg)    Fetal Status:     Movement: Absent     General:  Alert, oriented and cooperative. Patient is in no acute distress.  Skin: Skin is warm and dry. No rash noted.   Cardiovascular: Normal heart rate noted  Respiratory: Normal respiratory effort, no problems with respiration noted  Abdomen: Soft, gravid, appropriate for gestational age.  Pain/Pressure: Present     Pelvic: Cervical exam deferred        Extremities: Normal range of motion.  Edema: None  Mental Status: Normal mood and affect. Normal behavior. Normal judgment and thought content.   Assessment and Plan:  Pregnancy: G3P1011 at [redacted]w[redacted]d 1. Supervision of high risk pregnancy, antepartum  - [place] MFM OB COMP + 14 WK; Future  2. Short interval between pregnancies affecting pregnancy, antepartum  3. Language barrier - Spanish interpretor   4. Pre-existing essential hypertension during pregnancy in first trimester - taking baby asa and norvasc  Preterm labor symptoms and general obstetric precautions including but not limited to vaginal bleeding, contractions, leaking of fluid and fetal movement were reviewed in detail with the patient. Please refer to After Visit  Summary for other counseling recommendations.   Return in about 4 weeks (around 05/21/2019) for virtual.  Future Appointments  Date Time Provider Department Center  05/14/2019 10:00 AM WH-MFC [place] 3 WH-MFCUS M[facility]-US  05/14/2019 10:10 AM WH-MFC NURSE [facility]-M[facility]-US    Allie Bossier, MD

## 2019-04-25 ENCOUNTER — Other Ambulatory Visit: Payer: Self-pay | Admitting: Obstetrics and Gynecology

## 2019-04-25 MED ORDER — PREPLUS 27-1 MG PO TABS: 1.0000 | ORAL_TABLET | Freq: Every day | ORAL | 6 refills | Status: DC

## 2019-04-25 NOTE — Telephone Encounter (Signed)
LM on pt's VM that have sent in Rx for PNV to her [facility] pharmacy on [address]  If she has any questions to please give the office a call.   Addison Naegeli, RN] 04/25/19

## 2019-04-25 NOTE — Telephone Encounter (Signed)
Patient called to say she needs a refill on a Rx. She is requesting a call before 10:00am, or tomorrow morning. When she is at work she can not answer the phone.

## 2019-05-02 ENCOUNTER — Encounter: Payer: Self-pay | Admitting: *Deleted

## 2019-05-14 ENCOUNTER — Ambulatory Visit (HOSPITAL_COMMUNITY): Payer: Self-pay

## 2019-05-14 ENCOUNTER — Other Ambulatory Visit (HOSPITAL_COMMUNITY): Payer: Self-pay

## 2019-05-21 ENCOUNTER — Ambulatory Visit (HOSPITAL_COMMUNITY)
Admission: RE | Admit: 2019-05-21 | Discharge: 2019-05-21 | Disposition: A | Discharge status: Home / Self Care | Payer: Self-pay | Source: Ambulatory Visit | Attending: Obstetrics and Gynecology | Admitting: Obstetrics and Gynecology

## 2019-05-21 ENCOUNTER — Other Ambulatory Visit (HOSPITAL_COMMUNITY): Payer: Self-pay | Admitting: *Deleted

## 2019-05-21 ENCOUNTER — Other Ambulatory Visit: Payer: Self-pay

## 2019-05-21 ENCOUNTER — Ambulatory Visit (HOSPITAL_COMMUNITY): Payer: Self-pay | Admitting: *Deleted

## 2019-05-21 ENCOUNTER — Encounter: Payer: Self-pay | Admitting: Family Medicine

## 2019-05-21 ENCOUNTER — Encounter (HOSPITAL_COMMUNITY): Payer: Self-pay

## 2019-05-21 ENCOUNTER — Ambulatory Visit (INDEPENDENT_AMBULATORY_CARE_PROVIDER_SITE_OTHER): Payer: Self-pay | Admitting: Family Medicine

## 2019-05-21 VITALS — BP 120/90 | HR 81

## 2019-05-21 DIAGNOSIS — Z362 Encounter for other antenatal screening follow-up: Secondary | ICD-10-CM

## 2019-05-21 DIAGNOSIS — O10012 Pre-existing essential hypertension complicating pregnancy, second trimester: Secondary | ICD-10-CM

## 2019-05-21 DIAGNOSIS — O10912 Unspecified pre-existing hypertension complicating pregnancy, second trimester: Secondary | ICD-10-CM

## 2019-05-21 DIAGNOSIS — O0990 Supervision of high risk pregnancy, unspecified, unspecified trimester: Secondary | ICD-10-CM

## 2019-05-21 DIAGNOSIS — O10919 Unspecified pre-existing hypertension complicating pregnancy, unspecified trimester: Secondary | ICD-10-CM | POA: Insufficient documentation

## 2019-05-21 DIAGNOSIS — Z3A20 20 weeks gestation of pregnancy: Secondary | ICD-10-CM

## 2019-05-21 DIAGNOSIS — O10011 Pre-existing essential hypertension complicating pregnancy, first trimester: Secondary | ICD-10-CM

## 2019-05-21 DIAGNOSIS — O0992 Supervision of high risk pregnancy, unspecified, second trimester: Secondary | ICD-10-CM

## 2019-05-21 NOTE — Progress Notes (Signed)
TELEHEALTH OBSTETRICS PRENATAL VIRTUAL VIDEO VISIT ENCOUNTER NOTE  Provider [facility]: [facility]   Spanish interpreter: [place]anca Potts  I connected with Alexis Bostonatalina Sanchez Potts on 05/21/19 at  8:15 AM EST by MyChart Video Encounter at home and verified that I am speaking with the correct person using two identifiers.   I discussed the limitations, risks, security and privacy concerns of performing an evaluation and management service virtually and the availability of in person appointments. I also discussed with the patient that there may be a patient responsible charge related to this service. The patient expressed understanding and agreed to proceed. Subjective:  Alexis Potts is a 34 y.o. G3P1011 at 7159w4d being seen today for ongoing prenatal care.  She is currently monitored for the following issues for this high-risk pregnancy and has Hypertension in pregnancy and Supervision of high risk pregnancy, antepartum on their problem list.  Patient reports no complaints.  Contractions: Not present. Vag. Bleeding: None.  Movement: Present. Denies any leaking of fluid.   The following portions of the patient's history were reviewed and updated as appropriate: allergies, current medications, past family history, past medical history, past social history, past surgical history and problem list.   Objective:   Vitals:   05/21/19 0822 05/21/19 0837  BP: (!) 125/91 120/90  Pulse: 85 81    Fetal Status:     Movement: Present     General:  Alert, oriented and cooperative. Patient is in no acute distress.  Respiratory: Normal respiratory effort, no problems with respiration noted  Mental Status: Normal mood and affect. Normal behavior. Normal judgment and thought content.  Rest of physical exam deferred due to type of encounter  Imaging: US MFM OB DETAIL +14 WK  Result Date:  04/23/2019 ----------------------------------------------------------------------  OBSTETRICS REPORT                       (Signed Final 04/23/2019 09:57 am) ---------------------------------------------------------------------- Patient Info  ID #:       [id number]                          D.O.B.:  Mar 14, 1986 (33 yrs)  Name:       Alexis FillerATALINA SANCHEZ                Visit Date: 04/23/2019 09:30 am              Potts ---------------------------------------------------------------------- Performed By  Performed By:     Alexis Lathearrie Potts BS      Ref. Address:     [address]                    [place] [address]                                                             [address]  Attending:        Noralee Spaceavi Shankar MD        [facility]  Referred By:      [facility] ---------------------------------------------------------------------- Orders   #  Description                          Code         Ordered By   1  [place] MFM OB DETAIL +14 WK              76811.01     MYRA DOVE  ----------------------------------------------------------------------   #  Order #                    Accession #                 Episode #   1  [id number]                  [id number]                  [id number]  ---------------------------------------------------------------------- Indications   Antenatal screening for malformations          Z36.3   Encounter for uncertain dates                  Z36.87   Hypertension - Chronic/Pre-existing (no        O10.019   meds)   [redacted] weeks gestation of pregnancy                Z3A.16  ---------------------------------------------------------------------- Fetal Evaluation  Num Of Fetuses:         1  Fetal Heart Rate(bpm):  149  Cardiac Activity:       Observed  Presentation:           Breech  Placenta:               Posterior  P. Cord Insertion:      Visualized  Amniotic Fluid  AFI FV:      Within normal limits                               Largest Pocket(cm)                              2.8 ---------------------------------------------------------------------- Biometry  BPD:      33.9  mm     G. Age:  16w 3d         44  %    CI:         68.7   %    70 - 86                                                          FL/HC:      16.1   %    14.6 - 17.6  HC:      130.7  mm     G. Age:  16w 5d         44  %    HC/AC:      1.18        1.07 - 1.29  AC:      110.7  mm     G. Age:  16w  6d         63  %    FL/BPD:     61.9   %  FL:         21  mm     G. Age:  16w 2d         33  %    FL/AC:      19.0   %    20 - 24  CER:      16.2  mm     G. Age:  16w 1d         42  %  NFT:       4.2  mm  CM:        4.5  mm  Est. FW:     163  gm      0 lb 6 oz     46  % ---------------------------------------------------------------------- OB History  Gravidity:    3         Term:   1        Prem:   0        SAB:   1  TOP:          0       Ectopic:  0        Living: 1 ---------------------------------------------------------------------- Gestational Age  LMP:           16w 2d        Date:  12/30/18                 EDD:   10/06/19  U/S Today:     16w 4d                                        EDD:   10/04/19  Best:          16w 4d     Det. By:  U/S (04/23/19)           EDD:   10/04/19 ---------------------------------------------------------------------- Anatomy  Cranium:               Appears normal         LVOT:                   Appears normal  Cavum:                 Appears normal         Aortic Arch:            Appears normal  Ventricles:            Appears normal         Ductal Arch:            Appears normal  Choroid Plexus:        Appears normal         Diaphragm:              Not well visualized  Cerebellum:            Appears normal         Stomach:                Appears normal, left  sided  Posterior Fossa:       Appears normal         Abdomen:                Appears normal  Nuchal Fold:            Appears normal         Abdominal Wall:         Appears nml (cord                                                                        insert, abd wall)  Face:                  Appears normal         Cord Vessels:           Appears normal (3                         (orbits and profile)                           vessel cord)  Lips:                  Not well visualized    Kidneys:                Appear normal  Palate:                Not well visualized    Bladder:                Appears normal  Thoracic:              Appears normal         Spine:                  Not well visualized  Heart:                 Appears normal         Upper Extremities:      Appears normal                         (4CH, axis, and                         situs)  RVOT:                  Not well visualized    Lower Extremities:      Appears normal  Other:  Heels/feet and open hands/5th digits visualized. Nasal bone          visualized. Technically difficult due to early gestational age. ---------------------------------------------------------------------- Cervix Uterus Adnexa  Cervix  Length:            3.6  cm.  Normal appearance by transabdominal scan.  Uterus  No abnormality visualized.  Left Ovary  Within normal limits.  Right Ovary  Not visualized.  Cul De Sac  No free fluid seen.  Adnexa  No abnormality visualized. ---------------------------------------------------------------------- Impression  G3 P1011. An emergency ultrasound was  requested because  of inability to find fetal heart activity on clinical evaluation at  your office. Patient is unsure of her LMP date.  Obstetric history is significant for a term vaginal delivery in  2007of a female infant weighing 3,317 g at birth. In addition,  she had one early SAB (05/2018).  Patient has chronic hypertension and takes amlodipine for  control.  BP at our office today is 114/77 mm Hg.  On cell-free fetal DNA screening, the risks of fetal  aneuploidies are not increased.  We performed a  fetal anatomy scan. Amniotic fluid is normal  and good fetal activity is seen. No markers of aneuploidies or  fetal structural defects are seen.Fetal anatomical survey is  limited because of early gestational age. Fetal biometry is  consistent with 16w 4d gestation. Patient understands the  limitations of ultrasound in detecting fetal anomalies.  We have assigned her EDD at 10/04/2019. ---------------------------------------------------------------------- Recommendations  -An appointment was made for her to return in 4 weeks for  completion of fetal anatomy.  -Fetal growth assessment every 4 weeks. ----------------------------------------------------------------------                  Noralee Space, MD Electronically Signed Final Report   04/23/2019 09:57 am ----------------------------------------------------------------------   Assessment and Plan:  Pregnancy: G3P1011 at [redacted]w[redacted]d 1. Pre-existing essential hypertension during pregnancy in first trimester On Norvasc, BP is ok, on ASA. Normal baseline labs.  2. Supervision of high risk pregnancy, antepartum F/u u/s with MFM today AFP draw with that appointment. - AFP, Serum, Open Spina Bifida Discussed staying safe from and treatment of COVID as well as vaccination during pregnancy.  Preterm labor symptoms and general obstetric precautions including but not limited to vaginal bleeding, contractions, leaking of fluid and fetal movement were reviewed in detail with the patient. I discussed the assessment and treatment plan with the patient. The patient was provided an opportunity to ask questions and all were answered. The patient agreed with the plan and demonstrated an understanding of the instructions. The patient was advised to call back or seek an in-person office evaluation/go to [facility] at [facility] for any urgent or concerning symptoms. Please refer to After Visit Summary for other counseling recommendations.   I provided 16 minutes  of face-to-face time during this encounter.  Return in 3 weeks (on 06/11/2019) for virtual.  Future Appointments  Date Time Provider Department Center  05/21/2019 10:40 AM [facility] NURSE [facility]-US  05/21/2019 10:45 AM WH-[facility] [place] 2 WH-MFCUS MFC-US    Reva Bores, MD [facility] for [facility]

## 2019-05-21 NOTE — Progress Notes (Signed)
I connected with  Alexis Potts on 05/21/19 at [phone] by telephone with interpreter [place] and verified that I am speaking with the correct person using two identifiers.   I discussed the limitations, risks, security and privacy concerns of performing an evaluation and management service by telephone and the availability of in person appointments. I also discussed with the patient that there may be a patient responsible charge related to this service. The patient expressed understanding and agreed to proceed.  Marjo Bicker, RN 05/21/2019  8:21 AM

## 2019-05-21 NOTE — Patient Instructions (Signed)

## 2019-05-23 LAB — AFP, SERUM, OPEN SPINA BIFIDA
AFP MoM: 0.96
AFP Value: 57 ng/mL
Gest. Age on Collection Date: 20.4 weeks
Maternal Age At EDD: 33.2 yr
OSBR Risk 1 IN: 10000
Test Results:: NEGATIVE
Weight: 152 [lb_av]

## 2019-06-11 ENCOUNTER — Ambulatory Visit (INDEPENDENT_AMBULATORY_CARE_PROVIDER_SITE_OTHER): Payer: Self-pay | Admitting: Family Medicine

## 2019-06-11 VITALS — BP 113/84 | HR 82

## 2019-06-11 DIAGNOSIS — O0990 Supervision of high risk pregnancy, unspecified, unspecified trimester: Secondary | ICD-10-CM

## 2019-06-11 DIAGNOSIS — O10012 Pre-existing essential hypertension complicating pregnancy, second trimester: Secondary | ICD-10-CM

## 2019-06-11 DIAGNOSIS — O0992 Supervision of high risk pregnancy, unspecified, second trimester: Secondary | ICD-10-CM

## 2019-06-11 DIAGNOSIS — Z3A23 23 weeks gestation of pregnancy: Secondary | ICD-10-CM

## 2019-06-11 NOTE — Patient Instructions (Signed)
 Lactancia materna Breastfeeding  Decidir amamantar es una de las mejores elecciones que puede hacer por usted y su beb. Un cambio en las hormonas durante el embarazo hace que las mamas produzcan leche materna en las glndulas productoras de leche. Las hormonas impiden que la leche materna sea liberada antes del nacimiento del beb. Adems, impulsan el flujo de leche luego del nacimiento. Una vez que ha comenzado a amamantar, pensar en el beb, as como la succin o el llanto, pueden estimular la liberacin de leche de las glndulas productoras de leche. Los beneficios de amamantar Las investigaciones demuestran que la lactancia materna ofrece muchos beneficios de salud para bebs y madres. Adems, ofrece una forma gratuita y conveniente de alimentar al beb. Para el beb  La primera leche (calostro) ayuda a mejorar el funcionamiento del aparato digestivo del beb.  Las clulas especiales de la leche (anticuerpos) ayudan a combatir las infecciones en el beb.  Los bebs que se alimentan con leche materna tambin tienen menos probabilidades de tener asma, alergias, obesidad o diabetes de tipo 2. Adems, tienen menor riesgo de sufrir el sndrome de muerte sbita del lactante (SMSL).  Los nutrientes de la leche materna son mejores para satisfacer las necesidades del beb en comparacin con la leche maternizada.  La leche materna mejora el desarrollo cerebral del beb. Para usted  La lactancia materna favorece el desarrollo de un vnculo muy especial entre la madre y el beb.  Es conveniente. La leche materna es econmica y siempre est disponible a la temperatura correcta.  La lactancia materna ayuda a quemar caloras. Le ayuda a perder el peso ganado durante el embarazo.  Hace que el tero vuelva al tamao que tena antes del embarazo ms rpido. Adems, disminuye el sangrado (loquios) despus del parto.  La lactancia materna contribuye a reducir el riesgo de tener diabetes de tipo 2,  osteoporosis, artritis reumatoide, enfermedades cardiovasculares y cncer de mama, ovario, tero y endometrio en el futuro. Informacin bsica sobre la lactancia Comienzo de la lactancia  Encuentre un lugar cmodo para sentarse o acostarse, con un buen respaldo para el cuello y la espalda.  Coloque una almohada o una manta enrollada debajo del beb para acomodarlo a la altura de la mama (si est sentada). Las almohadas para amamantar se han diseado especialmente a fin de servir de apoyo para los brazos y el beb mientras amamanta.  Asegrese de que la barriga del beb (abdomen) est frente a la suya.  Masajee suavemente la mama. Con las yemas de los dedos, masajee los bordes exteriores de la mama hacia adentro, en direccin al pezn. Esto estimula el flujo de leche. Si la leche fluye lentamente, es posible que deba continuar con este movimiento durante la lactancia.  Sostenga la mama con 4 dedos por debajo y el pulgar por arriba del pezn (forme la letra "C" con la mano). Asegrese de que los dedos se encuentren lejos del pezn y de la boca del beb.  Empuje suavemente los labios del beb con el pezn o con el dedo.  Cuando la boca del beb se abra lo suficiente, acrquelo rpidamente a la mama e introduzca todo el pezn y la arola, tanto como sea posible, dentro de la boca del beb. La arola es la zona de color que rodea al pezn. ? Debe haber ms arola visible por arriba del labio superior del beb que por debajo del labio inferior. ? Los labios del beb deben estar abiertos y extendidos hacia afuera (evertidos) para asegurar   que el beb se prenda de forma adecuada y cmoda. ? La lengua del beb debe estar entre la enca inferior y la mama.  Asegrese de que la boca del beb est en la posicin correcta alrededor del pezn (prendido). Los labios del beb deben crear un sello sobre la mama y estar doblados hacia afuera (invertidos).  Es comn que el beb succione durante 2 a 3 minutos  para que comience el flujo de leche materna. Cmo debe prenderse Es muy importante que le ensee al beb cmo prenderse adecuadamente a la mama. Si el beb no se prende adecuadamente, puede causar dolor en los pezones, reducir la produccin de leche materna y hacer que el beb tenga un escaso aumento de peso. Adems, si el beb no se prende adecuadamente al pezn, puede tragar aire durante la alimentacin. Esto puede causarle molestias al beb. Hacer eructar al beb al cambiar de mama puede ayudarlo a liberar el aire. Sin embargo, ensearle al beb cmo prenderse a la mama adecuadamente es la mejor manera de evitar que se sienta molesto por tragar aire mientras se alimenta. Signos de que el beb se ha prendido adecuadamente al pezn  Tironea o succiona de modo silencioso, sin causarle dolor. Los labios del beb deben estar extendidos hacia afuera (evertidos).  Se escucha que traga cada 3 o 4 succiones una vez que la leche ha comenzado a fluir (despus de que se produzca el reflejo de eyeccin de la leche).  Hay movimientos musculares por arriba y por delante de sus odos al succionar. Signos de que el beb no se ha prendido adecuadamente al pezn  Hace ruidos de succin o de chasquido mientras se alimenta.  Siente dolor en los pezones. Si cree que el beb no se prendi correctamente, deslice el dedo en la comisura de la boca y colquelo entre las encas del beb para interrumpir la succin. Intente volver a comenzar a amamantar. Signos de lactancia materna exitosa Signos del beb  El beb disminuir gradualmente el nmero de succiones o dejar de succionar por completo.  El beb se quedar dormido.  El cuerpo del beb se relajar.  El beb retendr una pequea cantidad de leche en la boca.  El beb se desprender solo del pecho. Signos que presenta usted  Las mamas han aumentado la firmeza, el peso y el tamao 1 a 3 horas despus de amamantar.  Estn ms blandas inmediatamente despus  de amamantar.  Se producen un aumento del volumen de leche y un cambio en su consistencia y color hacia el quinto da de lactancia.  Los pezones no duelen, no estn agrietados ni sangran. Signos de que su beb recibe la cantidad de leche suficiente  Mojar por lo menos 1 o 2paales durante las primeras 24horas despus del nacimiento.  Mojar por lo menos 5 o 6paales cada 24horas durante la primera semana despus del nacimiento. La orina debe ser clara o de color amarillo plido a los 5das de vida.  Mojar entre 6 y 8paales cada 24horas a medida que el beb sigue creciendo y desarrollndose.  Defeca por lo menos 3 veces en 24 horas a los 5 das de vida. Las heces deben ser blandas y amarillentas.  Defeca por lo menos 3 veces en 24 horas a los 7 das de vida. Las heces deben ser grumosas y amarillentas.  No registra una prdida de peso mayor al 10% del peso al nacer durante los primeros 3 das de vida.  Aumenta de peso un promedio de 4   a 7onzas (113 a 198g) por semana despus de los 4 das de vida.  Aumenta de peso, diariamente, de manera uniforme a partir de los 5 das de vida, sin registrar prdida de peso despus de las 2semanas de vida. Despus de alimentarse, es posible que el beb regurgite una pequea cantidad de leche. Esto es normal. Frecuencia y duracin de la lactancia El amamantamiento frecuente la ayudar a producir ms leche y puede prevenir dolores en los pezones y las mamas extremadamente llenas (congestin mamaria). Alimente al beb cuando muestre signos de hambre o si siente la necesidad de reducir la congestin de las mamas. Esto se denomina "lactancia a demanda". Las seales de que el beb tiene hambre incluyen las siguientes:  Aumento del estado de alerta, actividad o inquietud.  Mueve la cabeza de un lado a otro.  Abre la boca cuando se le toca la mejilla o la comisura de la boca (reflejo de bsqueda).  Aumenta las vocalizaciones, tales como sonidos de  succin, se relame los labios, emite arrullos, suspiros o chirridos.  Mueve la mano hacia la boca y se chupa los dedos o las manos.  Est molesto o llora. Evite el uso del chupete en las primeras 4 a 6 semanas despus del nacimiento del beb. Despus de este perodo, podr usar un chupete. Las investigaciones demostraron que el uso del chupete durante el primer ao de vida del beb disminuye el riesgo de tener el sndrome de muerte sbita del lactante (SMSL). Permita que el nio se alimente en cada mama todo lo que desee. Cuando el beb se desprende o se queda dormido mientras se est alimentando de la primera mama, ofrzcale la segunda. Debido a que, con frecuencia, los recin nacidos estn somnolientos las primeras semanas de vida, es posible que deba despertar al beb para alimentarlo. Los horarios de lactancia varan de un beb a otro. Sin embargo, las siguientes reglas pueden servir como gua para ayudarla a garantizar que el beb se alimenta adecuadamente:  Se puede amamantar a los recin nacidos (bebs de 4 semanas o menos de vida) cada 1 a 3 horas.  No deben transcurrir ms de 3 horas durante el da o 5 horas durante la noche sin que se amamante a los recin nacidos.  Debe amamantar al beb un mnimo de 8 veces en un perodo de 24 horas. Extraccin de leche materna     La extraccin y el almacenamiento de la leche materna le permiten asegurarse de que el beb se alimente exclusivamente de su leche materna, aun en momentos en los que no puede amamantar. Esto tiene especial importancia si debe regresar al trabajo en el perodo en que an est amamantando o si no puede estar presente en los momentos en que el beb debe alimentarse. Su asesor en lactancia puede ayudarla a encontrar un mtodo de extraccin que funcione mejor para usted y orientarla sobre cunto tiempo es seguro almacenar leche materna. Cmo cuidar las mamas durante la lactancia Los pezones pueden secarse, agrietarse y doler  durante la lactancia. Las siguientes recomendaciones pueden ayudarla a mantener las mamas humectadas y sanas:  Evite usar jabn en los pezones.  Use un sostn de soporte diseado especialmente para la lactancia materna. Evite usar sostenes con aro o sostenes muy ajustados (sostenes deportivos).  Seque al aire sus pezones durante 3 a 4minutos despus de amamantar al beb.  Utilice solo apsitos de algodn en el sostn para absorber las prdidas de leche. La prdida de un poco de leche materna entre   las tomas es normal.  Utilice lanolina sobre los pezones luego de amamantar. La lanolina ayuda a mantener la humedad normal de la piel. La lanolina pura no es perjudicial (no es txica) para el beb. Adems, puede extraer manualmente algunas gotas de leche materna y masajear suavemente esa leche sobre los pezones para que la leche se seque al aire. Durante las primeras semanas despus del nacimiento, algunas mujeres experimentan congestin mamaria. La congestin mamaria puede hacer que sienta las mamas pesadas, calientes y sensibles al tacto. El pico de la congestin mamaria ocurre en el plazo de los 3 a 5 das despus del parto. Las siguientes recomendaciones pueden ayudarla a aliviar la congestin mamaria:  Vace por completo las mamas al amamantar o extraer leche. Puede aplicar calor hmedo en las mamas (en la ducha o con toallas hmedas para manos) antes de amamantar o extraer leche. Esto aumenta la circulacin y ayuda a que la leche fluya. Si el beb no vaca por completo las mamas cuando lo amamanta, extraiga la leche restante despus de que haya finalizado.  Aplique compresas de hielo sobre las mamas inmediatamente despus de amamantar o extraer leche, a menos que le resulte demasiado incmodo. Haga lo siguiente: ? Ponga el hielo en una bolsa plstica. ? Coloque una toalla entre la piel y la bolsa de hielo. ? Coloque el hielo durante 20minutos, 2 o 3veces por da.  Asegrese de que el beb  est prendido y se encuentre en la posicin correcta mientras lo alimenta. Si la congestin mamaria persiste luego de 48 horas o despus de seguir estas recomendaciones, comunquese con su mdico o un asesor en lactancia. Recomendaciones de salud general durante la lactancia  Consuma 3 comidas y 3 colaciones saludables todos los das. Las madres bien alimentadas que amamantan necesitan entre 450 y 500 caloras adicionales por da. Puede cumplir con este requisito al aumentar la cantidad de una dieta equilibrada que realice.  Beba suficiente agua para mantener la orina clara o de color amarillo plido.  Descanse con frecuencia, reljese y siga tomando sus vitaminas prenatales para prevenir la fatiga, el estrs y los niveles bajos de vitaminas y minerales en el cuerpo (deficiencias de nutrientes).  No consuma ningn producto que contenga nicotina o tabaco, como cigarrillos y cigarrillos electrnicos. El beb puede verse afectado por las sustancias qumicas de los cigarrillos que pasan a la leche materna y por la exposicin al humo ambiental del tabaco. Si necesita ayuda para dejar de fumar, consulte al mdico.  Evite el consumo de alcohol.  No consuma drogas ilegales o marihuana.  Antes de usar cualquier medicamento, hable con el mdico. Estos incluyen medicamentos recetados y de venta libre, como tambin vitaminas y suplementos a base de hierbas. Algunos medicamentos, que pueden ser perjudiciales para el beb, pueden pasar a travs de la leche materna.  Puede quedar embarazada durante la lactancia. Si se desea un mtodo anticonceptivo, consulte al mdico sobre cules son las opciones seguras durante la lactancia. Dnde encontrar ms informacin: Liga internacional La Leche: www.llli.org. Comunquese con un mdico si:  Siente que quiere dejar de amamantar o se siente frustrada con la lactancia.  Sus pezones estn agrietados o sangran.  Sus mamas estn irritadas, sensibles o  calientes.  Tiene los siguientes sntomas: ? Dolor en las mamas o en los pezones. ? Un rea hinchada en cualquiera de las mamas. ? Fiebre o escalofros. ? Nuseas o vmitos. ? Drenaje de otro lquido distinto de la leche materna desde los pezones.  Sus mamas no   se llenan antes de amamantar al beb para el quinto da despus del parto.  Se siente triste y deprimida.  El beb: ? Est demasiado somnoliento como para comer bien. ? Tiene problemas para dormir. ? Tiene ms de 1 semana de vida y moja menos de 6 paales en un periodo de 24 horas. ? No ha aumentado de peso a los 5 das de vida.  El beb defeca menos de 3 veces en 24 horas.  La piel del beb o las partes blancas de los ojos se vuelven amarillentas. Solicite ayuda de inmediato si:  El beb est muy cansado (letargo) y no se quiere despertar para comer.  Le sube la fiebre sin causa. Resumen  La lactancia materna ofrece muchos beneficios de salud para bebs y madres.  Intente amamantar a su beb cuando muestre signos tempranos de hambre.  Haga cosquillas o empuje suavemente los labios del beb con el dedo o el pezn para lograr que el beb abra la boca. Acerque el beb a la mama. Asegrese de que la mayor parte de la arola se encuentre dentro de la boca del beb. Ofrzcale una mama y haga eructar al beb antes de pasar a la otra.  Hable con su mdico o asesor en lactancia si tiene dudas o problemas con la lactancia. Esta informacin no tiene como fin reemplazar el consejo del mdico. Asegrese de hacerle al mdico cualquier pregunta que tenga. Document Revised: 06/30/2017 Document Reviewed: 07/26/2016 Elsevier Patient Education  2020 Elsevier Inc.  

## 2019-06-11 NOTE — Progress Notes (Signed)
TELEHEALTH OBSTETRICS PRENATAL VIRTUAL VIDEO VISIT ENCOUNTER NOTE  Provider location: [facility] [place]   Spanish interpreter: Raquel used  I connected with Alexis Potts on 06/11/19 at  8:15 AM EST by MyChart Video Encounter at home and verified that I am speaking with the correct person using two identifiers.   I discussed the limitations, risks, security and privacy concerns of performing an evaluation and management service virtually and the availability of in person appointments. I also discussed with the patient that there may be a patient responsible charge related to this service. The patient expressed understanding and agreed to proceed. Subjective:  Alexis Potts is a 34 y.o. G3P1011 at [redacted]w[redacted]d being seen today for ongoing prenatal care.  She is currently monitored for the following issues for this high-risk pregnancy and has Hypertension in pregnancy and Supervision of high risk pregnancy, antepartum on their problem list.  Patient reports no complaints.  Contractions: Not present. Vag. Bleeding: None.  Movement: Present. Denies any leaking of fluid.   The following portions of the patient's history were reviewed and updated as appropriate: allergies, current medications, past family history, past medical history, past social history, past surgical history and problem list.   Objective:   Vitals:   06/11/19 0812  BP: 113/84  Pulse: 82    Fetal Status:     Movement: Present     General:  Alert, oriented and cooperative. Patient is in no acute distress.  Respiratory: Normal respiratory effort, no problems with respiration noted  Mental Status: Normal mood and affect. Normal behavior. Normal judgment and thought content.  Rest of physical exam deferred due to type of encounter  Imaging: [place] MFM OB FOLLOW UP  Result Date: 05/21/2019 ----------------------------------------------------------------------  OBSTETRICS REPORT                        (Signed Final 05/21/2019 12:06 pm) ---------------------------------------------------------------------- Patient Info  ID #:       [id number]                          D.O.B.:  [birth year] (33 yrs)  Name:       Alexis Potts                Visit Date: 05/21/2019 10:45 am              GONZALEZ ---------------------------------------------------------------------- Performed By  Performed By:     Sandi Mealy        Ref. Address:     [address]                    [place]                                                             [address]  Attending:        Noralee Space MD        [facility]  Referred By:      [facility] ---------------------------------------------------------------------- Orders   #  Description                          Code         Ordered By   1  [place] MFM OB FOLLOW UP                  [id number]     RAVI SHANKAR  ----------------------------------------------------------------------   #  Order #                    Accession #                 Episode #   1  [id number]                  [id number]                  [id number]  ---------------------------------------------------------------------- Indications   Encounter for other antenatal screening        Z36.2   follow-up   Hypertension - Chronic/Pre-existing            O10.019   (Amlodipine)   [redacted] weeks gestation of pregnancy                Z3A.20  ---------------------------------------------------------------------- Fetal Evaluation  Num Of Fetuses:         1  Cardiac Activity:       Observed  Presentation:           Cephalic  Placenta:               Posterior  P. Cord Insertion:      Previously Visualized  Amniotic Fluid  AFI FV:      Within normal limits                              Largest Pocket(cm)                              5.41 ---------------------------------------------------------------------- Biometry  BPD:        47  mm     G. Age:   20w 1d         34  %    CI:        69.74   %    70 - 86                                                          FL/HC:      17.9   %    15.9 - 20.3  HC:      179.6  mm     G. Age:  20w 3d         33  %    HC/AC:      1.15        1.06 - 1.25  AC:      156.3  mm     G. Age:  20w 6d         51  %    FL/BPD:  68.5   %  FL:       32.2  mm     G. Age:  20w 0d         24  %    FL/AC:      20.6   %    20 - 24  HUM:      30.8  mm     G. Age:  20w 2d         40  %  LV:        4.6  mm  Est. FW:     353  gm    0 lb 12 oz      37  % ---------------------------------------------------------------------- OB History  Gravidity:    3         Term:   1        Prem:   0        SAB:   1  TOP:          0       Ectopic:  0        Living: 1 ---------------------------------------------------------------------- Gestational Age  LMP:           20w 2d        Date:  12/30/18                 EDD:   10/06/19  U/S Today:     20w 3d                                        EDD:   10/05/19  Best:          20w 4d     Det. By:  U/S  (04/23/19)          EDD:   10/04/19 ---------------------------------------------------------------------- Anatomy  Cranium:               Appears normal         Aortic Arch:            Previously seen  Cavum:                 Appears normal         Ductal Arch:            Previously seen  Ventricles:            Appears normal         Diaphragm:              Appears normal  Choroid Plexus:        Previously seen        Stomach:                Appears normal, left                                                                        sided  Cerebellum:            Previously seen        Abdomen:  Appears normal  Posterior Fossa:       Previously seen        Abdominal Wall:         Previously seen  Nuchal Fold:           Not applicable (>20    Cord Vessels:           Previously seen                         wks GA)  Face:                  Orbits and profile     Kidneys:                Appear normal                          previously seen  Lips:                  Appears normal         Bladder:                Appears normal  Thoracic:              Appears normal         Spine:                  Appears normal  Heart:                 Previously seen        Upper Extremities:      Previously seen  RVOT:                  Not well visualized    Lower Extremities:      Previously seen  LVOT:                  Previously seen ---------------------------------------------------------------------- Cervix Uterus Adnexa  Cervix  Length:           3.73  cm.  Normal appearance by transabdominal scan. ---------------------------------------------------------------------- Impression  Chronic hypertension.  Patient takes amlodipine for control of  her blood pressures.  BP today at our office is 124/80 mmHg.  Amniotic fluid is normal and good fetal activity is seen. Fetal  growth is appropriate for gestational age. Fetal anatomical  survey was completed and appears normal.  On  transabdominal scan, the placenta is not low-lying.  We reassured the patient of the findings. ---------------------------------------------------------------------- Recommendations  -An appointment was made for her to return in 4 weeks for  fetal growth assessment (evaluate RVOT). ----------------------------------------------------------------------                  Noralee Space, MD Electronically Signed Final Report   05/21/2019 12:06 pm ----------------------------------------------------------------------   Assessment and Plan:  Pregnancy: G3P1011 at [redacted]w[redacted]d 1. Pre-existing essential hypertension during pregnancy in second trimester BP is well controlled on Norvasc and continue ASA  2. Supervision of high risk pregnancy, antepartum Normal anatomy u/s 28 wk labs next  Preterm labor symptoms and general obstetric precautions including but not limited to vaginal bleeding, contractions, leaking of fluid and fetal movement were reviewed in detail with the  patient. I discussed the assessment and treatment plan with the patient. The patient was provided an opportunity to ask questions and all were answered. The patient agreed with the plan and demonstrated an  understanding of the instructions. The patient was advised to call back or seek an in-person office evaluation/go to [facility]U at [facility] for any urgent or concerning symptoms. Please refer to After Visit Summary for other counseling recommendations.   I provided 8 minutes of face-to-face time during this encounter.  Return in 4 weeks (on 07/09/2019) for [facility], in person, 28 wk labs.  Future Appointments  Date Time Provider Department Center  06/18/2019 11:00 AM WH-MFC NURSE WH-MFC M[facility]-US  06/18/2019 11:00 AM WH-MFC [place] 3 WH-MFCUS MFC-US    Reva Bores, MD [facility], [facility]

## 2019-06-11 NOTE — Progress Notes (Signed)
I connected with  [name] on 06/11/19 at  8:15 AM EST by telephone with [facility] interpreter Casimiro Needle ID [phone] and verified that I am speaking with the correct person using two identifiers.   I discussed the limitations, risks, security and privacy concerns of performing an evaluation and management service by telephone and the availability of in person appointments. I also discussed with the patient that there may be a patient responsible charge related to this service. The patient expressed understanding and agreed to proceed.  Marjo Bicker, RN 06/11/2019  8:11 AM

## 2019-06-18 ENCOUNTER — Ambulatory Visit (HOSPITAL_COMMUNITY): Payer: Self-pay | Admitting: *Deleted

## 2019-06-18 ENCOUNTER — Encounter (HOSPITAL_COMMUNITY): Payer: Self-pay

## 2019-06-18 ENCOUNTER — Other Ambulatory Visit (HOSPITAL_COMMUNITY): Payer: Self-pay | Admitting: *Deleted

## 2019-06-18 ENCOUNTER — Other Ambulatory Visit: Payer: Self-pay

## 2019-06-18 ENCOUNTER — Ambulatory Visit (HOSPITAL_COMMUNITY)
Admission: RE | Admit: 2019-06-18 | Discharge: 2019-06-18 | Disposition: A | Discharge status: Home / Self Care | Payer: Self-pay | Source: Ambulatory Visit | Attending: Obstetrics and Gynecology | Admitting: Obstetrics and Gynecology

## 2019-06-18 DIAGNOSIS — O10012 Pre-existing essential hypertension complicating pregnancy, second trimester: Secondary | ICD-10-CM | POA: Insufficient documentation

## 2019-06-18 DIAGNOSIS — Z3A24 24 weeks gestation of pregnancy: Secondary | ICD-10-CM

## 2019-06-18 DIAGNOSIS — O0990 Supervision of high risk pregnancy, unspecified, unspecified trimester: Secondary | ICD-10-CM

## 2019-06-18 DIAGNOSIS — O10912 Unspecified pre-existing hypertension complicating pregnancy, second trimester: Secondary | ICD-10-CM | POA: Insufficient documentation

## 2019-06-18 DIAGNOSIS — Z362 Encounter for other antenatal screening follow-up: Secondary | ICD-10-CM

## 2019-06-18 DIAGNOSIS — O10919 Unspecified pre-existing hypertension complicating pregnancy, unspecified trimester: Secondary | ICD-10-CM

## 2019-07-04 ENCOUNTER — Other Ambulatory Visit: Payer: Self-pay

## 2019-07-04 DIAGNOSIS — O0990 Supervision of high risk pregnancy, unspecified, unspecified trimester: Secondary | ICD-10-CM

## 2019-07-04 NOTE — Addendum Note (Signed)
Addended by: Maxwell Marion E on: 07/04/2019 02:19 PM   Modules accepted: Orders

## 2019-07-09 ENCOUNTER — Other Ambulatory Visit: Payer: Self-pay

## 2019-07-09 ENCOUNTER — Ambulatory Visit (INDEPENDENT_AMBULATORY_CARE_PROVIDER_SITE_OTHER): Payer: Self-pay | Admitting: Family Medicine

## 2019-07-09 VITALS — BP 129/85 | HR 96 | Wt 157.0 lb

## 2019-07-09 DIAGNOSIS — O10012 Pre-existing essential hypertension complicating pregnancy, second trimester: Secondary | ICD-10-CM

## 2019-07-09 DIAGNOSIS — Z789 Other specified health status: Secondary | ICD-10-CM

## 2019-07-09 DIAGNOSIS — Z23 Encounter for immunization: Secondary | ICD-10-CM

## 2019-07-09 DIAGNOSIS — O0990 Supervision of high risk pregnancy, unspecified, unspecified trimester: Secondary | ICD-10-CM

## 2019-07-09 DIAGNOSIS — Z3A27 27 weeks gestation of pregnancy: Secondary | ICD-10-CM

## 2019-07-09 DIAGNOSIS — I10 Essential (primary) hypertension: Secondary | ICD-10-CM

## 2019-07-09 NOTE — Progress Notes (Signed)
   PRENATAL VISIT NOTE  Subjective:  Alexis Potts is a 34 y.o. G3P1011 at [redacted]w[redacted]d being seen today for ongoing prenatal care.  She is currently monitored for the following issues for this high-risk pregnancy and has Hypertension in pregnancy and Supervision of high risk pregnancy, antepartum on their problem list.  Patient reports no complaints.  Contractions: Not present. Vag. Bleeding: None.  Movement: Present. Denies leaking of fluid.   The following portions of the patient's history were reviewed and updated as appropriate: allergies, current medications, past family history, past medical history, past social history, past surgical history and problem list.   Objective:   Vitals:   07/09/19 0946  BP: 129/85  Pulse: 96  Weight: 157 lb (71.2 kg)    Fetal Status: Fetal Heart Rate (bpm): 157 Fundal Height: 28 cm Movement: Present     General:  Alert, oriented and cooperative. Patient is in no acute distress.  Skin: Skin is warm and dry. No rash noted.   Cardiovascular: Normal heart rate noted  Respiratory: Normal respiratory effort, no problems with respiration noted  Abdomen: Soft, gravid, appropriate for gestational age.  Pain/Pressure: Absent     Pelvic: Cervical exam deferred        Extremities: Normal range of motion.  Edema: None  Mental Status: Normal mood and affect. Normal behavior. Normal judgment and thought content.   Assessment and Plan:  Pregnancy: G3P1011 at [redacted]w[redacted]d 1. Supervision of high risk pregnancy, antepartum 2hr GTT today. Tdap today. FHT and Fh normal  2. Chronic hypertension Controlled. On amlodipine  3. Language barrier Interpreter used   Preterm labor symptoms and general obstetric precautions including but not limited to vaginal bleeding, contractions, leaking of fluid and fetal movement were reviewed in detail with the patient. Please refer to After Visit Summary for other counseling recommendations.   No follow-ups on file.  Future  Appointments  Date Time Provider Department Center  07/16/2019  8:45 AM [facility] NURSE [facility]  07/16/2019  8:45 AM [facility] [place] 2 [facility]    Levie Heritage, DO

## 2019-07-10 LAB — CBC
Hematocrit: 35.9 % (ref 34.0–46.6)
Hemoglobin: 12.2 g/dL (ref 11.1–15.9)
MCH: 32 pg (ref 26.6–33.0)
MCHC: 34 g/dL (ref 31.5–35.7)
MCV: 94 fL (ref 79–97)
Platelets: 233 10*3/uL (ref 150–450)
RBC: 3.81 x10E6/uL (ref 3.77–5.28)
RDW: 13.4 % (ref 11.7–15.4)
WBC: 6.3 10*3/uL (ref 3.4–10.8)

## 2019-07-10 LAB — GLUCOSE TOLERANCE, 2 HOURS W/ 1HR
Glucose, 1 hour: 172 mg/dL (ref 65–179)
Glucose, 2 hour: 95 mg/dL (ref 65–152)
Glucose, Fasting: 78 mg/dL (ref 65–91)

## 2019-07-10 LAB — HIV ANTIBODY (ROUTINE TESTING W REFLEX): HIV Screen 4th Generation wRfx: NONREACTIVE

## 2019-07-10 LAB — RPR: RPR Ser Ql: NONREACTIVE

## 2019-07-13 ENCOUNTER — Ambulatory Visit (HOSPITAL_COMMUNITY): Payer: Self-pay | Admitting: Obstetrics and Gynecology

## 2019-07-16 ENCOUNTER — Encounter (HOSPITAL_COMMUNITY): Payer: Self-pay | Admitting: *Deleted

## 2019-07-16 ENCOUNTER — Other Ambulatory Visit (HOSPITAL_COMMUNITY): Payer: Self-pay | Admitting: *Deleted

## 2019-07-16 ENCOUNTER — Other Ambulatory Visit: Payer: Self-pay

## 2019-07-16 ENCOUNTER — Ambulatory Visit (HOSPITAL_COMMUNITY): Payer: Self-pay | Admitting: *Deleted

## 2019-07-16 ENCOUNTER — Ambulatory Visit (HOSPITAL_COMMUNITY)
Admission: RE | Admit: 2019-07-16 | Discharge: 2019-07-16 | Disposition: A | Discharge status: Home / Self Care | Payer: Self-pay | Source: Ambulatory Visit | Attending: Obstetrics and Gynecology | Admitting: Obstetrics and Gynecology

## 2019-07-16 DIAGNOSIS — O10012 Pre-existing essential hypertension complicating pregnancy, second trimester: Secondary | ICD-10-CM

## 2019-07-16 DIAGNOSIS — O0990 Supervision of high risk pregnancy, unspecified, unspecified trimester: Secondary | ICD-10-CM

## 2019-07-16 DIAGNOSIS — Z362 Encounter for other antenatal screening follow-up: Secondary | ICD-10-CM

## 2019-07-16 DIAGNOSIS — Z3A28 28 weeks gestation of pregnancy: Secondary | ICD-10-CM

## 2019-07-16 DIAGNOSIS — O10919 Unspecified pre-existing hypertension complicating pregnancy, unspecified trimester: Secondary | ICD-10-CM | POA: Insufficient documentation

## 2019-07-23 ENCOUNTER — Other Ambulatory Visit: Payer: Self-pay

## 2019-07-23 ENCOUNTER — Ambulatory Visit (INDEPENDENT_AMBULATORY_CARE_PROVIDER_SITE_OTHER): Payer: Self-pay | Admitting: Obstetrics and Gynecology

## 2019-07-23 ENCOUNTER — Encounter: Payer: Self-pay | Admitting: Obstetrics and Gynecology

## 2019-07-23 VITALS — BP 114/78 | HR 109 | Wt 158.0 lb

## 2019-07-23 DIAGNOSIS — O0990 Supervision of high risk pregnancy, unspecified, unspecified trimester: Secondary | ICD-10-CM

## 2019-07-23 DIAGNOSIS — O10013 Pre-existing essential hypertension complicating pregnancy, third trimester: Secondary | ICD-10-CM

## 2019-07-23 DIAGNOSIS — Z3A39 39 weeks gestation of pregnancy: Secondary | ICD-10-CM

## 2019-07-23 NOTE — Patient Instructions (Signed)
Tercer trimestre de embarazo Third Trimester of Pregnancy  El tercer trimestre comprende desde la semana28 hasta la semana40 (desde el mes7 hasta el mes9). En este trimestre, el beb en gestacin (feto) crece muy rpidamente. Hacia el final del noveno mes, el beb en gestacin mide alrededor de 20pulgadas (45cm) de largo. Pesa entre 6y 10libras (2,70y 4,50kg). Siga estas indicaciones en su casa: Medicamentos  Tome los medicamentos de venta libre y los recetados solamente como se lo haya indicado el mdico. Algunos medicamentos son seguros para tomar durante el embarazo y otros no lo son.  Tome vitaminas prenatales que contengan por lo menos 600microgramos (?g) de cido flico.  Si tiene dificultad para mover el intestino (estreimiento), tome un medicamento para ablandar las heces (laxante) si su mdico se lo autoriza. Comida y bebida   Ingiera alimentos saludables de manera regular.  No coma carne cruda ni quesos sin cocinar.  Si obtiene poca cantidad de calcio de los alimentos que ingiere, consulte a su mdico sobre la posibilidad de tomar un suplemento diario de calcio.  La ingesta diaria de cuatro o cinco comidas pequeas en lugar de tres comidas abundantes.  Evite el consumo de alimentos ricos en grasas y azcares, como los alimentos fritos y los dulces.  Para evitar el estreimiento: ? Consuma alimentos ricos en fibra, como frutas y verduras frescas, cereales integrales y frijoles. ? Beba suficiente lquido para mantener el pis (orina) claro o de color amarillo plido. Actividad  Haga ejercicios solamente como se lo haya indicado el mdico. Interrumpa la actividad fsica si comienza a tener calambres.  No levante objetos pesados, use zapatos de tacones bajos y sintese derecha.  No haga ejercicio si hace demasiado calor, hay demasiada humedad o se encuentra en un lugar de mucha altura (altitud alta).  Puede continuar teniendo relaciones sexuales, a menos que el  mdico le indique lo contrario. Alivio del dolor y del malestar  Use un sostn que le brinde buen soporte si sus mamas estn sensibles.  Haga pausas frecuentes y descanse con las piernas levantadas si tiene calambres en las piernas o dolor en la zona lumbar.  Dese baos de asiento con agua tibia para aliviar el dolor o las molestias causadas por las hemorroides. Use una crema para las hemorroides si el mdico la autoriza.  Si desarrolla venas hinchadas y abultadas (vrices) en las piernas: ? Use medias de compresin o medias de descanso como se lo haya indicado el mdico. ? Levante (eleve) los pies durante 15minutos, 3 o 4veces por da. ? Limite el consumo de sal en sus alimentos. Seguridad  Colquese el cinturn de seguridad cuando conduzca.  Haga una lista de los nmeros de telfono de emergencia, que incluya los nmeros de telfono de familiares, amigos, el hospital, as como los departamentos de polica y bomberos. Preparacin para la llegada del beb Para prepararse para la llegada de su beb:  Tome clases prenatales.  Practique ir manejando al hospital.  Visite el hospital y recorra el rea de maternidad.  Hable en su trabajo acerca de tomar licencia cuando llegue el beb.  Prepare el bolso que llevar al hospital.  Prepare la habitacin del beb.  Concurra a los controles mdicos.  Compre un asiento de seguridad orientado hacia atrs para llevar al beb en el automvil. Aprenda cmo instalarlo en el auto. Instrucciones generales  No se d baos de inmersin en agua caliente, baos turcos ni saunas.  No consuma ningn producto que contenga nicotina o tabaco, como cigarrillos y cigarrillos   electrnicos. Si necesita ayuda para dejar de fumar, consulte al mdico.  No beba alcohol.  No se haga duchas vaginales ni use tampones o toallas higinicas perfumadas.  No mantenga las piernas cruzadas durante mucho tiempo.  No haga viajes de larga distancia, excepto si es  obligatorio. Hgalos solamente si su mdico la autoriza.  Visite a su dentista si no lo ha hecho durante el embarazo. Use un cepillo de cerdas suaves para cepillarse los dientes. Psese el hilo dental con suavidad.  Evite el contacto con las bandejas sanitarias de los gatos y la tierra que estos animales usan. Estos elementos contienen bacterias que pueden causar defectos congnitos al beb y la posible prdida del beb (aborto espontneo) o la muerte fetal.  Concurra a todas las visitas prenatales como se lo haya indicado el mdico. Esto es importante. Comunquese con un mdico si:  No est segura de si est en trabajo de parto o si ha roto la bolsa de las aguas.  Tiene mareos.  Tiene clicos leves o siente presin en la parte baja del vientre.  Sufre un dolor persistente en el abdomen.  Sigue teniendo malestar estomacal, vomita o tiene heces lquidas.  Advierte un lquido con olor ftido que proviene de la vagina.  Siente dolor al orinar. Solicite ayuda de inmediato si:  Tiene fiebre.  Tiene una prdida de lquido por la vagina.  Tiene sangrado o pequeas prdidas vaginales.  Siente dolor intenso o clicos en el abdomen.  Aumenta o baja de peso rpidamente.  Tiene dificultades para recuperar el aliento y siente dolor en el pecho.  Sbitamente se le hinchan mucho el rostro, las manos, los tobillos, los pies o las piernas.  No ha sentido los movimientos del beb durante una hora.  Siente un dolor de cabeza intenso que no se alivia con medicamentos.  Tiene dificultad para ver.  Tiene prdida de lquido o le sale un chorro de lquido de la vagina antes de estar en la semana 37.  Tiene espasmos abdominales (contracciones) regulares antes de estar en la semana 37. Resumen  El tercer trimestre comprende desde la semana28 hasta la semana40 (desde el mes7 hasta el mes9). Esta es la poca en que el beb en gestacin crece muy rpidamente.  Siga los consejos del mdico  con respecto a los medicamentos, la alimentacin y la actividad.  Preprese para la llegada del beb tomando las clases prenatales, preparando todo lo que necesitar el beb, arreglando la habitacin del beb y concurriendo a los controles mdicos.  Solicite ayuda de inmediato si tiene sangrado por la vagina, siente dolor en el pecho o tiene dificultad para respirar, o si no ha sentido que su beb se mueve en el transcurso de ms de una hora. Esta informacin no tiene como fin reemplazar el consejo del mdico. Asegrese de hacerle al mdico cualquier pregunta que tenga. Document Revised: 11/08/2016 Document Reviewed: 11/08/2016 Elsevier Patient Education  2020 Elsevier Inc.  

## 2019-07-23 NOTE — Progress Notes (Signed)
Subjective:  Alexis Potts is a 34 y.o. G3P1011 at [redacted]w[redacted]d being seen today for ongoing prenatal care.  She is currently monitored for the following issues for this high-risk pregnancy and has Hypertension in pregnancy and Supervision of high risk pregnancy, antepartum on their problem list.  Patient reports no complaints.  Contractions: Not present. Vag. Bleeding: None.  Movement: Present. Denies leaking of fluid.   The following portions of the patient's history were reviewed and updated as appropriate: allergies, current medications, past family history, past medical history, past social history, past surgical history and problem list. Problem list updated.  Objective:   Vitals:   07/23/19 1403  BP: 114/78  Pulse: (!) 109  Weight: 158 lb (71.7 kg)    Fetal Status: Fetal Heart Rate (bpm): 142   Movement: Present     General:  Alert, oriented and cooperative. Patient is in no acute distress.  Skin: Skin is warm and dry. No rash noted.   Cardiovascular: Normal heart rate noted  Respiratory: Normal respiratory effort, no problems with respiration noted  Abdomen: Soft, gravid, appropriate for gestational age. Pain/Pressure: Absent     Pelvic:  Cervical exam deferred        Extremities: Normal range of motion.  Edema: Trace  Mental Status: Normal mood and affect. Normal behavior. Normal judgment and thought content.   Urinalysis:      Assessment and Plan:  Pregnancy: G3P1011 at [redacted]w[redacted]d  1. Supervision of high risk pregnancy, antepartum Stable   2. Pre-existing essential hypertension during pregnancy in third trimester BP stable Continue with Norvasc Growth 07/16/19 34 %. F/U growth with weekly BPP with MFM 08/13/19  Preterm labor symptoms and general obstetric precautions including but not limited to vaginal bleeding, contractions, leaking of fluid and fetal movement were reviewed in detail with the patient. Please refer to After Visit Summary for other counseling  recommendations.  Return in about 2 weeks (around 08/06/2019) for OB visit, face to face per pt request, MD provider.   Hermina Staggers, MD

## 2019-08-13 ENCOUNTER — Other Ambulatory Visit: Payer: Self-pay

## 2019-08-13 ENCOUNTER — Ambulatory Visit (INDEPENDENT_AMBULATORY_CARE_PROVIDER_SITE_OTHER): Payer: Self-pay | Admitting: Obstetrics and Gynecology

## 2019-08-13 ENCOUNTER — Other Ambulatory Visit (HOSPITAL_COMMUNITY): Payer: Self-pay | Admitting: *Deleted

## 2019-08-13 ENCOUNTER — Encounter (HOSPITAL_COMMUNITY): Payer: Self-pay

## 2019-08-13 ENCOUNTER — Ambulatory Visit (HOSPITAL_COMMUNITY)
Admission: RE | Admit: 2019-08-13 | Discharge: 2019-08-13 | Disposition: A | Discharge status: Home / Self Care | Payer: Self-pay | Source: Ambulatory Visit | Attending: Obstetrics and Gynecology | Admitting: Obstetrics and Gynecology

## 2019-08-13 ENCOUNTER — Ambulatory Visit (HOSPITAL_COMMUNITY): Payer: Self-pay | Admitting: *Deleted

## 2019-08-13 VITALS — BP 122/86 | HR 106 | Wt 159.0 lb

## 2019-08-13 DIAGNOSIS — O10919 Unspecified pre-existing hypertension complicating pregnancy, unspecified trimester: Secondary | ICD-10-CM

## 2019-08-13 DIAGNOSIS — Z362 Encounter for other antenatal screening follow-up: Secondary | ICD-10-CM

## 2019-08-13 DIAGNOSIS — O10013 Pre-existing essential hypertension complicating pregnancy, third trimester: Secondary | ICD-10-CM | POA: Insufficient documentation

## 2019-08-13 DIAGNOSIS — Z3A32 32 weeks gestation of pregnancy: Secondary | ICD-10-CM

## 2019-08-13 DIAGNOSIS — O0990 Supervision of high risk pregnancy, unspecified, unspecified trimester: Secondary | ICD-10-CM | POA: Insufficient documentation

## 2019-08-13 DIAGNOSIS — Z789 Other specified health status: Secondary | ICD-10-CM

## 2019-08-13 NOTE — Progress Notes (Signed)
Prenatal Visit Note Date: 08/13/2019 Clinic: [facility]  Subjective:  Alexis Potts is a 34 y.o. G3P1011 at [redacted]w[redacted]d being seen today for ongoing prenatal care.  She is currently monitored for the following issues for this high-risk pregnancy and has Hypertension in pregnancy; Supervision of high risk pregnancy, antepartum; and Language barrier on their problem list.  Patient reports no complaints.   Contractions: Not present. Vag. Bleeding: None.  Movement: Present. Denies leaking of fluid.   The following portions of the patient's history were reviewed and updated as appropriate: allergies, current medications, past family history, past medical history, past social history, past surgical history and problem list. Problem list updated.  Objective:   Vitals:   08/13/19 0826  BP: 122/86  Pulse: (!) 106  Weight: 159 lb (72.1 kg)    Fetal Status: Fetal Heart Rate (bpm): 128   Movement: Present     General:  Alert, oriented and cooperative. Patient is in no acute distress.  Skin: Skin is warm and dry. No rash noted.   Cardiovascular: Normal heart rate noted  Respiratory: Normal respiratory effort, no problems with respiration noted  Abdomen: Soft, gravid, appropriate for gestational age. Pain/Pressure: Present     Pelvic:  Cervical exam deferred        Extremities: Normal range of motion.  Edema: None  Mental Status: Normal mood and affect. Normal behavior. Normal judgment and thought content.   Urinalysis:      Assessment and Plan:  Pregnancy: G3P1011 at [redacted]w[redacted]d  1. Supervision of high risk pregnancy, antepartum Routine care Ask more about BC nv  2. Pre-existing essential hypertension during pregnancy in third trimester Doing well on norvasc 5 qday and low dose asa Growth today and starting qwk bpp today  3. Language barrier Interpreter used  Preterm labor symptoms and general obstetric precautions including but not limited to vaginal  bleeding, contractions, leaking of fluid and fetal movement were reviewed in detail with the patient. Please refer to After Visit Summary for other counseling recommendations.  Return in about 1 week (around 08/20/2019) for nst/bpp with diane. 2wk in person hrob and nst/bpp with diane.   [place] Bing, MD

## 2019-08-22 ENCOUNTER — Ambulatory Visit: Payer: Self-pay

## 2019-08-22 ENCOUNTER — Ambulatory Visit (INDEPENDENT_AMBULATORY_CARE_PROVIDER_SITE_OTHER): Payer: Self-pay | Admitting: *Deleted

## 2019-08-22 ENCOUNTER — Other Ambulatory Visit: Payer: Self-pay

## 2019-08-22 VITALS — BP 117/77 | HR 78

## 2019-08-22 DIAGNOSIS — Z3A33 33 weeks gestation of pregnancy: Secondary | ICD-10-CM

## 2019-08-22 DIAGNOSIS — O10013 Pre-existing essential hypertension complicating pregnancy, third trimester: Secondary | ICD-10-CM

## 2019-08-22 NOTE — Progress Notes (Signed)
Pt informed that the ultrasound is considered a limited OB ultrasound and is not intended to be a complete ultrasound exam.  Patient also informed that the ultrasound is not being completed with the intent of assessing for fetal or placental anomalies or any pelvic abnormalities.  Explained that the purpose of today's ultrasound is to assess for  Presentation, BPP and amniotic fluid volume  Patient acknowledges the purpose of the exam and the limitations of the study.

## 2019-08-28 ENCOUNTER — Ambulatory Visit: Payer: Self-pay

## 2019-08-29 ENCOUNTER — Other Ambulatory Visit: Payer: Self-pay

## 2019-08-29 ENCOUNTER — Ambulatory Visit (INDEPENDENT_AMBULATORY_CARE_PROVIDER_SITE_OTHER): Payer: Self-pay | Admitting: Obstetrics and Gynecology

## 2019-08-29 ENCOUNTER — Ambulatory Visit: Payer: Self-pay

## 2019-08-29 ENCOUNTER — Encounter: Payer: Self-pay | Admitting: Obstetrics and Gynecology

## 2019-08-29 ENCOUNTER — Ambulatory Visit (INDEPENDENT_AMBULATORY_CARE_PROVIDER_SITE_OTHER): Payer: Self-pay | Admitting: *Deleted

## 2019-08-29 VITALS — BP 120/81 | HR 87 | Wt 161.5 lb

## 2019-08-29 DIAGNOSIS — O10013 Pre-existing essential hypertension complicating pregnancy, third trimester: Secondary | ICD-10-CM

## 2019-08-29 DIAGNOSIS — Z3A34 34 weeks gestation of pregnancy: Secondary | ICD-10-CM

## 2019-08-29 DIAGNOSIS — O0990 Supervision of high risk pregnancy, unspecified, unspecified trimester: Secondary | ICD-10-CM

## 2019-08-29 DIAGNOSIS — Z789 Other specified health status: Secondary | ICD-10-CM

## 2019-08-29 MED ORDER — AMLODIPINE BESYLATE 5 MG PO TABS: 5.0000 mg | ORAL_TABLET | Freq: Every day | ORAL | 3 refills | Status: DC

## 2019-08-29 NOTE — Patient Instructions (Signed)
Tercer trimestre de embarazo Third Trimester of Pregnancy  El tercer trimestre comprende desde la semana28 hasta la semana40 (desde el mes7 hasta el mes9). En este trimestre, el beb en gestacin (feto) crece muy rpidamente. Hacia el final del noveno mes, el beb en gestacin mide alrededor de 20pulgadas (45cm) de largo. Pesa entre 6y 10libras (2,70y 4,50kg). Siga estas indicaciones en su casa: Medicamentos  Tome los medicamentos de venta libre y los recetados solamente como se lo haya indicado el mdico. Algunos medicamentos son seguros para tomar durante el embarazo y otros no lo son.  Tome vitaminas prenatales que contengan por lo menos 600microgramos (?g) de cido flico.  Si tiene dificultad para mover el intestino (estreimiento), tome un medicamento para ablandar las heces (laxante) si su mdico se lo autoriza. Comida y bebida   Ingiera alimentos saludables de manera regular.  No coma carne cruda ni quesos sin cocinar.  Si obtiene poca cantidad de calcio de los alimentos que ingiere, consulte a su mdico sobre la posibilidad de tomar un suplemento diario de calcio.  La ingesta diaria de cuatro o cinco comidas pequeas en lugar de tres comidas abundantes.  Evite el consumo de alimentos ricos en grasas y azcares, como los alimentos fritos y los dulces.  Para evitar el estreimiento: ? Consuma alimentos ricos en fibra, como frutas y verduras frescas, cereales integrales y frijoles. ? Beba suficiente lquido para mantener el pis (orina) claro o de color amarillo plido. Actividad  Haga ejercicios solamente como se lo haya indicado el mdico. Interrumpa la actividad fsica si comienza a tener calambres.  No levante objetos pesados, use zapatos de tacones bajos y sintese derecha.  No haga ejercicio si hace demasiado calor, hay demasiada humedad o se encuentra en un lugar de mucha altura (altitud alta).  Puede continuar teniendo relaciones sexuales, a menos que el  mdico le indique lo contrario. Alivio del dolor y del malestar  Use un sostn que le brinde buen soporte si sus mamas estn sensibles.  Haga pausas frecuentes y descanse con las piernas levantadas si tiene calambres en las piernas o dolor en la zona lumbar.  Dese baos de asiento con agua tibia para aliviar el dolor o las molestias causadas por las hemorroides. Use una crema para las hemorroides si el mdico la autoriza.  Si desarrolla venas hinchadas y abultadas (vrices) en las piernas: ? Use medias de compresin o medias de descanso como se lo haya indicado el mdico. ? Levante (eleve) los pies durante 15minutos, 3 o 4veces por da. ? Limite el consumo de sal en sus alimentos. Seguridad  Colquese el cinturn de seguridad cuando conduzca.  Haga una lista de los nmeros de telfono de emergencia, que incluya los nmeros de telfono de familiares, amigos, el hospital, as como los departamentos de polica y bomberos. Preparacin para la llegada del beb Para prepararse para la llegada de su beb:  Tome clases prenatales.  Practique ir manejando al hospital.  Visite el hospital y recorra el rea de maternidad.  Hable en su trabajo acerca de tomar licencia cuando llegue el beb.  Prepare el bolso que llevar al hospital.  Prepare la habitacin del beb.  Concurra a los controles mdicos.  Compre un asiento de seguridad orientado hacia atrs para llevar al beb en el automvil. Aprenda cmo instalarlo en el auto. Instrucciones generales  No se d baos de inmersin en agua caliente, baos turcos ni saunas.  No consuma ningn producto que contenga nicotina o tabaco, como cigarrillos y cigarrillos   electrnicos. Si necesita ayuda para dejar de fumar, consulte al mdico.  No beba alcohol.  No se haga duchas vaginales ni use tampones o toallas higinicas perfumadas.  No mantenga las piernas cruzadas durante mucho tiempo.  No haga viajes de larga distancia, excepto si es  obligatorio. Hgalos solamente si su mdico la autoriza.  Visite a su dentista si no lo ha hecho durante el embarazo. Use un cepillo de cerdas suaves para cepillarse los dientes. Psese el hilo dental con suavidad.  Evite el contacto con las bandejas sanitarias de los gatos y la tierra que estos animales usan. Estos elementos contienen bacterias que pueden causar defectos congnitos al beb y la posible prdida del beb (aborto espontneo) o la muerte fetal.  Concurra a todas las visitas prenatales como se lo haya indicado el mdico. Esto es importante. Comunquese con un mdico si:  No est segura de si est en trabajo de parto o si ha roto la bolsa de las aguas.  Tiene mareos.  Tiene clicos leves o siente presin en la parte baja del vientre.  Sufre un dolor persistente en el abdomen.  Sigue teniendo malestar estomacal, vomita o tiene heces lquidas.  Advierte un lquido con olor ftido que proviene de la vagina.  Siente dolor al orinar. Solicite ayuda de inmediato si:  Tiene fiebre.  Tiene una prdida de lquido por la vagina.  Tiene sangrado o pequeas prdidas vaginales.  Siente dolor intenso o clicos en el abdomen.  Aumenta o baja de peso rpidamente.  Tiene dificultades para recuperar el aliento y siente dolor en el pecho.  Sbitamente se le hinchan mucho el rostro, las manos, los tobillos, los pies o las piernas.  No ha sentido los movimientos del beb durante una hora.  Siente un dolor de cabeza intenso que no se alivia con medicamentos.  Tiene dificultad para ver.  Tiene prdida de lquido o le sale un chorro de lquido de la vagina antes de estar en la semana 37.  Tiene espasmos abdominales (contracciones) regulares antes de estar en la semana 37. Resumen  El tercer trimestre comprende desde la semana28 hasta la semana40 (desde el mes7 hasta el mes9). Esta es la poca en que el beb en gestacin crece muy rpidamente.  Siga los consejos del mdico  con respecto a los medicamentos, la alimentacin y la actividad.  Preprese para la llegada del beb tomando las clases prenatales, preparando todo lo que necesitar el beb, arreglando la habitacin del beb y concurriendo a los controles mdicos.  Solicite ayuda de inmediato si tiene sangrado por la vagina, siente dolor en el pecho o tiene dificultad para respirar, o si no ha sentido que su beb se mueve en el transcurso de ms de una hora. Esta informacin no tiene como fin reemplazar el consejo del mdico. Asegrese de hacerle al mdico cualquier pregunta que tenga. Document Revised: 11/08/2016 Document Reviewed: 11/08/2016 Elsevier Patient Education  2020 Elsevier Inc.  

## 2019-08-29 NOTE — Progress Notes (Signed)
Interpreter [name] present for encounter. Pt informed that the ultrasound is considered a limited OB ultrasound and is not intended to be a complete ultrasound exam.  Patient also informed that the ultrasound is not being completed with the intent of assessing for fetal or placental anomalies or any pelvic abnormalities.  Explained that the purpose of today's ultrasound is to assess for presentation, BPP and amniotic fluid volume  Patient acknowledges the purpose of the exam and the limitations of the study.

## 2019-08-29 NOTE — Progress Notes (Signed)
Subjective:  Alexis Potts is a 34 y.o. G3P1011 at [redacted]w[redacted]d being seen today for ongoing prenatal care.  She is currently monitored for the following issues for this high-risk pregnancy and has Hypertension in pregnancy; Supervision of high risk pregnancy, antepartum; and Language barrier on their problem list.  Patient reports no complaints.  Contractions: Irregular. Vag. Bleeding: None.  Movement: Present. Denies leaking of fluid.   The following portions of the patient's history were reviewed and updated as appropriate: allergies, current medications, past family history, past medical history, past social history, past surgical history and problem list. Problem list updated.  Objective:   Vitals:   08/29/19 1035  BP: 120/81  Pulse: 87  Weight: 161 lb 8 oz (73.3 kg)    Fetal Status: Fetal Heart Rate (bpm): NST   Movement: Present     General:  Alert, oriented and cooperative. Patient is in no acute distress.  Skin: Skin is warm and dry. No rash noted.   Cardiovascular: Normal heart rate noted  Respiratory: Normal respiratory effort, no problems with respiration noted  Abdomen: Soft, gravid, appropriate for gestational age. Pain/Pressure: Present     Pelvic:  Cervical exam deferred        Extremities: Normal range of motion.  Edema: None  Mental Status: Normal mood and affect. Normal behavior. Normal judgment and thought content.   Urinalysis:      Assessment and Plan:  Pregnancy: G3P1011 at [redacted]w[redacted]d  1. Supervision of high risk pregnancy, antepartum Stable GBS next visit  2. Pre-existing essential hypertension during pregnancy in third trimester BPP 10/10 today Reports BP at home 120's/80's Continue with current treatment Growth 35 % on 08/13/19 Continue with weekly antenatal testing - amLODipine (NORVASC) 5 MG tablet; Take 1 tablet (5 mg total) by mouth daily.  Dispense: 90 tablet; Refill: 3  3. Language barrier Live interrupter used during today's  visit  Preterm labor symptoms and general obstetric precautions including but not limited to vaginal bleeding, contractions, leaking of fluid and fetal movement were reviewed in detail with the patient. Please refer to After Visit Summary for other counseling recommendations.  Return in about 5 days (around 09/03/2019) for OB visit, MD provider, face to face, GBS.   Hermina Staggers, MD

## 2019-09-03 ENCOUNTER — Other Ambulatory Visit: Payer: Self-pay

## 2019-09-03 ENCOUNTER — Ambulatory Visit (INDEPENDENT_AMBULATORY_CARE_PROVIDER_SITE_OTHER): Payer: Self-pay | Admitting: Obstetrics and Gynecology

## 2019-09-03 ENCOUNTER — Ambulatory Visit (INDEPENDENT_AMBULATORY_CARE_PROVIDER_SITE_OTHER): Payer: Self-pay | Admitting: *Deleted

## 2019-09-03 ENCOUNTER — Ambulatory Visit: Payer: Self-pay

## 2019-09-03 ENCOUNTER — Other Ambulatory Visit (HOSPITAL_COMMUNITY)
Admission: RE | Admit: 2019-09-03 | Discharge: 2019-09-03 | Disposition: A | Discharge status: Home / Self Care | Payer: Self-pay | Source: Ambulatory Visit | Attending: Obstetrics and Gynecology | Admitting: Obstetrics and Gynecology

## 2019-09-03 VITALS — BP 119/79 | HR 80 | Wt 163.2 lb

## 2019-09-03 DIAGNOSIS — Z789 Other specified health status: Secondary | ICD-10-CM

## 2019-09-03 DIAGNOSIS — O10013 Pre-existing essential hypertension complicating pregnancy, third trimester: Secondary | ICD-10-CM

## 2019-09-03 DIAGNOSIS — Z3A35 35 weeks gestation of pregnancy: Secondary | ICD-10-CM

## 2019-09-03 DIAGNOSIS — O0990 Supervision of high risk pregnancy, unspecified, unspecified trimester: Secondary | ICD-10-CM | POA: Insufficient documentation

## 2019-09-03 NOTE — Progress Notes (Signed)
Subjective:  Alexis Potts is a 34 y.o. G3P1011 at [redacted]w[redacted]d being seen today for ongoing prenatal care.  She is currently monitored for the following issues for this high-risk pregnancy and has Hypertension in pregnancy; Supervision of high risk pregnancy, antepartum; and Language barrier on their problem list.  Patient reports no complaints.  Contractions: Irregular. Vag. Bleeding: None.  Movement: Present. Denies leaking of fluid.   The following portions of the patient's history were reviewed and updated as appropriate: allergies, current medications, past family history, past medical history, past social history, past surgical history and problem list. Problem list updated.  Objective:   Vitals:   09/03/19 0843  BP: 119/79  Pulse: 80  Weight: 163 lb 3.2 oz (74 kg)    Fetal Status: Fetal Heart Rate (bpm): NST   Movement: Present     General:  Alert, oriented and cooperative. Patient is in no acute distress.  Skin: Skin is warm and dry. No rash noted.   Cardiovascular: Normal heart rate noted  Respiratory: Normal respiratory effort, no problems with respiration noted  Abdomen: Soft, gravid, appropriate for gestational age. Pain/Pressure: Present     Pelvic:  Cervical exam deferred        Extremities: Normal range of motion.  Edema: None  Mental Status: Normal mood and affect. Normal behavior. Normal judgment and thought content.   Urinalysis:      Assessment and Plan:  Pregnancy: G3P1011 at [redacted]w[redacted]d  1. Supervision of high risk pregnancy, antepartum Stable - GC/Chlamydia probe amp ([place])not at [facility] - Strep Gp B NAA  2. Pre-existing essential hypertension during pregnancy in third trimester BPP 10/10 today Growth U/S next week BP stable on Norvasc Will continue with Norvasc and BASA Continue with antenatal testing  3. Language barrier Video interrupter used during today's visit  Preterm labor symptoms and general obstetric precautions including but not  limited to vaginal bleeding, contractions, leaking of fluid and fetal movement were reviewed in detail with the patient. Please refer to After Visit Summary for other counseling recommendations.  Return in about 16 days (around 09/19/2019) for NST/BPP and HOB.   Hermina Staggers, MD

## 2019-09-03 NOTE — Patient Instructions (Signed)
Tercer trimestre de embarazo Third Trimester of Pregnancy  El tercer trimestre comprende desde la semana28 hasta la semana40 (desde el mes7 hasta el mes9). En este trimestre, el beb en gestacin (feto) crece muy rpidamente. Hacia el final del noveno mes, el beb en gestacin mide alrededor de 20pulgadas (45cm) de largo. Pesa entre 6y 10libras (2,70y 4,50kg). Siga estas indicaciones en su casa: Medicamentos  Tome los medicamentos de venta libre y los recetados solamente como se lo haya indicado el mdico. Algunos medicamentos son seguros para tomar durante el embarazo y otros no lo son.  Tome vitaminas prenatales que contengan por lo menos 600microgramos (?g) de cido flico.  Si tiene dificultad para mover el intestino (estreimiento), tome un medicamento para ablandar las heces (laxante) si su mdico se lo autoriza. Comida y bebida   Ingiera alimentos saludables de manera regular.  No coma carne cruda ni quesos sin cocinar.  Si obtiene poca cantidad de calcio de los alimentos que ingiere, consulte a su mdico sobre la posibilidad de tomar un suplemento diario de calcio.  La ingesta diaria de cuatro o cinco comidas pequeas en lugar de tres comidas abundantes.  Evite el consumo de alimentos ricos en grasas y azcares, como los alimentos fritos y los dulces.  Para evitar el estreimiento: ? Consuma alimentos ricos en fibra, como frutas y verduras frescas, cereales integrales y frijoles. ? Beba suficiente lquido para mantener el pis (orina) claro o de color amarillo plido. Actividad  Haga ejercicios solamente como se lo haya indicado el mdico. Interrumpa la actividad fsica si comienza a tener calambres.  No levante objetos pesados, use zapatos de tacones bajos y sintese derecha.  No haga ejercicio si hace demasiado calor, hay demasiada humedad o se encuentra en un lugar de mucha altura (altitud alta).  Puede continuar teniendo relaciones sexuales, a menos que el  mdico le indique lo contrario. Alivio del dolor y del malestar  Use un sostn que le brinde buen soporte si sus mamas estn sensibles.  Haga pausas frecuentes y descanse con las piernas levantadas si tiene calambres en las piernas o dolor en la zona lumbar.  Dese baos de asiento con agua tibia para aliviar el dolor o las molestias causadas por las hemorroides. Use una crema para las hemorroides si el mdico la autoriza.  Si desarrolla venas hinchadas y abultadas (vrices) en las piernas: ? Use medias de compresin o medias de descanso como se lo haya indicado el mdico. ? Levante (eleve) los pies durante 15minutos, 3 o 4veces por da. ? Limite el consumo de sal en sus alimentos. Seguridad  Colquese el cinturn de seguridad cuando conduzca.  Haga una lista de los nmeros de telfono de emergencia, que incluya los nmeros de telfono de familiares, amigos, el hospital, as como los departamentos de polica y bomberos. Preparacin para la llegada del beb Para prepararse para la llegada de su beb:  Tome clases prenatales.  Practique ir manejando al hospital.  Visite el hospital y recorra el rea de maternidad.  Hable en su trabajo acerca de tomar licencia cuando llegue el beb.  Prepare el bolso que llevar al hospital.  Prepare la habitacin del beb.  Concurra a los controles mdicos.  Compre un asiento de seguridad orientado hacia atrs para llevar al beb en el automvil. Aprenda cmo instalarlo en el auto. Instrucciones generales  No se d baos de inmersin en agua caliente, baos turcos ni saunas.  No consuma ningn producto que contenga nicotina o tabaco, como cigarrillos y cigarrillos   electrnicos. Si necesita ayuda para dejar de fumar, consulte al mdico.  No beba alcohol.  No se haga duchas vaginales ni use tampones o toallas higinicas perfumadas.  No mantenga las piernas cruzadas durante mucho tiempo.  No haga viajes de larga distancia, excepto si es  obligatorio. Hgalos solamente si su mdico la autoriza.  Visite a su dentista si no lo ha hecho durante el embarazo. Use un cepillo de cerdas suaves para cepillarse los dientes. Psese el hilo dental con suavidad.  Evite el contacto con las bandejas sanitarias de los gatos y la tierra que estos animales usan. Estos elementos contienen bacterias que pueden causar defectos congnitos al beb y la posible prdida del beb (aborto espontneo) o la muerte fetal.  Concurra a todas las visitas prenatales como se lo haya indicado el mdico. Esto es importante. Comunquese con un mdico si:  No est segura de si est en trabajo de parto o si ha roto la bolsa de las aguas.  Tiene mareos.  Tiene clicos leves o siente presin en la parte baja del vientre.  Sufre un dolor persistente en el abdomen.  Sigue teniendo malestar estomacal, vomita o tiene heces lquidas.  Advierte un lquido con olor ftido que proviene de la vagina.  Siente dolor al orinar. Solicite ayuda de inmediato si:  Tiene fiebre.  Tiene una prdida de lquido por la vagina.  Tiene sangrado o pequeas prdidas vaginales.  Siente dolor intenso o clicos en el abdomen.  Aumenta o baja de peso rpidamente.  Tiene dificultades para recuperar el aliento y siente dolor en el pecho.  Sbitamente se le hinchan mucho el rostro, las manos, los tobillos, los pies o las piernas.  No ha sentido los movimientos del beb durante una hora.  Siente un dolor de cabeza intenso que no se alivia con medicamentos.  Tiene dificultad para ver.  Tiene prdida de lquido o le sale un chorro de lquido de la vagina antes de estar en la semana 37.  Tiene espasmos abdominales (contracciones) regulares antes de estar en la semana 37. Resumen  El tercer trimestre comprende desde la semana28 hasta la semana40 (desde el mes7 hasta el mes9). Esta es la poca en que el beb en gestacin crece muy rpidamente.  Siga los consejos del mdico  con respecto a los medicamentos, la alimentacin y la actividad.  Preprese para la llegada del beb tomando las clases prenatales, preparando todo lo que necesitar el beb, arreglando la habitacin del beb y concurriendo a los controles mdicos.  Solicite ayuda de inmediato si tiene sangrado por la vagina, siente dolor en el pecho o tiene dificultad para respirar, o si no ha sentido que su beb se mueve en el transcurso de ms de una hora. Esta informacin no tiene como fin reemplazar el consejo del mdico. Asegrese de hacerle al mdico cualquier pregunta que tenga. Document Revised: 11/08/2016 Document Reviewed: 11/08/2016 Elsevier Patient Education  2020 Elsevier Inc.  

## 2019-09-03 NOTE — Progress Notes (Signed)
Video interpreter Nadara Eaton [phone] used for encounter.  Pt informed that the ultrasound is considered a limited OB ultrasound and is not intended to be a complete ultrasound exam.  Patient also informed that the ultrasound is not being completed with the intent of assessing for fetal or placental anomalies or any pelvic abnormalities.  Explained that the purpose of today's ultrasound is to assess for presentation, BPP and amniotic fluid volume.  Patient acknowledges the purpose of the exam and the limitations of the study.  [place] for growth/BPP @ MFM scheduled on 5/24.

## 2019-09-04 LAB — GC/CHLAMYDIA PROBE AMP (~~LOC~~) NOT AT ARMC
Chlamydia: NEGATIVE
Comment: NEGATIVE
Comment: NORMAL
Neisseria Gonorrhea: NEGATIVE

## 2019-09-05 ENCOUNTER — Encounter: Payer: Self-pay | Admitting: Obstetrics and Gynecology

## 2019-09-05 DIAGNOSIS — Z22330 Carrier of Group B streptococcus: Secondary | ICD-10-CM | POA: Insufficient documentation

## 2019-09-05 LAB — STREP GP B NAA: Strep Gp B NAA: POSITIVE — AB

## 2019-09-10 ENCOUNTER — Other Ambulatory Visit: Payer: Self-pay

## 2019-09-10 ENCOUNTER — Ambulatory Visit: Payer: Self-pay | Admitting: *Deleted

## 2019-09-10 ENCOUNTER — Ambulatory Visit (HOSPITAL_COMMUNITY): Payer: Self-pay | Attending: Obstetrics and Gynecology

## 2019-09-10 ENCOUNTER — Encounter: Payer: Self-pay | Admitting: Obstetrics and Gynecology

## 2019-09-10 ENCOUNTER — Ambulatory Visit (INDEPENDENT_AMBULATORY_CARE_PROVIDER_SITE_OTHER): Payer: Self-pay | Admitting: *Deleted

## 2019-09-10 VITALS — BP 118/79 | HR 93 | Wt 163.1 lb

## 2019-09-10 DIAGNOSIS — Z3A36 36 weeks gestation of pregnancy: Secondary | ICD-10-CM

## 2019-09-10 DIAGNOSIS — O0990 Supervision of high risk pregnancy, unspecified, unspecified trimester: Secondary | ICD-10-CM | POA: Insufficient documentation

## 2019-09-10 DIAGNOSIS — O10919 Unspecified pre-existing hypertension complicating pregnancy, unspecified trimester: Secondary | ICD-10-CM | POA: Insufficient documentation

## 2019-09-10 DIAGNOSIS — Z362 Encounter for other antenatal screening follow-up: Secondary | ICD-10-CM

## 2019-09-10 DIAGNOSIS — O10013 Pre-existing essential hypertension complicating pregnancy, third trimester: Secondary | ICD-10-CM

## 2019-09-10 NOTE — Progress Notes (Signed)
Pt has [place] for growth/BPP @ [facility] today

## 2019-09-19 ENCOUNTER — Ambulatory Visit (INDEPENDENT_AMBULATORY_CARE_PROVIDER_SITE_OTHER): Payer: Medicaid Other

## 2019-09-19 ENCOUNTER — Encounter: Payer: Self-pay | Admitting: Obstetrics & Gynecology

## 2019-09-19 ENCOUNTER — Other Ambulatory Visit: Payer: Self-pay

## 2019-09-19 ENCOUNTER — Ambulatory Visit (INDEPENDENT_AMBULATORY_CARE_PROVIDER_SITE_OTHER): Payer: Medicaid Other | Admitting: *Deleted

## 2019-09-19 ENCOUNTER — Ambulatory Visit (INDEPENDENT_AMBULATORY_CARE_PROVIDER_SITE_OTHER): Payer: Self-pay | Admitting: Obstetrics & Gynecology

## 2019-09-19 VITALS — BP 128/92 | HR 85 | Wt 161.8 lb

## 2019-09-19 DIAGNOSIS — Z3A37 37 weeks gestation of pregnancy: Secondary | ICD-10-CM

## 2019-09-19 DIAGNOSIS — O10913 Unspecified pre-existing hypertension complicating pregnancy, third trimester: Secondary | ICD-10-CM | POA: Diagnosis not present

## 2019-09-19 DIAGNOSIS — O10019 Pre-existing essential hypertension complicating pregnancy, unspecified trimester: Secondary | ICD-10-CM | POA: Diagnosis not present

## 2019-09-19 DIAGNOSIS — O10013 Pre-existing essential hypertension complicating pregnancy, third trimester: Secondary | ICD-10-CM

## 2019-09-19 NOTE — Progress Notes (Signed)
   PRENATAL VISIT NOTE  Subjective:  Alexis Potts is a 34 y.o. G3P1011 at [redacted]w[redacted]d being seen today for ongoing prenatal care.  She is currently monitored for the following issues for this high-risk pregnancy and has Hypertension in pregnancy; Supervision of high risk pregnancy, antepartum; Language barrier; and GBS carrier on their problem list.  Patient reports no complaints.  Contractions: Irregular. Vag. Bleeding: None.  Movement: Present. Denies leaking of fluid.   The following portions of the patient's history were reviewed and updated as appropriate: allergies, current medications, past family history, past medical history, past social history, past surgical history and problem list.   Objective:   Vitals:   09/19/19 0945  BP: (!) 128/92  Pulse: 85  Weight: 73.4 kg    Fetal Status: Fetal Heart Rate (bpm): NST   Movement: Present  Presentation: Vertex  General:  Alert, oriented and cooperative. Patient is in no acute distress.  Skin: Skin is warm and dry. No rash noted.   Cardiovascular: Normal heart rate noted  Respiratory: Normal respiratory effort, no problems with respiration noted  Abdomen: Soft, gravid, appropriate for gestational age.  Pain/Pressure: Present     Pelvic: Cervical exam performed in the presence of a chaperone Dilation: 1 Effacement (%): 20 Station: -3  Extremities: Normal range of motion.  Edema: None  Mental Status: Normal mood and affect. Normal behavior. Normal judgment and thought content.   Assessment and Plan:  Pregnancy: G3P1011 at [redacted]w[redacted]d 1. Pre-existing essential hypertension during pregnancy, antepartum IOL scheduled for 6/10. BPP 10/10, f/u in 1 week precautions given.   Term labor symptoms and general obstetric precautions including but not limited to vaginal bleeding, contractions, leaking of fluid and fetal movement were reviewed in detail with the patient. Please refer to After Visit Summary for other counseling recommendations.     Return in 1 week (on 09/26/2019).  Future Appointments  Date Time Provider Department Center  09/26/2019  9:15 AM [facility] NST [facility]  09/26/2019 10:15 AM Hermina Staggers, MD [facility]    Malachy Chamber, MD

## 2019-09-19 NOTE — Progress Notes (Signed)
Induction Assessment Scheduling Form: Fax to [facility]:  [phone] Route to [facility] Labor Delivery   Alexis Potts                                                                                   DOB:  Jul 22, 1985                                                            MRN:  [id number]  Phone:  Home Phone [phone]  Mobile [phone]    Provider:  [facility] (Faculty Practice)  Admission Date/Time:  09/27/19 GP:  [id number]     Gestational age on admission:  40                                                Estimated Date of Delivery: 10/04/19  Dating Criteria: [place] at 16.4 weeks  Filed Weights   09/19/19 0945  Weight: 73.4 kg    GBS: Positive/-- (05/17 1042) HIV:  Non Reactive (03/22 0910)  Reason for induction:  cHTN Scheduling Provider Signature:  Malachy Chamber, MD         Method of induction(proposed):  Cytotec   Scheduling Provider Signature:  Malachy Chamber, MD                                            Today's Date:  09/19/2019

## 2019-09-19 NOTE — Progress Notes (Signed)
[place] for growth done 5/24.

## 2019-09-19 NOTE — Patient Instructions (Signed)
Preeclampsia y eclampsia Preeclampsia and Eclampsia La preeclampsia es una afeccin grave que puede desarrollarse durante el [place]. Esta afeccin causa presin arterial alta y el aumento de protena en la orina, junto con otros sntomas, como dolores de [place] y cambios en la visin. Estos sntomas pueden aparecer a medida que la afeccin empeora. La preeclampsia puede presentarse a las 20semanas de gestacin o posteriormente. El diagnstico y el tratamiento tempranos de la preeclampsia son Donavan Burnet. Si no se la trata de inmediato, puede causarles problemas graves a usted y al beb. Una complicacin que puede provocar es la eclampsia. La eclampsia es una afeccin que causa temblores o contracciones musculares (convulsiones) y otros problemas graves en la [place]. Durante el [place], el parto del beb puede ser el mejor tratamiento para la preeclampsia o la eclampsia. En la [employer], los sntomas de la preeclampsia y la eclampsia desaparecen despus de dar a Patent examiner. En casos poco frecuentes, una mujer puede desarrollar preeclampsia despus de dar a luz (preeclampsia posparto). Esto normalmente ocurre dentro de las 48 horas despus del nacimiento del beb, pero puede aparecer hasta 6 semanas despus de dar a luz. Cules son las causas? Se desconoce la causa de la preeclampsia. Qu incrementa el riesgo? Los siguientes factores de riesgo pueden hacer que usted sea ms propensa a Warehouse manager preeclampsia:  Estar embarazada por primera vez.  Haber tenido preeclampsia durante un embarazo anterior.  Tener antecedentes familiares de preeclampsia.  Tener presin arterial alta.  Estar embarazada de ms de un beb.  Tener 35aos o ms.  Ser de Engineer, agricultural.  Tener enfermedad renal o diabetes.  Tener afecciones mdicas como lupus o enfermedades de la [place].  Tener mucho sobrepeso (obesidad). Cules son los signos o los sntomas? Los sntomas ms frecuentes son los  siguientes:  Dolor de [place] intenso.  Problemas visuales, como visin doble o borrosa.  Dolor abdominal, especialmente en la zona superior del abdomen. Otros sntomas que IT consultant a medida que la afeccin empeora incluyen:  Aumento repentino de [place].  Hinchazn repentina de las [address], el rostro, las piernas y [employer].  Nuseas y vmitos intensos.  Adormecimiento del rostro, los [place], las piernas y los pies.  Mareos.  Orinar menos que lo habitual.  Hablar arrastrando las [place].  Convulsiones. Cmo se diagnostica? No hay pruebas de deteccin de la preeclampsia. El mdico le har preguntas sobre los sntomas y verificar la presencia de signos de preeclampsia durante las visitas prenatales. Tambin pueden hacerle otros estudios que incluyen:  Control de la presin arterial.  Anlisis de orina para detectar la presencia protenas. El Facilities manager en cada visita prenatal.  Anlisis de [place].  Control de la frecuencia cardaca del beb.  Ecografa. Cmo se trata? Usted junto con su mdico determinarn el mejor enfoque de tratamiento para usted. El tratamiento puede incluir:  Realizarse exmenes prenatales con ms frecuencia para detectar signos de preeclampsia, si tiene un mayor riesgo de desarrollar dicha afeccin.  Medicamentos para bajar la presin arterial.  Engineer, maintenance hospital si la afeccin es grave. All, el tratamiento estar centrado en el control de la presin arterial y la cantidad de lquidos en el cuerpo (retencin de lquidos).  Tomar medicamentos (sulfato de magnesio) para prevenir las convulsiones. El medicamento se puede administrar como una inyeccin o por va intravenosa.  Tomar una dosis baja de aspirina durante el [place].  Dar a luz a su beb antes de tiempo. Pueden provocarle el trabajo de parto con medicamentos (  inducirse) o realizarle un parto por cesrea. Siga estas instrucciones en su casa: Comida y  bebida   Beba suficiente lquido como para mantener la orina de color amarillo plido.  Evite la cafena. Estilo de vida  No consuma ningn producto que contenga nicotina o tabaco, como cigarrillos y cigarrillos electrnicos. Si necesita ayuda para dejar de fumar, consulte al mdico.  No consuma drogas ni alcohol.  Evite las situaciones estresantes tanto como sea posible. Haga reposo y duerma bien. Instrucciones generales  Tome los medicamentos de venta libre y los recetados solamente como se lo haya indicado el mdico.  Acustese del lado izquierdo mientras hace reposo. Esto mantiene la presin fuera de los vasos sanguneos principales.  Levante (eleve) los pies mientras est sentada o acostada. Intente colocar algunas almohadas debajo de las pantorrillas.  Haga ejercicio regularmente. Consulte al mdico qu tipos de ejercicios son seguros para usted.  Concurra a todas las visitas prenatales y de control como se lo haya indicado el mdico. Esto es importante. Cmo se evita? No se conoce una forma de prevenir el desarrollo de la preeclampsia o la eclampsia. Sin embargo, para reducir el riesgo de tener complicaciones y detectar problemas de manera temprana, haga lo siguiente:  Reciba el cuidado prenatal con regularidad. Es posible que el mdico pueda diagnosticar y tratar la afeccin de forma temprana.  Mantenga un peso saludable. Pdale al mdico que la ayude a controlar su peso durante del embarazo.  Trabaje con el mdico para controlar cualquier afeccin mdica a largo plazo (crnica) que tenga, como diabetes o problemas renales.  Es posible que le hagan estudios de la presin arterial y de la funcin renal despus del parto.  El mdico puede indicarle que tome una dosis baja de aspirina durante el prximo embarazo. Comunquese con un mdico si:  Tiene sntomas que su mdico le ha informado que pueden requerir ms tratamiento o control, por ejemplo: ? Dolores de  cabeza. ? Nuseas o vmitos. ? Dolor abdominal. ? Mareos. ? Desvanecimiento. Solicite ayuda inmediatamente si:  Siente de forma intensa: ? Dolor abdominal. ? Dolores de cabeza que no mejoran. ? Mareos. ? Problemas de visin. ? Confusin. ? Nuseas o vmitos.  Tiene alguno de los siguientes sntomas: ? Una convulsin. ? Aumento de peso repentino y rpido. ? Hinchazn repentina en las manos, los tobillos o el rostro. ? Dificultad para mover cualquier parte del cuerpo. ? Adormecimiento en alguna parte del cuerpo. ? Dificultad para hablar. ? Hemorragias anormales.  Se desmaya. Resumen  La preeclampsia es una afeccin grave que puede desarrollarse durante el embarazo.  Esta afeccin causa presin arterial alta y el aumento de protena en la orina, junto con otros sntomas, como dolores de cabeza y cambios en la visin.  El diagnstico y el tratamiento tempranos de la preeclampsia son muy importantes. Si no se la trata de inmediato, puede causarles problemas graves a usted y al beb.  Solicite ayuda de inmediato si tiene sntomas a los que su mdico le indic que debera estar atenta. Esta informacin no tiene como fin reemplazar el consejo del mdico. Asegrese de hacerle al mdico cualquier pregunta que tenga. Document Revised: 12/29/2017 Document Reviewed: 11/10/2015 Elsevier Patient Education  2020 Elsevier Inc.  

## 2019-09-21 ENCOUNTER — Telehealth (HOSPITAL_COMMUNITY): Payer: Self-pay | Admitting: *Deleted

## 2019-09-21 ENCOUNTER — Encounter (HOSPITAL_COMMUNITY): Payer: Self-pay | Admitting: *Deleted

## 2019-09-21 NOTE — Telephone Encounter (Signed)
Preadmission screen  

## 2019-09-21 NOTE — Telephone Encounter (Signed)
Preadmission screen Interpreter number [phone]

## 2019-09-24 ENCOUNTER — Other Ambulatory Visit: Payer: Self-pay | Admitting: Advanced Practice Midwife

## 2019-09-24 ENCOUNTER — Other Ambulatory Visit: Payer: Self-pay | Admitting: Student

## 2019-09-25 ENCOUNTER — Other Ambulatory Visit (HOSPITAL_COMMUNITY)
Admission: RE | Admit: 2019-09-25 | Discharge: 2019-09-25 | Disposition: A | Discharge status: Home / Self Care | Payer: Self-pay | Source: Ambulatory Visit | Attending: Family Medicine | Admitting: Family Medicine

## 2019-09-25 DIAGNOSIS — Z01812 Encounter for preprocedural laboratory examination: Secondary | ICD-10-CM | POA: Insufficient documentation

## 2019-09-25 DIAGNOSIS — Z20822 Contact with and (suspected) exposure to covid-19: Secondary | ICD-10-CM | POA: Insufficient documentation

## 2019-09-25 LAB — SARS CORONAVIRUS 2 (TAT 6-24 HRS): SARS Coronavirus 2: NEGATIVE

## 2019-09-26 ENCOUNTER — Ambulatory Visit (INDEPENDENT_AMBULATORY_CARE_PROVIDER_SITE_OTHER): Payer: Medicaid Other

## 2019-09-26 ENCOUNTER — Other Ambulatory Visit: Payer: Self-pay

## 2019-09-26 ENCOUNTER — Encounter: Payer: Self-pay | Admitting: Obstetrics and Gynecology

## 2019-09-26 ENCOUNTER — Ambulatory Visit (INDEPENDENT_AMBULATORY_CARE_PROVIDER_SITE_OTHER): Payer: Self-pay | Admitting: Obstetrics and Gynecology

## 2019-09-26 ENCOUNTER — Other Ambulatory Visit: Payer: Self-pay | Admitting: Advanced Practice Midwife

## 2019-09-26 ENCOUNTER — Ambulatory Visit (INDEPENDENT_AMBULATORY_CARE_PROVIDER_SITE_OTHER): Payer: Medicaid Other | Admitting: *Deleted

## 2019-09-26 VITALS — BP 128/92 | HR 88 | Wt 162.7 lb

## 2019-09-26 DIAGNOSIS — O10913 Unspecified pre-existing hypertension complicating pregnancy, third trimester: Secondary | ICD-10-CM

## 2019-09-26 DIAGNOSIS — O10013 Pre-existing essential hypertension complicating pregnancy, third trimester: Secondary | ICD-10-CM

## 2019-09-26 DIAGNOSIS — Z22330 Carrier of Group B streptococcus: Secondary | ICD-10-CM

## 2019-09-26 DIAGNOSIS — Z789 Other specified health status: Secondary | ICD-10-CM

## 2019-09-26 DIAGNOSIS — O99820 Streptococcus B carrier state complicating pregnancy: Secondary | ICD-10-CM

## 2019-09-26 DIAGNOSIS — Z3A38 38 weeks gestation of pregnancy: Secondary | ICD-10-CM

## 2019-09-26 DIAGNOSIS — Z758 Other problems related to medical facilities and other health care: Secondary | ICD-10-CM

## 2019-09-26 DIAGNOSIS — O0990 Supervision of high risk pregnancy, unspecified, unspecified trimester: Secondary | ICD-10-CM

## 2019-09-26 NOTE — Patient Instructions (Signed)
Parto vaginal Vaginal Delivery  Parto vaginal significa que usted da a luz empujando al beb fuera del canal del parto (vagina). Un equipo de proveedores de atencin mdica la ayudar antes, durante y despus del parto vaginal. Las experiencias de los nacimientos son nicas para todas las mujeres, y cada embarazo y las experiencias de nacimiento varan segn dnde elija dar a luz. Qu ocurrir cuando llegue al centro de parto o al hospital? Una vez que se inicie el trabajo de parto y haya sido admitida en el hospital o centro de parto, el mdico podr hacer lo siguiente:  Revisar sus antecedentes de embarazo y cualquier inquietud que usted pueda tener.  Colocarle una va intravenosa en una de las venas. Esto se podr usar para administrarle lquidos y medicamentos.  Verificar su presin arterial, pulso, temperatura y frecuencia cardaca (signos vitales).  Verificar si la bolsa de agua (saco amnitico) se ha roto (ruptura).  Hablar con usted sobre su plan de nacimiento y analizar las opciones para controlar el dolor. Monitoreo Su mdico puede monitorear las contracciones (monitoreo uterino) y la frecuencia cardaca del beb (monitoreo fetal). Es posible que el monitoreo se necesite realizar:  Con frecuencia, pero no continuamente (intermitentemente).  Todo el tiempo o durante largos perodos a la vez (continuamente). El monitoreo continuo puede ser necesario si: ? Est recibiendo determinados medicamentos, tales como medicamentos para aliviar el dolor o para hacer que las contracciones sean ms fuertes. ? Tiene complicaciones durante el embarazo o el trabajo de parto. El monitoreo se puede realizar:  Al colocar un estetoscopio especial o un dispositivo manual de monitoreo en el abdomen o verificar los latidos cardacos del beb y comprobar las contracciones.  Al colocar monitores en el abdomen (monitores externos) para registrar los latidos cardacos del beb y la frecuencia y duracin de  las contracciones.  Al colocar monitores dentro del tero a travs de la vagina (monitores internos) para registrar los latidos cardacos del beb y la frecuencia, duracin y fuerza de sus contracciones. Segn el tipo de monitor, puede permanecer en el tero o en la cabeza del beb hasta el nacimiento.  Telemetra. Se trata de un tipo de monitoreo continuo que se puede realizar con monitores externos o internos. En lugar de tener que permanecer en la cama, usted puede moverse durante la telemetra. Examen fsico Su mdico puede realizar exmenes fsicos frecuentes. Esto puede incluir lo siguiente:  Verificar cmo y dnde el beb est ubicado en el tero.  Verificar el cuello uterino para determinar: ? Si se est afinando o estirando (borrando). ? Si se est abriendo (dilatando). Qu sucede durante el trabajo de parto y el parto?  El trabajo de parto y el parto normales se dividen en tres etapas: Etapa 1  Esta es la etapa ms larga del trabajo de parto.  Esta etapa puede durar horas o das.  Durante esta etapa, sentir contracciones. En general, las contracciones son leves, infrecuentes e irregulares al principio. Se hacen ms fuertes, ms frecuentes (aproximadamente cada 2 o 3 minutos) y ms regulares a medida que avanza en esta etapa.  Esta etapa finaliza cuando el cuello uterino est completamente dilatado hasta 4 pulgadas (10cm) y completamente borrado. Etapa 2  Esta etapa comienza una vez que el cuello uterino est totalmente borrado y dilatado, y dura hasta el nacimiento del beb.  Esta etapa puede durar de 20 minutos a 2 horas.  Esta es la etapa en la que va a sentir ganas de pujar al beb fuera de la   vagina.  Puede sentir un dolor urente y por estiramiento, especialmente cuando la parte ms ancha de la cabeza del beb pasa a travs de la abertura vaginal (coronacin).  Una vez que el beb nace, el cordn umbilical se pinzar y se cortar. Esto ocurre por lo general despus  de un perodo de 1 a 2 minutos despus del parto.  Colocarn al beb sobre su pecho desnudo (contacto piel con piel) en una posicin erguida y cubierto con una manta abrigada. Observe al beb para detectar seales de hambre, como el reflejo de bsqueda o succin, y acrquelo al pecho para su primera alimentacin. Etapa 3  Esta etapa comienza inmediatamente despus del nacimiento del beb y finaliza despus de la expulsin de la placenta.  Esta etapa puede durar de 5 a 30 minutos.  Despus del nacimiento del beb, puede sentir contracciones cuando el cuerpo expulsa la placenta y el tero se contrae para controlar la hemorragia. Qu puedo esperar despus del trabajo de parto y el parto?  Una vez que termine el trabajo de parto, se los controlar a usted y al beb atentamente para tener la seguridad de que ambos estn sanos y listos para ir a casa. Su equipo de atencin mdica le ensear cmo cuidarse y cuidar a su beb.  Usted y el beb permanecern en la misma habitacin (cohabitacin) durante su estada en el hospital. Esto estimular una vinculacin temprana y una lactancia exitosa.  Puede seguir recibiendo lquidos o medicamentos por va intravenosa.  Se le controlar y masajear el tero con regularidad (masaje fndico).  Tendr algo de inflamacin y dolor en el abdomen, la vagina y la zona de la piel entre la abertura vaginal y el ano (perineo).  Si se le realiz una incisin cerca de la vagina (episiotoma) o si ha tenido algn desgarro durante el parto, podran indicarle que se coloque compresas fras sobre la episiotoma o el desgarro. Esto ayuda a aliviar el dolor y la hinchazn.  Es posible que le den una botella rociadora para que use cuando vaya al bao para higienizarse. Siga los pasos a continuacin para usar la botella rociadora: ? Antes de orinar, llene la botella rociadora con agua tibia. No use agua caliente. ? Despus de orinar, mientras an est sentada en el inodoro,  use la botella rociadora para enjuagar el rea alrededor de la uretra y la abertura vaginal. Con esto podr limpiar cualquier rastro de orina y sangre. ? Llene la botella rociadora con agua limpia cada vez que vaya al bao.  Es normal tener hemorragia vaginal despus del parto. Use un apsito sanitario para el sangrado vaginal y secrecin. Resumen  Parto vaginal significa que usted dar a luz empujando al beb fuera del canal del parto (vagina).  Su mdico puede monitorear las contracciones (monitoreo uterino) y la frecuencia cardaca del beb (monitoreo fetal).  Su mdico puede realizarle un examen fsico.  El trabajo de parto y el parto normales se dividen en tres etapas.  Una vez que termina el trabajo de parto, se los controlar a usted y al beb atentamente hasta que estn listos para ir a casa. Esta informacin no tiene como fin reemplazar el consejo del mdico. Asegrese de hacerle al mdico cualquier pregunta que tenga. Document Revised: 06/15/2017 Document Reviewed: 06/15/2017 Elsevier Patient Education  2020 Elsevier Inc.  

## 2019-09-26 NOTE — Progress Notes (Signed)
Interpreter [name] present for encounter. Pt denies H/A or visual disturbances.  IOL scheduled for tomorrow however pt would like to change the IOL date to 6/13.

## 2019-09-26 NOTE — Progress Notes (Signed)
Subjective:  Alexis Potts is a 34 y.o. G3P1011 at [redacted]w[redacted]d being seen today for ongoing prenatal care.  She is currently monitored for the following issues for this high-risk pregnancy and has Hypertension in pregnancy; Supervision of high risk pregnancy, antepartum; Language barrier; and GBS carrier on their problem list.  Patient reports no complaints.  Contractions: Irregular. Vag. Bleeding: None.  Movement: Present. Denies leaking of fluid.   The following portions of the patient's history were reviewed and updated as appropriate: allergies, current medications, past family history, past medical history, past social history, past surgical history and problem list. Problem list updated.  Objective:   Vitals:   09/26/19 0929 09/26/19 1010  BP: 133/90 (!) 128/92  Pulse: 88   Weight: 162 lb 11.2 oz (73.8 kg)     Fetal Status: Fetal Heart Rate (bpm): NST   Movement: Present     General:  Alert, oriented and cooperative. Patient is in no acute distress.  Skin: Skin is warm and dry. No rash noted.   Cardiovascular: Normal heart rate noted  Respiratory: Normal respiratory effort, no problems with respiration noted  Abdomen: Soft, gravid, appropriate for gestational age. Pain/Pressure: Present     Pelvic:  Cervical exam deferred        Extremities: Normal range of motion.  Edema: None  Mental Status: Normal mood and affect. Normal behavior. Normal judgment and thought content.   Urinalysis:      Assessment and Plan:  Pregnancy: G3P1011 at [redacted]w[redacted]d  1. Supervision of high risk pregnancy, antepartum Stable IOL tomorrow  2. Pre-existing essential hypertension during pregnancy in third trimester BP stable BPP 10/10 IOL tomorrow  3. GBS carrier Tx while in labor  4. Language barrier Live interrupter used during today's visit  Term labor symptoms and general obstetric precautions including but not limited to vaginal bleeding, contractions, leaking of fluid and fetal  movement were reviewed in detail with the patient. Please refer to After Visit Summary for other counseling recommendations.  No follow-ups on file.   Hermina Staggers, MD

## 2019-09-27 ENCOUNTER — Inpatient Hospital Stay (HOSPITAL_COMMUNITY)
Admission: AD | Admit: 2019-09-27 | Discharge: 2019-09-29 | DRG: 807 | Disposition: A | Discharge status: Home / Self Care | Payer: Medicaid Other | Attending: Family Medicine | Admitting: Family Medicine

## 2019-09-27 ENCOUNTER — Inpatient Hospital Stay (HOSPITAL_COMMUNITY): Payer: Medicaid Other

## 2019-09-27 ENCOUNTER — Encounter (HOSPITAL_COMMUNITY): Payer: Self-pay | Admitting: Obstetrics & Gynecology

## 2019-09-27 ENCOUNTER — Other Ambulatory Visit: Payer: Self-pay

## 2019-09-27 DIAGNOSIS — O1092 Unspecified pre-existing hypertension complicating childbirth: Secondary | ICD-10-CM | POA: Diagnosis not present

## 2019-09-27 DIAGNOSIS — O99824 Streptococcus B carrier state complicating childbirth: Secondary | ICD-10-CM | POA: Diagnosis present

## 2019-09-27 DIAGNOSIS — O1002 Pre-existing essential hypertension complicating childbirth: Principal | ICD-10-CM | POA: Diagnosis present

## 2019-09-27 DIAGNOSIS — O169 Unspecified maternal hypertension, unspecified trimester: Secondary | ICD-10-CM | POA: Diagnosis present

## 2019-09-27 DIAGNOSIS — Z758 Other problems related to medical facilities and other health care: Secondary | ICD-10-CM | POA: Diagnosis present

## 2019-09-27 DIAGNOSIS — Z3490 Encounter for supervision of normal pregnancy, unspecified, unspecified trimester: Secondary | ICD-10-CM | POA: Diagnosis present

## 2019-09-27 DIAGNOSIS — Z789 Other specified health status: Secondary | ICD-10-CM | POA: Diagnosis present

## 2019-09-27 DIAGNOSIS — O10919 Unspecified pre-existing hypertension complicating pregnancy, unspecified trimester: Secondary | ICD-10-CM | POA: Diagnosis present

## 2019-09-27 DIAGNOSIS — O10013 Pre-existing essential hypertension complicating pregnancy, third trimester: Secondary | ICD-10-CM

## 2019-09-27 DIAGNOSIS — Z22330 Carrier of Group B streptococcus: Secondary | ICD-10-CM

## 2019-09-27 DIAGNOSIS — Z3A39 39 weeks gestation of pregnancy: Secondary | ICD-10-CM | POA: Diagnosis not present

## 2019-09-27 LAB — CBC
HCT: 37.4 % (ref 36.0–46.0)
Hemoglobin: 12.9 g/dL (ref 12.0–15.0)
MCH: 31.7 pg (ref 26.0–34.0)
MCHC: 34.5 g/dL (ref 30.0–36.0)
MCV: 91.9 fL (ref 80.0–100.0)
Platelets: 218 K/uL (ref 150–400)
RBC: 4.07 MIL/uL (ref 3.87–5.11)
RDW: 13.3 % (ref 11.5–15.5)
WBC: 5.5 K/uL (ref 4.0–10.5)
nRBC: 0 % (ref 0.0–0.2)

## 2019-09-27 LAB — COMPREHENSIVE METABOLIC PANEL
ALT: 19 U/L (ref 0–44)
AST: 24 U/L (ref 15–41)
Albumin: 3 g/dL — ABNORMAL LOW (ref 3.5–5.0)
Alkaline Phosphatase: 142 U/L — ABNORMAL HIGH (ref 38–126)
Anion gap: 10 (ref 5–15)
BUN: <5 mg/dL — ABNORMAL LOW (ref 6–20)
CO2: 18 mmol/L — ABNORMAL LOW (ref 22–32)
Calcium: 9 mg/dL (ref 8.9–10.3)
Chloride: 107 mmol/L (ref 98–111)
Creatinine, Ser: 0.42 mg/dL — ABNORMAL LOW (ref 0.44–1.00)
GFR calc Af Amer: >60 mL/min (ref >60)
GFR calc non Af Amer: >60 mL/min (ref >60)
Glucose, Bld: 94 mg/dL (ref 70–99)
Potassium: 3.5 mmol/L (ref 3.5–5.1)
Sodium: 135 mmol/L (ref 135–145)
Total Bilirubin: 0.8 mg/dL (ref 0.3–1.2)
Total Protein: 6.5 g/dL (ref 6.5–8.1)

## 2019-09-27 LAB — PROTEIN / CREATININE RATIO, URINE
Creatinine, Urine: 109.41 mg/dL
Protein Creatinine Ratio: 0.18 mg/mg{Cre} — ABNORMAL HIGH (ref 0.00–0.15)
Total Protein, Urine: 20 mg/dL

## 2019-09-27 LAB — TYPE AND SCREEN
ABO/RH(D): A POS
Antibody Screen: NEGATIVE

## 2019-09-27 LAB — RPR: RPR Ser Ql: NONREACTIVE

## 2019-09-27 MED ORDER — AMLODIPINE BESYLATE 5 MG PO TABS: 5.0000 mg | ORAL_TABLET | Freq: Every day | ORAL | Status: DC

## 2019-09-27 MED ORDER — OXYCODONE-ACETAMINOPHEN 5-325 MG PO TABS: 1.0000 | ORAL_TABLET | ORAL | Status: DC | PRN

## 2019-09-27 MED ORDER — PENICILLIN G POT IN DEXTROSE 60000 UNIT/ML IV SOLN
3.0000 10*6.[IU] | INTRAVENOUS | Status: DC
  Administered 2019-09-27 – 2019-09-28 (×3): 3 10*6.[IU] via INTRAVENOUS
  Filled 2019-09-27 (×3): qty 50

## 2019-09-27 MED ORDER — LIDOCAINE HCL (PF) 1 % IJ SOLN
30.0000 mL | INTRAMUSCULAR | Status: AC | PRN
  Administered 2019-09-28: 30 mL via SUBCUTANEOUS

## 2019-09-27 MED ORDER — TERBUTALINE SULFATE 1 MG/ML IJ SOLN: 0.2500 mg | Freq: Once | INTRAMUSCULAR | Status: DC | PRN

## 2019-09-27 MED ORDER — OXYTOCIN-SODIUM CHLORIDE 30-0.9 UT/500ML-% IV SOLN
2.5000 [IU]/h | INTRAVENOUS | Status: DC
  Administered 2019-09-28: 2.5 [IU]/h via INTRAVENOUS
  Filled 2019-09-27: qty 1000

## 2019-09-27 MED ORDER — ONDANSETRON HCL 4 MG/2ML IJ SOLN: 4.0000 mg | Freq: Four times a day (QID) | INTRAMUSCULAR | Status: DC | PRN

## 2019-09-27 MED ORDER — MISOPROSTOL 50MCG HALF TABLET
50.0000 ug | ORAL_TABLET | ORAL | Status: DC | PRN
  Administered 2019-09-27: 50 ug via ORAL
  Filled 2019-09-27: qty 1

## 2019-09-27 MED ORDER — LACTATED RINGERS IV SOLN: INTRAVENOUS | Status: DC

## 2019-09-27 MED ORDER — OXYCODONE-ACETAMINOPHEN 5-325 MG PO TABS: 2.0000 | ORAL_TABLET | ORAL | Status: DC | PRN

## 2019-09-27 MED ORDER — SOD CITRATE-CITRIC ACID 500-334 MG/5ML PO SOLN: 30.0000 mL | ORAL | Status: DC | PRN

## 2019-09-27 MED ORDER — OXYTOCIN-SODIUM CHLORIDE 30-0.9 UT/500ML-% IV SOLN
1.0000 m[IU]/min | INTRAVENOUS | Status: DC
  Administered 2019-09-27: 2 m[IU]/min via INTRAVENOUS

## 2019-09-27 MED ORDER — MISOPROSTOL 25 MCG QUARTER TABLET
25.0000 ug | ORAL_TABLET | ORAL | Status: DC | PRN
  Administered 2019-09-27: 25 ug via VAGINAL
  Filled 2019-09-27: qty 1

## 2019-09-27 MED ORDER — SODIUM CHLORIDE 0.9 % IV SOLN
5.0000 10*6.[IU] | Freq: Once | INTRAVENOUS | Status: AC
  Administered 2019-09-27: 5 10*6.[IU] via INTRAVENOUS
  Filled 2019-09-27: qty 5

## 2019-09-27 MED ORDER — LACTATED RINGERS IV SOLN: 500.0000 mL | INTRAVENOUS | Status: DC | PRN

## 2019-09-27 MED ORDER — OXYTOCIN BOLUS FROM INFUSION
500.0000 mL | Freq: Once | INTRAVENOUS | Status: AC
  Administered 2019-09-28: 500 mL via INTRAVENOUS

## 2019-09-27 MED ORDER — FLEET ENEMA 7-19 GM/118ML RE ENEM: 1.0000 | ENEMA | RECTAL | Status: DC | PRN

## 2019-09-27 MED ORDER — ACETAMINOPHEN 325 MG PO TABS: 650.0000 mg | ORAL_TABLET | ORAL | Status: DC | PRN

## 2019-09-27 NOTE — Plan of Care (Signed)
  Problem: Education: Goal: Knowledge of Childbirth will improve Outcome: Progressing Goal: Ability to make informed decisions regarding treatment and plan of care will improve Outcome: Progressing Goal: Ability to state and carry out methods to decrease the pain will improve Outcome: Progressing   

## 2019-09-27 NOTE — Progress Notes (Signed)
Patient ID: Alexis Potts, female   DOB: May 29, 1985, 34 y.o.   MRN: [id number]  Ann-Marie Kluge is a 34 y.o. G3P1011 at [redacted]w[redacted]d admitted for IOL for cHTN.  Subjective: Feeling some contractions, but not very painful yet. Wants to wait until at least 7 cm to AROM.  Wants to try different positions and peanut ball to help progress labor.   Objective: BP (!) 132/96   Pulse 70   Temp 98 F (36.7 C) (Oral)   Resp 18   Ht 5\' 2"  (1.575 m)   Wt 73.8 kg   LMP 12/30/2018   BMI 29.76 kg/m  No intake/output data recorded.  FHT:  FHR: 140 bpm, variability: moderate,  accelerations:  Present,  decelerations:  Absent UC:   regular, every 2-3 minutes  SVE:   Dilation: 5 Effacement (%): 50 Station: -2 Exam by:: Dr. [email or web address]  Pitocin @ 10 mu/min  Labs: Lab Results  Component Value Date   WBC 5.5 09/27/2019   HGB 12.9 09/27/2019   HCT 37.4 09/27/2019   MCV 91.9 09/27/2019   PLT 218 09/27/2019    Assessment / Plan: 34 y.o. G3P1011 at [redacted]w[redacted]d here for IOL due to chronic HTN.   Labor: Progressing normally.  S/p FB, Cytotec x2 (last at 1240), and on pitocin (started at 1640).  Continue pitocin.  Will place peanut ball and she will try to get some rest.   Fetal Wellbeing:  Category I Pain Control:  Labor support without medications I/D:  GBS positive, receiving penicillin, adequate treatment at present Anticipated MOD:  SVD  CHTN: No severe range BP.  Pre-E labs unremarkable.  Home Norvasc 5 mg daily starting on 6/11.    Daymion Nazaire 8/11, MD PGY-2 Resident Family Medicine 09/27/2019, 9:15 PM

## 2019-09-27 NOTE — Progress Notes (Signed)
LABOR PROGRESS NOTE  Alexis Potts is a 34 y.o. G3P1011 at [redacted]w[redacted]d  admitted for IOL in setting of chronic.  Subjective: Patient reports feeling somewhat better after foley balloon removed.   Objective: BP 131/88    Pulse 79    Temp 98 F (36.7 C) (Oral)    Resp 16    Ht 5\' 2"  (1.575 m)    Wt 73.8 kg    LMP 12/30/2018    BMI 29.76 kg/m  or  Vitals:   09/27/19 1057 09/27/19 1245 09/27/19 1400 09/27/19 1507  BP: 123/80 (!) 124/94 130/89 131/88  Pulse: 86 (!) 104 93 79  Resp:      Temp:      TempSrc:      Weight:      Height:       General: patient ambulating, in NAD  Dilation: 4.5 Effacement (%): 50 Station: -2 Presentation: Vertex Exam by:: fair FHT: baseline rate 140, moderate varibility, +acel, no decel Toco: irregular   Labs: Lab Results  Component Value Date   WBC 5.5 09/27/2019   HGB 12.9 09/27/2019   HCT 37.4 09/27/2019   MCV 91.9 09/27/2019   PLT 218 09/27/2019    Patient Active Problem List   Diagnosis Date Noted   Pregnancy with pre-existing hypertension 09/27/2019   GBS carrier 09/05/2019   Language barrier 08/13/2019   Hypertension in pregnancy 03/07/2019   Supervision of high risk pregnancy, antepartum 03/07/2019    Assessment / Plan: 34 y.o. G3P1011 at [redacted]w[redacted]d here for IOL due to chronic HTN.   Labor: progressing well, s/p FB, s/p 2 doses of cytotec, last at 1240, will start pitocin at 2 and increase by 2  Fetal Wellbeing:  Category I  Pain Control: labor support without pain medications  GBS: +, receiving PCN  Anticipated MOD:  Vaginal delivery  Chronic HTN: patient on norvasc 5mg  daily, monitoring BP every 30 minutes   [redacted]w[redacted]d, MD Family Medicine, PGY-1  09/27/2019, 4:53 PM

## 2019-09-27 NOTE — H&P (Addendum)
OBSTETRIC ADMISSION HISTORY AND PHYSICAL  Alexis Potts is a 34 y.o. female G3P1011 with IUP at [redacted]w[redacted]d by LMP presenting for IOL due to chronic HTN. She reports +FMs, No LOF, no VB, no blurry vision, headaches or peripheral edema, and RUQ pain.  She plans on breast and bottle feeding. She requests POPs for birth control. She received her prenatal care at [facility]).  Dating: By LMP --->  Estimated Date of Delivery: 10/04/19  Sono: @[redacted]w[redacted]d , CWD, normal anatomy, cephalic presentation,  posterior placenta, 2720g, 28% EFW 6'  Prenatal History/Complications: -Chronic HTN  -GBS carrier -Language barrier  Past Medical History: Past Medical History:  Diagnosis Date  . Anemia   . Hypertension   . SAB (spontaneous abortion) 06/22/2018    Past Surgical History: Past Surgical History:  Procedure Laterality Date  . NO PAST SURGERIES      Obstetrical History: OB History    Gravida  3   Para  1   Term  1   Preterm  0   AB  1   Living  1     SAB  1   TAB  0   Ectopic  0   Multiple  0   Live Births  1           Social History: Social History   Socioeconomic History  . Marital status: Single    Spouse name: Not on file  . Number of children: Not on file  . Years of education: Not on file  . Highest education level: Not on file  Occupational History  . Not on file  Tobacco Use  . Smoking status: Never Smoker  . Smokeless tobacco: Never Used  Vaping Use  . Vaping Use: Never used  Substance and Sexual Activity  . Alcohol use: Not Currently  . Drug use: Never  . Sexual activity: Yes    Birth control/protection: None  Other Topics Concern  . Not on file  Social History Narrative  . Not on file   Social Determinants of Health   Financial Resource Strain:   . Difficulty of Paying Living Expenses:   Food Insecurity: No Food Insecurity  . Worried About 08/22/2018 in the Last Year: Never true  . Ran Out of Food in the Last Year: Never true   Transportation Needs: No Transportation Needs  . Lack of Transportation (Medical): No  . Lack of Transportation (Non-Medical): No  Physical Activity:   . Days of Exercise per Week:   . Minutes of Exercise per Session:   Stress:   . Feeling of Stress :   Social Connections:   . Frequency of Communication with Friends and Family:   . Frequency of Social Gatherings with Friends and Family:   . Attends Religious Services:   . Active Member of Clubs or Organizations:   . Attends Programme researcher, broadcasting/film/video Meetings:   Banker Marital Status:     Family History: Family History  Problem Relation Age of Onset  . Diabetes Brother    Allergies: No Known Allergies  Medications Prior to Admission  Medication Sig Dispense Refill Last Dose  . amLODipine (NORVASC) 5 MG tablet Take 1 tablet (5 mg total) by mouth daily. 90 tablet 3   . aspirin EC 81 MG tablet Take 1 tablet (81 mg total) by mouth daily. Take after 12 weeks for prevention of preeclampsia later in pregnancy 300 tablet 2   . Prenatal Vit-Fe Fumarate-FA (PREPLUS) 27-1 MG TABS Take 1 tablet  by mouth daily. 30 tablet 6    Review of Systems   All systems reviewed and negative except as stated in HPI  Blood pressure 131/86, pulse 87, temperature 98 F (36.7 C), temperature source Oral, resp. rate 16, height 5\' 2"  (1.575 m), weight 73.8 kg, last menstrual period 12/30/2018, unknown if currently breastfeeding. General appearance: alert, cooperative, appears stated age and no distress Lungs: normal effort Heart: regular rate  Abdomen: soft, non-tender; bowel sounds normal Pelvic: gravid uterus Extremities: Homans sign is negative, no sign of DVT Presentation: cephalic Fetal monitoringBaseline: 140 bpm, Variability: Good {> 6 bpm), Accelerations: Reactive and Decelerations: Absent Uterine activity: irregular  Dilation: 1 Effacement (%): 40 Station: -2 Exam by:: lee, vtx  Prenatal labs: ABO, Rh: --/--/A POS (06/10 0813) Antibody: NEG  (06/10 0813) Rubella: 24.60 (12/07 1135) RPR: Non Reactive (03/22 0910)  HBsAg: Negative (12/07 1135)  HIV: Non Reactive (03/22 0910)  GBS: Positive/-- (05/17 1042)  2 hr Glucola: normal, hgb A1c 5.1  Genetic screening: NIPS low risk female, AFP negative  Anatomy US normal   Prenatal Transfer Tool  Maternal Diabetes: No Genetic Screening: Normal Maternal Ultrasounds/Referrals: Normal Fetal Ultrasounds or other Referrals:  None Maternal Substance Abuse:  No Significant Maternal Medications:  Meds include: Other:  Norvasc, bASA  Significant Maternal Lab Results: Group B Strep positive  Results for orders placed or performed during the hospital encounter of 09/27/19 (from the past 24 hour(s))  Type and screen   Collection Time: 09/27/19  8:13 AM  Result Value Ref Range   ABO/RH(D) A POS    Antibody Screen NEG    Sample Expiration      09/30/2019,2359 Performed at [facility], [place] [address], [place], [place] [address]   CBC   Collection Time: 09/27/19  8:15 AM  Result Value Ref Range   WBC 5.5 4.0 - 10.5 K/uL   RBC 4.07 3.87 - 5.11 MIL/uL   Hemoglobin 12.9 12.0 - 15.0 g/dL   HCT 37.4 36 - 46 %   MCV 91.9 80.0 - 100.0 fL   MCH 31.7 26.0 - 34.0 pg   MCHC 34.5 30.0 - 36.0 g/dL   RDW 13.3 11.5 - 15.5 %   Platelets 218 150 - 400 K/uL   nRBC 0.0 0.0 - 0.2 %    Patient Active Problem List   Diagnosis Date Noted  . Pregnancy with pre-existing hypertension 09/27/2019  . GBS carrier 09/05/2019  . Language barrier 08/13/2019  . Hypertension in pregnancy 03/07/2019  . Supervision of high risk pregnancy, antepartum 03/07/2019    Assessment/Plan:  Alexis Potts is a 34 y.o. G3P1011 at [redacted]w[redacted]d here for IOL due to chronic HTN.   #Labor:patient arrived with cervical exam 1/40/-2, placed foley balloon,received vaginal cytotec x1 at 0840, plan to start pitocin once foley removed  #Pain: Labor support without medications for now, patient counseled on options  including Nitrous ox gas, IV pain medications as epidural  #FWB: Category I ; # EFW: 6lbs  #ID:  GBS +, PCN  #MOF: breast and bottle  #MOC: POPs  #Circ:  N/a  #Chronic HTN: BP on admission within normal limits, will monitor and check CMP and Pr:Cr ratio for Pre-E evaluation, continue Norvasc 5mg  daily to start 6/11   TDap - given 07/09/19  Stark Klein, MD Family Medicine, PGY-1 09/27/2019, 10:09 AM    Midwife attestation: I have seen and examined this patient; I agree with above documentation in the resident's note.   PE: Gen:  calm comfortable, NAD Resp: normal effort and rate Abd: gravid  ROS, labs, PMH reviewed  Assessment/Plan: [redacted] weeks gestation Labor: IOL FWB: Cat I GBS: pos Admit to LD Cervical ripening then Pitocin Anticipate SVD  Donette Larry, CNM  09/27/2019, 1:36 PM

## 2019-09-28 ENCOUNTER — Encounter (HOSPITAL_COMMUNITY): Payer: Self-pay | Admitting: Obstetrics & Gynecology

## 2019-09-28 DIAGNOSIS — O1092 Unspecified pre-existing hypertension complicating childbirth: Secondary | ICD-10-CM

## 2019-09-28 DIAGNOSIS — Z3490 Encounter for supervision of normal pregnancy, unspecified, unspecified trimester: Secondary | ICD-10-CM | POA: Diagnosis present

## 2019-09-28 DIAGNOSIS — Z3A39 39 weeks gestation of pregnancy: Secondary | ICD-10-CM

## 2019-09-28 DIAGNOSIS — O1002 Pre-existing essential hypertension complicating childbirth: Secondary | ICD-10-CM

## 2019-09-28 DIAGNOSIS — O99824 Streptococcus B carrier state complicating childbirth: Secondary | ICD-10-CM

## 2019-09-28 MED ORDER — TRANEXAMIC ACID-NACL 1000-0.7 MG/100ML-% IV SOLN
INTRAVENOUS | Status: AC
  Administered 2019-09-28: 1000 mg via INTRAVENOUS
  Filled 2019-09-28: qty 100

## 2019-09-28 MED ORDER — ONDANSETRON HCL 4 MG/2ML IJ SOLN: 4.0000 mg | INTRAMUSCULAR | Status: DC | PRN

## 2019-09-28 MED ORDER — ZOLPIDEM TARTRATE 5 MG PO TABS: 5.0000 mg | ORAL_TABLET | Freq: Every evening | ORAL | Status: DC | PRN

## 2019-09-28 MED ORDER — ACETAMINOPHEN 325 MG PO TABS: 650.0000 mg | ORAL_TABLET | ORAL | Status: DC | PRN

## 2019-09-28 MED ORDER — WITCH HAZEL-GLYCERIN EX PADS: 1.0000 "application " | MEDICATED_PAD | CUTANEOUS | Status: DC | PRN

## 2019-09-28 MED ORDER — LIDOCAINE HCL (PF) 1 % IJ SOLN
INTRAMUSCULAR | Status: AC
  Filled 2019-09-28: qty 30

## 2019-09-28 MED ORDER — AMLODIPINE BESYLATE 5 MG PO TABS
5.0000 mg | ORAL_TABLET | Freq: Every day | ORAL | Status: DC
  Administered 2019-09-28 – 2019-09-29 (×2): 5 mg via ORAL
  Filled 2019-09-28 (×2): qty 1

## 2019-09-28 MED ORDER — MISOPROSTOL 200 MCG PO TABS
ORAL_TABLET | ORAL | Status: AC
  Administered 2019-09-28: 1000 ug via RECTAL
  Filled 2019-09-28: qty 5

## 2019-09-28 MED ORDER — IBUPROFEN 600 MG PO TABS
600.0000 mg | ORAL_TABLET | Freq: Four times a day (QID) | ORAL | Status: DC
  Administered 2019-09-28 – 2019-09-29 (×6): 600 mg via ORAL
  Filled 2019-09-28 (×5): qty 1

## 2019-09-28 MED ORDER — PRENATAL MULTIVITAMIN CH
1.0000 | ORAL_TABLET | Freq: Every day | ORAL | Status: DC
  Administered 2019-09-28 – 2019-09-29 (×2): 1 via ORAL
  Filled 2019-09-28 (×2): qty 1

## 2019-09-28 MED ORDER — DIPHENHYDRAMINE HCL 25 MG PO CAPS: 25.0000 mg | ORAL_CAPSULE | Freq: Four times a day (QID) | ORAL | Status: DC | PRN

## 2019-09-28 MED ORDER — BENZOCAINE-MENTHOL 20-0.5 % EX AERO: 1.0000 "application " | INHALATION_SPRAY | CUTANEOUS | Status: DC | PRN

## 2019-09-28 MED ORDER — FENTANYL CITRATE (PF) 100 MCG/2ML IJ SOLN
INTRAMUSCULAR | Status: AC
  Filled 2019-09-28: qty 2

## 2019-09-28 MED ORDER — SIMETHICONE 80 MG PO CHEW: 80.0000 mg | CHEWABLE_TABLET | ORAL | Status: DC | PRN

## 2019-09-28 MED ORDER — SENNOSIDES-DOCUSATE SODIUM 8.6-50 MG PO TABS
2.0000 | ORAL_TABLET | ORAL | Status: DC
  Administered 2019-09-28: 2 via ORAL
  Filled 2019-09-28: qty 2

## 2019-09-28 MED ORDER — TETANUS-DIPHTH-ACELL PERTUSSIS 5-2.5-18.5 LF-MCG/0.5 IM SUSP: 0.5000 mL | Freq: Once | INTRAMUSCULAR | Status: DC

## 2019-09-28 MED ORDER — TRANEXAMIC ACID-NACL 1000-0.7 MG/100ML-% IV SOLN: 1000.0000 mg | INTRAVENOUS | Status: AC

## 2019-09-28 MED ORDER — DIBUCAINE (PERIANAL) 1 % EX OINT: 1.0000 "application " | TOPICAL_OINTMENT | CUTANEOUS | Status: DC | PRN

## 2019-09-28 MED ORDER — COCONUT OIL OIL: 1.0000 "application " | TOPICAL_OIL | Status: DC | PRN

## 2019-09-28 MED ORDER — MISOPROSTOL 200 MCG PO TABS: 1000.0000 ug | ORAL_TABLET | Freq: Once | ORAL | Status: AC

## 2019-09-28 MED ORDER — FENTANYL CITRATE (PF) 100 MCG/2ML IJ SOLN
100.0000 ug | Freq: Once | INTRAMUSCULAR | Status: AC
  Administered 2019-09-28: 100 ug via INTRAVENOUS

## 2019-09-28 MED ORDER — ONDANSETRON HCL 4 MG PO TABS: 4.0000 mg | ORAL_TABLET | ORAL | Status: DC | PRN

## 2019-09-28 NOTE — Progress Notes (Addendum)
Patient ID: Alexis Potts, female   DOB: [birth year], 34 y.o.   MRN: [id number]  Alexis Potts is a 34 y.o. G3P1011 at [redacted]w[redacted]d admitted for IOL for cHTN.  Subjective: Feeling more intense contractions, 7-8/10.  Desires AROM.   Objective: BP 133/89   Pulse 88   Temp 98.5 F (36.9 C) (Oral)   Resp 18   Ht 5\' 2"  (1.575 m)   Wt 73.8 kg   LMP 12/30/2018   BMI 29.76 kg/m  Total I/O In: 484.4 [I.V.:384.4; IV Piggyback:100] Out: -   FHT:  FHR: 140 bpm, variability: moderate,  accelerations:  Present,  decelerations:  Absent UC:   regular, every 1-2 minutes  SVE:   7/70/-2  Pitocin @ 16 mu/min  Labs: Lab Results  Component Value Date   WBC 5.5 09/27/2019   HGB 12.9 09/27/2019   HCT 37.4 09/27/2019   MCV 91.9 09/27/2019   PLT 218 09/27/2019    Assessment / Plan: 34 y.o.G3P1011 at [redacted]w[redacted]d here for IOL due to chronic HTN.  Labor:Progressing normally. S/p FB, Cytotec x2 (last at 1240), and on pitocin (started at 1655). AROMed this check at 0248 with clear fluid. Continue pitocin.  Fetal Wellbeing:Category I Pain Control:Labor support without medications I/D:GBS positive, receiving penicillin, adequate treatment at present Anticipated MOD:SVD CHTN:No severe range BP. Pre-E labs unremarkable. Resume home Norvasc 5 mg daily after delivery.   Alexis Potts [redacted]w[redacted]d, MD PGY-2 Resident Family Medicine 09/28/2019, 2:51 AM

## 2019-09-28 NOTE — Progress Notes (Signed)
Patient ID: Alexis Potts, female   DOB: [birth year], 34 y.o.   MRN: [id number]  Alexis Potts is a 34 y.o. G3P1011 at [redacted]w[redacted]d admitted for IOL for cHTN.  Subjective: Feeling contractions, about 6/10.  Wants to wait until at least 7 cm to AROM.    Objective: BP 119/75   Pulse 78   Temp 98 F (36.7 C) (Oral)   Resp 18   Ht 5\' 2"  (1.575 m)   Wt 73.8 kg   LMP 12/30/2018   BMI 29.76 kg/m  Total I/O In: 484.4 [I.V.:384.4; IV Piggyback:100] Out: -   FHT:  FHR: 125 bpm, variability: moderate,  accelerations:  Present,  decelerations:  Absent UC:   regular, every 2-3 minutes  SVE:   Dilation: 6 Effacement (%): 50 Station: -3 Exam by:: Dr. [email or web address]  Pitocin @ 14 mu/min  Labs: Lab Results  Component Value Date   WBC 5.5 09/27/2019   HGB 12.9 09/27/2019   HCT 37.4 09/27/2019   MCV 91.9 09/27/2019   PLT 218 09/27/2019    Assessment / Plan: 34 y.o.G3P1011 at 106w1d here for IOL due to chronic HTN.  Labor: Progressing normally.  S/p FB, Cytotec x2 (last at 1240), and on pitocin (started at 1655).  Continue pitocin.   Fetal Wellbeing:  Category I Pain Control:  Labor support without medications I/D:  GBS positive, receiving penicillin, adequate treatment at present Anticipated MOD:  SVD  CHTN: No severe range BP.  Pre-E labs unremarkable.  Home Norvasc 5 mg daily starting on 6/11.    Alexis Potts 8/11, MD PGY-2 Resident Family Medicine 09/28/2019, 12:56 AM

## 2019-09-28 NOTE — Progress Notes (Signed)
Spanish interpreter Genella Rife here and teaching done concerning getting up safely, peri care etc. Also,  instructed mom on breast feeding. Mom encouraged to feed baby 8-12 times per day.

## 2019-09-28 NOTE — Progress Notes (Signed)
[employer] interpreter [phone] via phone used for Spanish interpretation during second stage of delivery & recovery.

## 2019-09-28 NOTE — Progress Notes (Signed)
Mom had not attempted to feed baby since 3:40am. Mom encouraged to put baby skin to skin. Baby very sleepy and did not latch at this time. Mom encouraged to try again with feeding cues.

## 2019-09-28 NOTE — Discharge Instructions (Signed)
Parto vaginal, cuidados de puerperio Postpartum Care After Vaginal Delivery Lea esta informacin sobre cmo cuidarse desde el momento en que nazca su beb y hasta 6 a 12 semanas despus del parto (perodo del posparto). El mdico tambin podr darle instrucciones ms especficas. Comunquese con su mdico si tiene problemas o preguntas. Siga estas indicaciones en su casa: Hemorragia vaginal  Es normal tener un poco de hemorragia vaginal (loquios) despus del parto. Use un apsito sanitario para el sangrado vaginal y secrecin. ? Durante la primera semana despus del parto, la cantidad y el aspecto de los loquios a menudo es similar a las del perodo menstrual. ? Durante las siguientes semanas disminuir gradualmente hasta convertirse en una secrecin seca amarronada o amarillenta. ? En la mayora de las mujeres, los loquios se detienen completamente entre 4 a 6semanas despus del parto. Los sangrados vaginales pueden variar de mujer a mujer.  Cambie los apsitos sanitarios con frecuencia. Observe si hay cambios en el flujo, como: ? Un aumento repentino en el volumen. ? Cambio en el color. ? Cogulos sanguneos grandes.  Si expulsa un cogulo de sangre por la vagina, gurdelo y llame al mdico para informrselo. No deseche los cogulos de sangre por el inodoro antes de hablar con su mdico.  No use tampones ni se haga duchas vaginales hasta que el mdico la autorice.  Si no est amamantando, volver a tener su perodo entre 6 y 8 semanas despus del parto. Si solamente alimenta al beb con leche materna (lactancia materna exclusiva), podra no volver a tener su perodo hasta que deje de amamantar. Cuidados perineales  Mantenga la zona entre la vagina y el ano (perineo) limpia y seca, como se lo haya indicado el mdico. Utilice apsitos o aerosoles analgsicos y cremas, como se lo hayan indicado.  Si le hicieron un corte en el perineo (episiotoma) o tuvo un desgarro en la vagina, controle la  zona para detectar signos de infeccin hasta que sane. Est atenta a los siguientes signos: ? Aumento del enrojecimiento, la hinchazn o el dolor. ? Presenta lquido o sangre que supura del corte o desgarro. ? Calor. ? Pus o mal olor.  Es posible que le den una botella rociadora para que use en lugar de limpiarse el rea con papel higinico despus de usar el bao. Cuando comience a sanar, podr usar la botella rociadora antes de secarse. Asegrese de secarse suavemente.  Para aliviar el dolor causado por una episiotoma, un desgarro en la vagina o venas hinchadas en el ano (hemorroides), trate de tomar un bao de asiento tibio 2 o 3 veces por da. Un bao de asiento es un bao de agua tibia que se toma mientras se est sentado. El agua solo debe llegar hasta las caderas y cubrir las nalgas. Cuidado de las mamas  En los primeros das despus del parto, las mamas pueden sentirse pesadas, llenas e incmodas (congestin mamaria). Tambin puede escaparse leche de sus senos. El mdico puede sugerirle mtodos para aliviar este malestar. La congestin mamaria debera desaparecer al cabo de unos das.  Si est amamantando: ? Use un sostn que sujete y ajuste bien sus pechos. ? Mantenga los pezones secos y limpios. Aplquese cremas y ungentos, como se lo haya indicado el mdico. ? Es posible que deba usar discos de algodn en el sostn para absorber la leche que se filtre de sus senos. ? Puede tener contracciones uterinas cada vez que amamante durante varias semanas despus del parto. Las contracciones uterinas ayudan al tero a   regresar a su tamao habitual. ? Si tiene algn problema con la lactancia materna, colabore con el mdico o un asesor en lactancia.  Si no est amamantando: ? Evite tocarse mucho las mamas. Al hacerlo, podran producir ms leche. ? Use un sostn que le proporcione el ajuste correcto y compresas fras para reducir la hinchazn. ? No extraiga (saque) leche materna. Esto har que  produzca ms leche. Intimidad y sexualidad  Pregntele al mdico cundo puede retomar la actividad sexual. Esto puede depender de lo siguiente: ? Su riesgo de sufrir infecciones. ? La rapidez con la que est sanando. ? Su comodidad y deseo de retomar la actividad sexual.  Despus del parto, puede quedar embarazada incluso si no ha tenido todava su perodo. Si lo desea, hable con el mdico acerca de los mtodos de control de la natalidad (mtodos anticonceptivos). Medicamentos  Tome los medicamentos de venta libre y los recetados solamente como se lo haya indicado el mdico.  Si le recetaron un antibitico, tmelo como se lo haya indicado el mdico. No deje de tomar el antibitico aunque comience a sentirse mejor. Actividad  Retome sus actividades normales de a poco como se lo haya indicado el mdico. Pregntele al mdico qu actividades son seguras para usted.  Descanse todo lo que pueda. Trate de descansar o tomar una siesta mientras el beb duerme. Comida y bebida   Beba suficiente lquido como para mantener la orina de color amarillo plido.  Coma alimentos ricos en fibras todos los das. Estos pueden ayudarla a prevenir o aliviar el estreimiento. Los alimentos ricos en fibras incluyen, entre otros: ? Panes y cereales integrales. ? Arroz integral. ? Frijoles. ? Frutas y verduras frescas.  No intente perder de peso rpidamente reduciendo el consumo de caloras.  Tome sus vitaminas prenatales hasta la visita de seguimiento de posparto o hasta que su mdico le indique que puede dejar de tomarlas. Estilo de vida  No consuma ningn producto que contenga nicotina o tabaco, como cigarrillos y cigarrillos electrnicos. Si necesita ayuda para dejar de fumar, consulte al mdico.  No beba alcohol, especialmente si est amamantando. Instrucciones generales  Concurra a todas las visitas de seguimiento para usted y el beb, como se lo haya indicado el mdico. La mayora de las mujeres  visita al mdico para un seguimiento de posparto dentro de las primeras 3 a 6 semanas despus del parto. Comunquese con un mdico si:  Se siente incapaz de controlar los cambios que implica tener un hijo y esos sentimientos no desaparecen.  Siente tristeza o preocupacin de forma inusual.  Las mamas se ponen rojas, le duelen o se endurecen.  Tiene fiebre.  Tiene dificultad para retener la orina o para impedir que la orina se escape.  Tiene poco inters o falta de inters en actividades que solan gustarle.  No ha amamantado nada y no ha tenido un perodo menstrual durante 12 semanas despus del parto.  Dej de amamantar al beb y no ha tenido su perodo menstrual durante 12 semanas despus de dejar de amamantar.  Tiene preguntas sobre su cuidado y el del beb.  Elimina un cogulo de sangre grande por la vagina. Solicite ayuda de inmediato si:  Siente dolor en el pecho.  Tiene dificultad para respirar.  Tiene un dolor repentino e intenso en la pierna.  Tiene dolor intenso o clicos en el la parte inferior del abdomen.  Tiene una hemorragia tan intensa de la vagina que empapa ms de un apsito en una hora. El sangrado   no debe ser ms abundante que el perodo ms intenso que haya tenido.  Dolor de cabeza intenso.  Se desmaya.  Tiene visin borrosa o manchas en la vista.  Tiene secrecin vaginal con mal olor.  Tiene pensamientos acerca de lastimarse a usted misma o a su beb. Si alguna vez siente que puede lastimarse a usted misma o a otras personas, o tiene pensamientos de poner fin a su vida, busque ayuda de inmediato. Puede dirigirse al departamento de emergencias ms cercano o llamar a:  El servicio de emergencias de su localidad (911 en [place]  Una lnea de asistencia al suicida y atencin en crisis, como la Lnea Nacional de Prevencin del Suicidio (National Suicide Prevention Lifeline), al 1-800-273-8255. Est disponible las 24 horas del da. Resumen  El  perodo de tiempo justo despus el parto y hasta 6 a 12 semanas despus del parto se denomina perodo posparto.  Retome sus actividades normales de a poco como se lo haya indicado el mdico.  Concurra a todas las visitas de seguimiento para usted y el beb, como se lo haya indicado el mdico. Esta informacin no tiene como fin reemplazar el consejo del mdico. Asegrese de hacerle al mdico cualquier pregunta que tenga. Document Revised: 07/16/2017 Document Reviewed: 03/27/2017 Elsevier Patient Education  2020 Elsevier Inc.  

## 2019-09-28 NOTE — Discharge Summary (Signed)
Postpartum Discharge Summary    Patient Name: Alexis Potts DOB: Jun 02, 1985 MRN: [id number]  Date of admission: 09/27/2019 Delivery date:09/28/2019  Delivering provider: Lenna Sciara  Date of discharge: 09/29/2019  Admitting diagnosis: Pregnancy with pre-existing hypertension [O10.919] Intrauterine pregnancy: [redacted]w[redacted]d    Secondary diagnosis:  Principal Problem:   Encounter for induction of labor Active Problems:   Hypertension in pregnancy   Language barrier   GBS carrier   Pregnancy with pre-existing hypertension  Additional problems: n/a    Discharge diagnosis: Term Pregnancy Delivered                                              Post partum procedures:n/a Augmentation: AROM, Pitocin, Cytotec and IP Foley Complications: None  Hospital course: Induction of Labor With Vaginal Delivery   34y.o. yo G3P1011 at 357w1das admitted to the hospital 09/27/2019 for induction of labor.  Indication for induction: chronic hypertension, on meds.  Patient had an uncomplicated labor course as follows: Membrane Rupture Time/Date: 2:48 AM ,09/28/2019   Delivery Method:Vaginal, Spontaneous  Episiotomy: None  Lacerations:  1st degree  Details of delivery can be found in separate delivery note.  Patient had a routine postpartum course. Patient is discharged home 09/29/19.  Newborn Data: Birth date:09/28/2019  Birth time:4:00 AM  Gender:Female  Living status:Living  Apgars:8 ,9  Weight:3050 g   Magnesium Sulfate received: No BMZ received: No Rhophylac:N/A MMR:N/A T-DaP:Given prenatally Flu: N/A Transfusion:No  Physical exam  Vitals:   09/28/19 1020 09/28/19 1427 09/28/19 2327 09/29/19 0532  BP: 121/76 121/78 107/68 105/77  Pulse: 82 70 66 71  Resp: 18 20 18 18   Temp: 98.5 F (36.9 C) 98.1 F (36.7 C) 98.1 F (36.7 C) 97.9 F (36.6 C)  TempSrc: Oral Oral Oral Oral  SpO2:   98% 98%  Weight:      Height:       General: alert, cooperative and no distress Lochia:  appropriate Uterine Fundus: firm Incision: N/A DVT Evaluation: No evidence of DVT seen on physical exam. Labs: Lab Results  Component Value Date   WBC 5.5 09/27/2019   HGB 12.9 09/27/2019   HCT 37.4 09/27/2019   MCV 91.9 09/27/2019   PLT 218 09/27/2019   CMP Latest Ref Rng & Units 09/27/2019  Glucose 70 - 99 mg/dL 94  BUN 6 - 20 mg/dL <5(L)  Creatinine 0.44 - 1.00 mg/dL 0.42(L)  Sodium 135 - 145 mmol/L 135  Potassium 3.5 - 5.1 mmol/L 3.5  Chloride 98 - 111 mmol/L 107  CO2 22 - 32 mmol/L 18(L)  Calcium 8.9 - 10.3 mg/dL 9.0  Total Protein 6.5 - 8.1 g/dL 6.5  Total Bilirubin 0.3 - 1.2 mg/dL 0.8  Alkaline Phos 38 - 126 U/L 142(H)  AST 15 - 41 U/L 24  ALT 0 - 44 U/L 19   Edinburgh Score: Edinburgh Postnatal Depression Scale Screening Tool 09/28/2019  I have been able to laugh and see the funny side of things. 0  I have looked forward with enjoyment to things. 0  I have blamed myself unnecessarily when things went wrong. 0  I have been anxious or worried for no good reason. 0  I have felt scared or panicky for no good reason. 0  Things have been getting on top of me. 1  I have been so unhappy that I  have had difficulty sleeping. 0  I have felt sad or miserable. 0  I have been so unhappy that I have been crying. 0  The thought of harming myself has occurred to me. 0  Edinburgh Postnatal Depression Scale Total 1     After visit meds:  Allergies as of 09/29/2019   No Known Allergies     Medication List    TAKE these medications   amLODipine 5 MG tablet Commonly known as: Norvasc Take 1 tablet (5 mg total) by mouth daily.   ibuprofen 600 MG tablet Commonly known as: ADVIL Take 1 tablet (600 mg total) by mouth every 6 (six) hours.   PrePLUS 27-1 MG Tabs Take 1 tablet by mouth daily.        Discharge home in stable condition Infant Feeding: Bottle and Breast Infant Disposition:home with mother Discharge instruction: per After Visit Summary and Postpartum  booklet. Activity: Advance as tolerated. Pelvic rest for 6 weeks.  Diet: routine diet Future Appointments: Future Appointments  Date Time Provider [place]  10/05/2019  9:20 AM [facility] NURSE [facility]  10/26/2019  8:35 AM Woodroe Mode, MD [facility]   Follow up Visit:  [place] [facility] Follow up.   Specialty: Obstetrics and Gynecology Why: In 1 week for a BP check and in 4 weeks for a postpartum appointment Contact information: [place] [id number] [phone]               Please schedule this patient for a In person postpartum visit in 4 weeks with the following provider: Any provider. Additional Postpartum F/U:BP check 1 week  High risk pregnancy complicated by: HTN Delivery mode:  Vaginal, Spontaneous  Anticipated Birth Control:  POPs   09/29/2019 Wende Mott, CNM

## 2019-09-29 MED ORDER — IBUPROFEN 600 MG PO TABS: 600.0000 mg | ORAL_TABLET | Freq: Four times a day (QID) | ORAL | 0 refills | Status: DC

## 2019-09-29 NOTE — Progress Notes (Signed)
Discharge education reviewed using Raquel, in house interpreter. Questions answered and patient comfortable for discharge. Earl Gala, Linda [facility] [place]

## 2019-10-05 ENCOUNTER — Ambulatory Visit: Payer: Self-pay

## 2019-10-09 ENCOUNTER — Other Ambulatory Visit: Payer: Self-pay

## 2019-10-09 ENCOUNTER — Ambulatory Visit (INDEPENDENT_AMBULATORY_CARE_PROVIDER_SITE_OTHER): Payer: Self-pay

## 2019-10-09 VITALS — BP 125/91 | HR 69 | Ht 62.0 in | Wt 147.6 lb

## 2019-10-09 DIAGNOSIS — I10 Essential (primary) hypertension: Secondary | ICD-10-CM

## 2019-10-09 MED ORDER — AMLODIPINE BESYLATE 10 MG PO TABS: 10.0000 mg | ORAL_TABLET | Freq: Every day | ORAL | 1 refills | Status: DC

## 2019-10-09 NOTE — Progress Notes (Signed)
Pt here today for BP check s/p vaginal delivery with pre-existing HTN.  With [employer] pt denies any headache or blurred vision.  Pt reports that her last dose of amlodipine 5 mg tablet was this am.  BP LA 123/94.  Rpt BP RA 125/91.  Notified Vonzella Nipple, PA who recommends that the amlodipine dose increase from 5 mg to 10 mg and then rpt BP check on Friday.  Pt informed of provider recommendations and that we will reevaluate her BP with new dose on 10/12/19.  Pt verbalized understanding.  Addison Naegeli, RN  10/09/19

## 2019-10-10 NOTE — Progress Notes (Signed)
Patient was assessed and managed by nursing staff during this encounter. I have reviewed the chart and agree with the documentation and plan. I have sent Rx as noted below. Patient to return on Friday for BP check   Vonzella Nipple, PA-C 10/10/2019 11:40 AM

## 2019-10-12 ENCOUNTER — Ambulatory Visit (INDEPENDENT_AMBULATORY_CARE_PROVIDER_SITE_OTHER): Payer: Self-pay | Admitting: *Deleted

## 2019-10-12 ENCOUNTER — Other Ambulatory Visit: Payer: Self-pay

## 2019-10-12 VITALS — BP 119/81 | HR 71 | Wt 143.5 lb

## 2019-10-12 DIAGNOSIS — Z0130 Encounter for examination of blood pressure without abnormal findings: Secondary | ICD-10-CM

## 2019-10-12 NOTE — Progress Notes (Signed)
Patient was assessed and managed by nursing staff during this encounter. I have reviewed the chart and agree with the documentation and plan. I have also made any necessary editorial changes.  Jerrett Baldinger, MD 10/12/2019 12:13 PM 

## 2019-10-12 NOTE — Progress Notes (Signed)
Here for bp check after increase of Amlodipine. After vaginal delivery with pre- existing HTN. She is taking 2 of her 5mg  amlodipine.  She denies headaches or edema. Reviewed todays's blood pressures with Dr. 03[birth year]1989 and explained to patient will not make any changes today. Continue norvasc 10 mg daily. Return for already scheduled postpartum appointment on 10/26/19. If she has sudden edema , severe headaches, go to hospital. She voices understanding. Nadyne Gariepy,RN

## 2019-10-26 ENCOUNTER — Encounter: Payer: Self-pay | Admitting: Obstetrics & Gynecology

## 2019-10-26 ENCOUNTER — Other Ambulatory Visit: Payer: Self-pay

## 2019-10-26 ENCOUNTER — Ambulatory Visit (INDEPENDENT_AMBULATORY_CARE_PROVIDER_SITE_OTHER): Payer: Self-pay | Admitting: Obstetrics & Gynecology

## 2019-10-26 VITALS — BP 111/83 | HR 71 | Ht 62.0 in | Wt 144.4 lb

## 2019-10-26 DIAGNOSIS — I10 Essential (primary) hypertension: Secondary | ICD-10-CM

## 2019-10-26 DIAGNOSIS — O1093 Unspecified pre-existing hypertension complicating the puerperium: Secondary | ICD-10-CM

## 2019-10-26 DIAGNOSIS — O10013 Pre-existing essential hypertension complicating pregnancy, third trimester: Secondary | ICD-10-CM

## 2019-10-26 MED ORDER — NORETHINDRONE 0.35 MG PO TABS: 1.0000 | ORAL_TABLET | Freq: Every day | ORAL | 11 refills | Status: DC

## 2019-10-26 MED ORDER — AMLODIPINE BESYLATE 10 MG PO TABS: 10.0000 mg | ORAL_TABLET | Freq: Every day | ORAL | 2 refills | Status: DC

## 2019-10-26 NOTE — Patient Instructions (Signed)
Eleccin del mtodo anticonceptivo Contraception Choices La anticoncepcin, o los mtodos anticonceptivos, son medidas que se toman o maneras a fin de intentar no quedar embarazada. Mtodo anticonceptivo hormonal Este tipo de mtodo anticonceptivo contiene hormonas. A continuacin se mencionan algunos tipos de mtodos anticonceptivos hormonales:  Un tubo que se coloca debajo de la piel del brazo (implante). El tubo puede permanecer en el lugar durante 3aos.  Inyecciones que debe recibir cada 3meses.  Pldoras que debe tomar todos los das (pldoras anticonceptivas).  Un parche que se debe cambiar 1vez por semana durante 3semanas (parche anticonceptivo). Despus de ese tiempo, el parche se debe retirar durante 1semana.  Un anillo que se coloca en la vagina. El anillo se deja colocado durante 3 semanas. Luego, se debe retirar de la vagina durante 1semana. Luego, se coloca un nuevo anillo en la vagina.  Pldoras que se deben tomar despus de tener sexo sin proteccin (pldoras anticonceptivas de emergencia). Mtodos anticonceptivos de barrera A continuacin se mencionan algunos tipos de mtodos anticonceptivos de barrera:  Una cubierta delgada que se coloca sobre el pene antes de tener sexo (preservativo masculino). La cubierta se desecha despus de tener sexo.  Una cubierta blanda y suelta que se coloca en la vagina antes de tener sexo (preservativo femenino). La cubierta se desecha despus de tener sexo.  Un dispositivo de goma que se aplica sobre el cuello uterino (diafragma). Este dispositivo debe fabricarse para usted. Se coloca en la vagina antes de tener sexo. Se debe dejar colocado durante 6 a 8horas despus de tener sexo. Se debe retirar en un plazo de 24horas.  Un capuchn pequeo y suave que se fija sobre el cuello uterino (capuchn cervical). Este capuchn debe fabricarse para usted. Se debe dejar colocado durante 6 a 8horas despus de tener sexo. Se debe retirar en un  plazo de 48horas.  Una esponja que se coloca en la vagina antes de tener sexo. Se debe dejar colocada durante al menos 6horas despus de tener sexo. Se debe retirar en un plazo de 30horas. Luego, debe desecharse.  Una sustancia qumica que destruye o impide que los espermatozoides ingresen al tero (espermicida). Se puede presentar en forma de pldora, crema, gel o espuma y se debe colocar en la vagina. La sustancia qumica se debe usar al menos de 10 a 15minutos antes de tener sexo. Dispositivo anticonceptivo intrauterino (DIU) El DIU es un pequeo dispositivo plstico en forma de T. Se coloca en el interior del tero. Existen dos tipos diferentes:  DIU hormonal. Este tipo puede permanecer colocado durante 3 a 5aos.  DIU de cobre. Este tipo puede permanecer colocado durante 10aos. Mtodos anticonceptivos permanentes A continuacin se mencionan algunos tipos de mtodos anticonceptivos permanentes:  Ciruga para obstruir las trompas de [place].  Colocacin de un dispositivo en cada una de las trompas de Falopio.  Ciruga para atar los conductos que transportan el esperma (vasectoma). Mtodos anticonceptivos por planificacin natural A continuacin se mencionan algunos mtodos anticonceptivos por planificacin natural:  No tener sexo en los das frtiles de la mujer.  Usar un calendario a fin de: ? Llevar un registro de la duracin de cada perodo. ? Determinar en qu das se podra producir el embarazo. ? Planificar no tener sexo en los das en que se podra producir el embarazo.  Reconocer los sntomas de la ovulacin y no tener sexo durante la ovulacin. Una manera en que la mujer puede detectar la ovulacin es controlarse la temperatura.  Esperar para tener sexo hasta despus de la   ovulacin. Resumen  La anticoncepcin, o los mtodos anticonceptivos, son medidas que se toman o maneras a fin de intentar no quedar embarazada.  Los mtodos anticonceptivos hormonales  incluyen implantes, inyecciones, pldoras, parches, anillos vaginales y pldoras anticonceptivas de emergencia.  Los mtodos anticonceptivos de barrera pueden incluir preservativos masculinos, preservativos femeninos, diafragmas, capuchones cervicales, esponjas y espermicidas.  Existen dos tipos diferentes de DIU (dispositivo intrauterino) anticonceptivo. Un DIU puede colocarse en el tero de una mujer para evitar el embarazo durante 3 a 5 aos.  La esterilizacin permanente puede realizarse mediante un procedimiento para hombres, mujeres o ambos.  Los mtodos anticonceptivos por planificacin familiar natural incluyen no tener sexo en los das frtiles de la mujer. Esta informacin no tiene como fin reemplazar el consejo del mdico. Asegrese de hacerle al mdico cualquier pregunta que tenga. Document Revised: 12/07/2017 Document Reviewed: 11/24/2016 Elsevier Patient Education  2020 Elsevier Inc.  

## 2019-10-26 NOTE — Progress Notes (Signed)
    Post Partum Visit Note  Alexis Potts is a 34 y.o. [id number] female who presents for a postpartum visit. She is 4 weeks postpartum following a normal spontaneous vaginal delivery.  I have fully reviewed the prenatal and intrapartum course. The delivery was at 39/1 gestational weeks.  Anesthesia: none. Postpartum course has been good. Baby is doing well. Baby is feeding by both breast and bottle - Carnation Good Start. Bleeding staining only. Bowel function is normal. Bladder function is normal. Patient is not sexually active. Contraception method is none. Postpartum depression screening: negative.  The following portions of the patient's history were reviewed and updated as appropriate: allergies, current medications, past family history, past medical history, past social history, past surgical history and problem list.  Review of Systems Pertinent items are noted in HPI.    Objective:  Last menstrual period 12/30/2018, unknown if currently breastfeeding. Blood pressure 111/83, pulse 71, height 5\' 2"  (1.575 m), weight 144 lb 6.4 oz (65.5 kg), last menstrual period 12/30/2018, unknown if currently breastfeeding.  General:  alert and cooperative            Abdomen: normal findings: not distended   Vulva:  not evaluated  Vagina: not evaluated                    Assessment:    normal postpartum exam. Pap smear not done at today's visit.   Plan:   Essential components of care per ACOG recommendations:  1.  Mood and well being: Patient with negative depression screening today. Reviewed local resources for support.  - Patient does not use tobacco.- hx of drug use? No  2. Infant care and feeding:  -Patient currently breastmilk feeding? Yes If breastmilk feeding discussed return to work and pumping. If needed, patient was provided letter for work to allow for every 2-3 hr pumping breaks, and to be granted a private location to express breastmilk and refrigerated area to  store breastmilk. Reviewed importance of draining breast regularly to support lactation. -Social determinants of health (SDOH) reviewed in EPIC. No concerns  3. Sexuality, contraception and birth spacing - Patient does not want a pregnancy in the next year.  Desired family size is 2 children.  - Reviewed forms of contraception in tiered fashion. Patient desired oral progesterone-only contraceptive today.     4. Sleep and fatigue -Encouraged family/partner/community support of 4 hrs of uninterrupted sleep to help with mood and fatigue  5. Physical Recovery  - Discussed patients delivery - Patient had a first degree laceration, perineal healing reviewed. Patient expressed understanding - Patient has urinary incontinence? No  - Patient is safe to resume physical and sexual activity  6.  Health Maintenance - Last pap smear done 12/20 and was normal with negative HPV.   7. CHTN Chronic Disease, continue Norvasc for now, referral to PCP made - PCP follow up .1/21, MD  Center for [facility], [facility]

## 2019-11-06 ENCOUNTER — Telehealth (INDEPENDENT_AMBULATORY_CARE_PROVIDER_SITE_OTHER): Payer: Self-pay | Admitting: Family Medicine

## 2019-11-06 DIAGNOSIS — N939 Abnormal uterine and vaginal bleeding, unspecified: Secondary | ICD-10-CM

## 2019-11-06 NOTE — Telephone Encounter (Signed)
Spoke to patient with Spanish interpreter. Patient called into the office asking if it was normal to only have one postpartum visit after having her baby. Patient instructed that this correct unless she is having any type of issues/concerns. Patient stated that she has been having bleeding since the delivery of her baby. Patient instructed that a message will be sent to the nurses and they will contact her as soon as they can. Patient verbalized understanding. Message sent to clinical pool.

## 2019-11-09 MED ORDER — PREPLUS 27-1 MG PO TABS: 1.0000 | ORAL_TABLET | Freq: Every day | ORAL | 6 refills | Status: DC

## 2019-11-09 NOTE — Telephone Encounter (Signed)
Called patient with [employer] [phone] stating I was returning her phone call. Patient states she is very upset that no one scheduled her another postpartum visit. Patient doesn't understand why we only see people back one time after delivery. Patient states what if something is wrong and going on. Patient is upset that she didn't have a physical exam at her postpartum visit. She is concerned because she is still having bleeding (spotting). Patient denies severe abdominal pain, fever/chills, passage of large clots, or heavy bleeding. She started taking OCPs on Sunday. Patient states she is breast/formula feeding because she doesn't feel like she is making enough milk. Per chart review, patient delivered 6/11. Apologized to patient and discussed we normally only see patients back for another appt after the postpartum visit if there is a specific issue that is going on we are worried about. Discussed at length with patient abnormal findings/symptoms that would be concerning to [place]. Reassured patient that it sounds like she is healing normally/well otherwise we would've scheduled her an appt to come back. Told patient I am happy to schedule a follow up appt for her for a physical exam so that she is reassured. Offered an appt to see lactation as well and discussed what happens at a lactation appt. Patient verbalized understanding and states see that is another thing, why wasn't that appt set up for me when I said I wasn't making a lot of milk. Apologized to patient that wasn't offered at that time. Offered to schedule an appt in the office for her and an appt for her and baby. Patient declined and states we will just see how things go & I will possibly call back. Patient states she may seek care somewhere else that wants to see her and do an exam. Apologized to patient and stated we are more than happy to see her if she would like. Patient verbalized understanding and requested PNV Rx & declined appt at this  time. Patient had no other questions.Rx sent to pharmacy

## 2019-11-09 NOTE — Addendum Note (Signed)
Addended by: Kathee Delton on: 11/09/2019 10:33 AM   Modules accepted: Orders

## 2020-04-03 ENCOUNTER — Telehealth: Payer: Self-pay | Admitting: General Practice

## 2020-04-03 NOTE — Telephone Encounter (Signed)
Patient called and left message on nurse voicemail line stating she needs a medication refill. Patient left no other message.

## 2020-04-04 NOTE — Telephone Encounter (Addendum)
Called pt w/[employer] interpreter [place]. Pt did not answer and a VM message was left stating that I am returning her call. I requested that she call back with a new message and state the name of the medication she is requesting. I also stated that the office closes @ 12 today and refills will not be able to be sent until Monday 12/20.  12/20  1024  Pt left new VM message stating that she needs refill of prenatal vitamins and BP medication.

## 2020-04-09 NOTE — Telephone Encounter (Signed)
Called patient with Alexis Potts for spanish interpreter, no answer- left message stating we are trying to reach you to return your phone call, please call us back if you still need assistance.

## 2020-04-10 ENCOUNTER — Telehealth: Payer: Self-pay | Admitting: Obstetrics & Gynecology

## 2020-04-10 ENCOUNTER — Telehealth: Payer: Self-pay | Admitting: Family Medicine

## 2020-04-10 DIAGNOSIS — I10 Essential (primary) hypertension: Secondary | ICD-10-CM

## 2020-04-10 MED ORDER — AMLODIPINE BESYLATE 10 MG PO TABS: 10.0000 mg | ORAL_TABLET | Freq: Every day | ORAL | 1 refills | Status: DC

## 2020-04-10 NOTE — Telephone Encounter (Signed)
Spanish -Pt states that she needs refill on BP meds and that it would be sent from this facility. I was unable to get into pt chart per system would not allow me to view past notes.  Pt would like a call back ASAP concerning this.  I advised pt that she would need to call her PCP for BP meds but was told to call us. thanks

## 2020-04-10 NOTE — Telephone Encounter (Signed)
Spoke with Dr. Crissie Reese who approved refill of Norvasc. Patient advised. Patient reports she has been taking daily although prescription should have run out in September.   Called Primary Care at [facility] and did not get an answer. Referral in chart indicates not to schedule. Patient reports she is not sure if she has called the Primary Care office or not, no appointments noted in the chart. Will need to follow up next week to get patient scheduled for Primary Care.

## 2020-04-15 NOTE — Telephone Encounter (Signed)
Called Primary Care on [address] to set up a new patient appointment.   Appointment made for January 17 at 8:50.   Called patient with assistance of [employer] Spanish [place] Stare # [phone]. Patient did not answer. LM for her to call the office for appointment information.

## 2020-04-15 NOTE — Telephone Encounter (Signed)
Called Primary Care at [facility] to schedule patient for Primary Care appt. Number busy and not able to leave a message.

## 2020-04-16 NOTE — Telephone Encounter (Signed)
Called patient with assistance of [employer], Research officer, trade union. She did not answer. LM for her to call the office for appointment information. Will send letter.

## 2020-04-17 ENCOUNTER — Telehealth: Payer: Self-pay | Admitting: Lactation Services

## 2020-04-17 NOTE — Telephone Encounter (Signed)
Patient called in this morning to return our call.   Called patient to inform her scheduled appointment at Primary Care at [facility] on January 17 at 08:50 am. Patient was informed that letter was sent with appointment information.

## 2020-04-19 NOTE — L&D Delivery Note (Addendum)
OB/GYN Faculty Practice Delivery Note  Alexis Potts is a 35 y.o. [id number] s/p SVD at [redacted]w[redacted]d. She was admitted for IOL for cHTN.   ROM: 3h 14m with clear fluid GBS Status: Negative Maximum Maternal Temperature: 98.0  Delivery Date/Time: 04/09/2021 at 0120 Delivery: Called to room and patient was complete and pushing. Head delivered, ROA. Tight nuchal cord present around neck and shoulder, easily reduced after delivery of shoulder and body. Infant with spontaneous cry, placed on mother's abdomen, dried and stimulated. Cord clamped x 2 after 1-minute delay, and cut by FOB. Cord blood drawn. Placenta delivered spontaneously with gentle cord traction. Fundus initially boggy but became firm with administration of TXA, methergine x1, massage and Pitocin. Labia, perineum, vagina, and cervix inspected inspected with 1st degree perineal laceration repaired.  Placenta: 3VC, Intact. Sent to L&D Complications: None Lacerations: 1st degree perineal laceration repaired with 3-0 vicryl EBL: 500 cc Analgesia: 1% lidocaine  Infant: Viable   APGARs 9 & 9   pending weight  Darral Dash, D.O. [facility], PGY-1 04/09/2021, 2:02 AM     GME ATTESTATION:  I saw and evaluated the patient. I agree with the findings and the plan of care as documented in the residents note and have made all necessary edits. I was gloved and present for entire delivery.  Warner Mccreedy, MD, MPH OB Fellow, Faculty Practice [facility], Center for [facility] 04/09/2021 2:16 AM

## 2020-04-22 NOTE — Telephone Encounter (Signed)
error 

## 2020-05-05 ENCOUNTER — Encounter: Payer: Self-pay | Admitting: Family

## 2020-05-05 ENCOUNTER — Telehealth (INDEPENDENT_AMBULATORY_CARE_PROVIDER_SITE_OTHER): Payer: Self-pay | Admitting: Family

## 2020-05-05 ENCOUNTER — Telehealth: Payer: Self-pay

## 2020-05-05 DIAGNOSIS — Z7689 Persons encountering health services in other specified circumstances: Secondary | ICD-10-CM

## 2020-05-05 DIAGNOSIS — Z789 Other specified health status: Secondary | ICD-10-CM

## 2020-05-05 DIAGNOSIS — I10 Essential (primary) hypertension: Secondary | ICD-10-CM

## 2020-05-05 DIAGNOSIS — Z599 Problem related to housing and economic circumstances, unspecified: Secondary | ICD-10-CM

## 2020-05-05 MED ORDER — AMLODIPINE BESYLATE 10 MG PO TABS: 10.0000 mg | ORAL_TABLET | Freq: Every day | ORAL | 0 refills | Status: DC

## 2020-05-05 MED ORDER — PREPLUS 27-1 MG PO TABS: 1.0000 | ORAL_TABLET | Freq: Every day | ORAL | 6 refills | Status: DC

## 2020-05-05 NOTE — Telephone Encounter (Signed)
Contacted patient by phone with interpreter # [phone] on the line to schedule 4 to 6 week appt and to provide information for [facility]. Patient acknowledged appt date and time and information for following up with the provider for any questions before schedule appt with the provider.

## 2020-05-05 NOTE — Patient Instructions (Addendum)
Return in 4 to 6 weeks or sooner if needed for annual physical examination, labs, and health maintenance. Arrive fasting meaning having had no food and/or nothing to drink for at least 8 hours prior to appointment.   Continue Amlodipine for high blood pressure.   Continue vitamin pill.  Thank you for choosing Primary Care at [facility] for your medical home!    Alexis Potts was seen by Rema Fendt, NP today.   Alexis Potts's primary care provider is Atalaya Zappia Jodi Geralds, NP.   For the best care possible,  you should try to see Ricky Stabs, NP whenever you come to clinic.   We look forward to seeing you again soon!  If you have any questions about your visit today,  please call us at [phone]  Or feel free to reach your provider via MyChart.    Hipertensin en los adultos Hypertension, Adult El trmino hipertensin es otra forma de denominar a la presin arterial elevada. La presin arterial elevada fuerza al corazn a trabajar ms para bombear la sangre. Esto puede causar problemas con el paso del [place]. Una lectura de presin arterial est compuesta por 2 nmeros. Hay un nmero superior (sistlico) sobre un nmero inferior (diastlico). Lo ideal es tener la presin arterial por debajo de 120/80. Las elecciones saludables pueden ayudar a Personal assistant presin arterial, o tal vez necesite medicamentos para bajarla. Cules son las causas? Se desconoce la causa de esta afeccin. Algunas afecciones pueden estar relacionadas con la presin arterial alta. Qu incrementa el riesgo?  Fumar.  Tener diabetes mellitus tipo 2, colesterol alto, o ambos.  No hacer la cantidad suficiente de actividad fsica o ejercicio.  Tener sobrepeso.  Consumir mucha grasa, azcar, caloras o sal (sodio) en su dieta.  Beber alcohol en exceso.  Tener una enfermedad renal a largo plazo (crnica).  Tener antecedentes familiares de presin arterial alta.  Edad. Los riesgos  aumentan con la edad.  Raza. El riesgo es mayor para las Statistician.  Sexo. Antes de los 45aos, los hombres corren ms [employer]. Despus de los 65aos, las mujeres corren ms [employer].  Tener apnea obstructiva del sueo.  Estrs. Cules son los signos o los sntomas?  Es posible que la presin arterial alta puede no cause sntomas. La presin arterial muy alta (crisis hipertensiva) puede provocar: ? Dolor de [place]. ? Sensaciones de preocupacin o nerviosismo (ansiedad). ? Falta de aire. ? Hemorragia nasal. ? Sensacin de Journalist, newspaper (nuseas). ? Vmitos. ? Cambios en la forma de ver. ? Dolor muy intenso en el pecho. ? Convulsiones. Cmo se trata?  Esta afeccin se trata haciendo cambios saludables en el estilo de vida, por ejemplo: ? Consumir alimentos saludables. ? Hacer ms ejercicio. ? Beber menos alcohol.  El mdico puede recetarle medicamentos si los cambios en el estilo de vida no son suficientes para Museum/gallery curator la presin arterial y si: ? El nmero de arriba est por encima de 130. ? El nmero de abajo est por encima de 80.  Su presin arterial personal ideal puede variar. Siga estas instrucciones en su casa: Comida y bebida  Si se lo dicen, siga el plan de alimentacin de DASH (Dietary Approaches to Stop Hypertension, Maneras de alimentarse para detener la hipertensin). Para seguir este plan: ? Llene la mitad del plato de cada comida con frutas y verduras. ? Llene un cuarto del plato de cada comida con cereales integrales. Los cereales integrales  incluyen pasta integral, arroz integral y pan integral. ? Coma y beba productos lcteos con bajo contenido de grasa, como leche descremada o yogur bajo en grasas. ? Llene un cuarto del plato de cada comida con protenas bajas en grasa (magras). Las protenas bajas en grasa incluyen pescado, pollo sin piel, huevos, frijoles y tofu. ? Evite consumir carne grasa,  carne curada y procesada, o pollo con piel. ? Evite consumir alimentos prehechos o procesados.  Consuma menos de 1500 mg de sal por da.  No beba alcohol si: ? El mdico le indica que no lo haga. ? Est embarazada, puede estar embarazada o est tratando de quedar embarazada.  Si bebe alcohol: ? Limite la cantidad que bebe a lo siguiente:  De 0 a 1 medida por da para las mujeres.  De 0 a 2 medidas por da para los hombres. ? Est atento a la cantidad de alcohol que hay en las bebidas que toma. En los [place], una medida equivale a una botella de cerveza de 12oz ( ), un vaso de vino de 5oz ( ) o un vaso de una bebida alcohlica de alta graduacin de 1oz (46ml).   Estilo de vida  Trabaje con su mdico para mantenerse en un peso saludable o para perder peso. Pregntele a su mdico cul es el peso recomendable para usted.  Haga al menos de ejercicio la [employer]V de la [place]. Estos pueden incluir caminar, nadar o andar en bicicleta.  Realice al menos 30 minutos de ejercicio que fortalezca sus msculos (ejercicios de resistencia) al menos 3 das a la [place]. Estos pueden incluir levantar pesas o hacer [place].  No consuma ningn producto que contenga nicotina o tabaco, como cigarrillos, cigarrillos electrnicos y tabaco de Theatre manager. Si necesita ayuda para dejar de fumar, consulte al [employer].  Controle su presin arterial en su casa tal como le indic el mdico.  Concurra a todas las visitas de seguimiento como se lo haya indicado el mdico. Esto es importante.   Medicamentos  [employer] de venta libre y los recetados solamente como se lo haya indicado el mdico. Siga cuidadosamente las indicaciones.  No omita las dosis de medicamentos para la presin arterial. Los medicamentos pierden eficacia si omite dosis. El hecho de omitir las dosis tambin [place] el riesgo de otros problemas.  Pregntele a su mdico a qu efectos secundarios o  reacciones a los Museum/gallery curator. Comunquese con un mdico si:  Piensa que tiene [place] reaccin a los medicamentos que est tomando.  Tiene dolores de cabeza frecuentes (recurrentes).  Se siente mareado.  Tiene hinchazn en los tobillos.  Tiene problemas de visin. Solicite ayuda inmediatamente si:  Siente un dolor de cabeza muy intenso.  Empieza a sentirse desorientado (confundido).  Se siente dbil o adormecido.  Siente que va a desmayarse.  Tiene un dolor muy intenso en las siguientes zonas: ? Pecho. ? Vientre (abdomen).  Vomita ms de una vez.  Tiene dificultad para respirar. Resumen  El trmino hipertensin es otra forma de denominar a la presin arterial elevada.  La presin arterial elevada fuerza al corazn a trabajar ms para bombear la sangre.  Para la [employer], una presin arterial normal es menor que 120/80.  Las decisiones saludables pueden ayudarle a disminuir su presin arterial. Si no puede bajar su presin arterial mediante decisiones saludables, es posible que deba tomar medicamentos. Esta informacin no tiene Theme park manager el consejo del mdico. Asegrese de hacerle al mdico  cualquier pregunta que tenga. Document Revised: 01/19/2018 Document Reviewed: 01/19/2018 Elsevier Patient Education  2021 [employer].

## 2020-05-05 NOTE — Progress Notes (Signed)
Establish care  BP meds

## 2020-05-05 NOTE — Progress Notes (Signed)
Virtual Visit via Telephone Note  I connected with Alexis Potts, on 05/05/2020 at 9:16 AM by telephone due to the COVID-19 pandemic and verified that I am speaking with the correct person using two identifiers.  Due to current restrictions/limitations of in-office visits due to the COVID-19 pandemic, this scheduled clinical appointment was converted to a telehealth visit.   Consent: I discussed the limitations, risks, security and privacy concerns of performing an evaluation and management service by telephone and the availability of in person appointments. I also discussed with the patient that there may be a patient responsible charge related to this service. The patient expressed understanding and agreed to proceed.   Location of Patient: Home  Location of Provider: [facility]   Persons participating in Telemedicine visit: Barbette Mcglaun, NP Margorie John, CMA [employer] Interpreter Name: Sherlie Ban: [id number]  History of Present Illness: Alexis Potts is to establish care. Patient has a PMH significant for hypertension.  Current issues and/or concerns:  1. HYPERTENSION FOLLOW-UP: 10/26/2019: Visit at [facility]. Patient with pre-existing essential hypertension during pregnancy in third trimester and chronic hypertension. Continued on Amlodipine.  05/05/2020: Currently taking: see medication list Med Adherence: [x]  Yes    []  No Medication side effects: []  Yes    [x]  No Adherence with salt restriction: [x]  Yes    []  No Exercise: Yes []  No [x]  Home Monitoring?: []  Yes    [x]  No Monitoring Frequency: []  Yes    [x]  No Home BP results range: []  Yes    [x]  No Smoking []  Yes [x]  No  SOB? []  Yes    [x]  No Chest Pain?: []  Yes    [x]  No Leg swelling?: []  Yes    [x]  No Headaches?: []  Yes    [x]  No Dizziness? []  Yes    [x]  No  ROS per  HPI   Health Maintenance:  Health Maintenance Due  Topic Date Due  . Hepatitis C Screening  Never done  . COVID-19 Vaccine (1) Never done  . INFLUENZA VACCINE  11/18/2019      Past Medical History:  Diagnosis Date  . Anemia   . Hypertension   . SAB (spontaneous abortion) 06/22/2018   No Known Allergies  Current Outpatient Medications on File Prior to Visit  Medication Sig Dispense Refill  . amLODipine (NORVASC) 10 MG tablet Take 1 tablet (10 mg total) by mouth daily. 30 tablet 1  . norethindrone (MICRONOR) 0.35 MG tablet Take 1 tablet (0.35 mg total) by mouth daily. 30 tablet 11  . Prenatal Vit-Fe Fumarate-FA (PREPLUS) 27-1 MG TABS Take 1 tablet by mouth daily. 30 tablet 6  . ibuprofen (ADVIL) 600 MG tablet Take 1 tablet (600 mg total) by mouth every 6 (six) hours. 30 tablet 0   No current facility-administered medications on file prior to visit.    Observations/Objective: Alert and oriented x 3. Not in acute distress. Physical examination not completed as this is a telemedicine visit.  Assessment and Plan: 1. Encounter to establish care: - Patient presents today to establish care.  - Return in 4 to 6 weeks or sooner if needed for annual physical examination, labs, and health maintenance. Arrive fasting meaning having had no food and/or nothing to drink for at least 8 hours prior to appointment.  2. Essential hypertension: - Level of blood pressure control unknown as patient does not monitor blood pressure at  home. Patient reports she does have a blood pressure monitor at home. Counseled to check blood pressure at home, write down the date, and blood pressure reading and bring to next in-person visit. - Continue Amlodipine as prescribed.  - Counseled on blood pressure goal of less than 130/80, low-sodium, DASH diet, medication compliance, 150 minutes of moderate intensity exercise per week as tolerated. Discussed medication compliance, adverse effects. - Follow-up for in-person  blood pressure check within 4 weeks or sooner if needed. - amLODipine (NORVASC) 10 MG tablet; Take 1 tablet (10 mg total) by mouth daily.  Dispense: 90 tablet; Refill: 0  3. Postpartum care following vaginal delivery: - Patient requests refill. - Continue Prenatal Vit-Fe Fumarate as prescribed. - Prenatal Vit-Fe Fumarate-FA (PREPLUS) 27-1 MG TABS; Take 1 tablet by mouth daily.  Dispense: 30 tablet; Refill: 6  4. Financial difficulties: - Offered patient [place] financial discount/orange card and blue card application. Counseled patient will need to have an appointment with the financial counselor for processing of documentation. Patient agreeable.   5. Language barrier: - [employer] participated during today's call.  Interpreter Name: Gunnar Bulla, ID#: [id number].  Follow Up Instructions: Follow-up with primary provider in 4 to 6 weeks or sooner if needed.   Patient was given clear instructions to go to Emergency Department or return to medical center if symptoms don't improve, worsen, or new problems develop.The patient verbalized understanding.  I discussed the assessment and treatment plan with the patient. The patient was provided an opportunity to ask questions and all were answered. The patient agreed with the plan and demonstrated an understanding of the instructions.   The patient was advised to call back or seek an in-person evaluation if the symptoms worsen or if the condition fails to improve as anticipated.   I provided 21 minutes total of non-face-to-face time during this encounter including median intraservice time, reviewing previous notes, labs, imaging, medications, management and patient verbalized understanding.    Rema Fendt, NP  [facility] [place], [place] [phone] 05/05/2020, 9:16 AM

## 2020-05-23 ENCOUNTER — Ambulatory Visit: Payer: Self-pay | Attending: Family Medicine

## 2020-05-23 ENCOUNTER — Other Ambulatory Visit: Payer: Self-pay

## 2020-06-19 ENCOUNTER — Other Ambulatory Visit: Payer: Self-pay | Admitting: Obstetrics & Gynecology

## 2020-06-19 DIAGNOSIS — I10 Essential (primary) hypertension: Secondary | ICD-10-CM

## 2020-06-23 ENCOUNTER — Ambulatory Visit (INDEPENDENT_AMBULATORY_CARE_PROVIDER_SITE_OTHER): Payer: Self-pay | Admitting: Family

## 2020-06-23 ENCOUNTER — Encounter: Payer: Self-pay | Admitting: Family

## 2020-06-23 ENCOUNTER — Other Ambulatory Visit: Payer: Self-pay

## 2020-06-23 VITALS — BP 113/77 | HR 77 | Ht 61.77 in | Wt 151.6 lb

## 2020-06-23 DIAGNOSIS — Z789 Other specified health status: Secondary | ICD-10-CM

## 2020-06-23 DIAGNOSIS — Z1159 Encounter for screening for other viral diseases: Secondary | ICD-10-CM

## 2020-06-23 DIAGNOSIS — I10 Essential (primary) hypertension: Secondary | ICD-10-CM

## 2020-06-23 DIAGNOSIS — Z130 Encounter for screening for diseases of the blood and blood-forming organs and certain disorders involving the immune mechanism: Secondary | ICD-10-CM

## 2020-06-23 DIAGNOSIS — Z13228 Encounter for screening for other metabolic disorders: Secondary | ICD-10-CM

## 2020-06-23 DIAGNOSIS — Z13220 Encounter for screening for lipoid disorders: Secondary | ICD-10-CM

## 2020-06-23 DIAGNOSIS — Z1329 Encounter for screening for other suspected endocrine disorder: Secondary | ICD-10-CM

## 2020-06-23 DIAGNOSIS — Z0000 Encounter for general adult medical examination without abnormal findings: Secondary | ICD-10-CM

## 2020-06-23 DIAGNOSIS — Z131 Encounter for screening for diabetes mellitus: Secondary | ICD-10-CM

## 2020-06-23 MED ORDER — AMLODIPINE BESYLATE 10 MG PO TABS: 10.0000 mg | ORAL_TABLET | Freq: Every day | ORAL | 0 refills | Status: DC

## 2020-06-23 NOTE — Progress Notes (Signed)
Patient ID: Alexis Potts, female    DOB: [birth year]  MRN: [id number]  CC: Annual Physical Exam  Subjective: Alexis Potts is a 35 y.o. female who presents for annual physical exam.  1. HYPERTENSION FOLLOW-UP: 05/05/2020: - Level of blood pressure control unknown as patient does not monitor blood pressure at home. Patient reports she does have a blood pressure monitor at home. Counseled to check blood pressure at home, write down the date, and blood pressure reading and bring to next in-person visit. - Continue Amlodipine as prescribed.   06/23/2020: Currently taking: see medication list  Med Adherence: [x]  Yes    []  No Medication side effects: []  Yes    [x]  No Adherence with salt restriction (low-salt diet): [x]  Yes    []  No Exercise: Yes []  No [x]  Home Monitoring?: [x]  Yes    []  No Monitoring Frequency: [x]  Yes Home BP results range: [x]  Yes, 110's/70's Smoking []  Yes [x]  No SOB? []  Yes    [x]  No Chest Pain?: []  Yes    [x]  No Leg swelling?: []  Yes    [x]  No Headaches?: []  Yes    [x]  No Dizziness? []  Yes    [x]  No Comments:   Patient Active Problem List   Diagnosis Date Noted  . Language barrier 08/13/2019     Current Outpatient Medications on File Prior to Visit  Medication Sig Dispense Refill  . ibuprofen (ADVIL) 600 MG tablet Take 1 tablet (600 mg total) by mouth every 6 (six) hours. 30 tablet 0  . norethindrone (MICRONOR) 0.35 MG tablet Take 1 tablet (0.35 mg total) by mouth daily. 30 tablet 11  . Prenatal Vit-Fe Fumarate-FA (PREPLUS) 27-1 MG TABS Take 1 tablet by mouth daily. 30 tablet 6   No current facility-administered medications on file prior to visit.    No Known Allergies  Social History   Socioeconomic History  . Marital status: Single    Spouse name: Not on file  . Number of children: Not on file  . Years of education: Not on file  . Highest education level: Not on file  Occupational History  . Not on file  Tobacco Use   . Smoking status: Never Smoker  . Smokeless tobacco: Never Used  Vaping Use  . Vaping Use: Never used  Substance and Sexual Activity  . Alcohol use: Not Currently  . Drug use: Never  . Sexual activity: Yes    Birth control/protection: None  Other Topics Concern  . Not on file  Social History Narrative  . Not on file   Social Determinants of Health   Financial Resource Strain: Not on file  Food Insecurity: No Food Insecurity  . Worried About in the Last Year: Never true  . Ran Out of Food in the Last Year: Never true  Transportation Needs: No Transportation Needs  . Lack of Transportation (Medical): No  . Lack of Transportation (Non-Medical): No  Physical Activity: Not on file  Stress: Not on file  Social Connections: Not on file  Intimate Partner Violence: Not on file    Family History  Problem Relation Age of Onset  . Diabetes Brother     Past Surgical History:  Procedure Laterality Date  . NO PAST SURGERIES      ROS: Review of Systems Negative except as stated above  PHYSICAL EXAM: BP 113/77 (BP Location: Left Arm, Patient Position: Sitting)   Pulse 77   Ht 5' 1.77" (1.569 m)  Wt 151 lb 9.6 oz (68.8 kg)   SpO2 98%   Breastfeeding Yes   BMI 27.93 kg/m   Physical Exam Exam conducted with a chaperone present.  Constitutional:      Appearance: She is overweight.  HENT:     Head: Normocephalic and atraumatic.     Right Ear: Tympanic membrane, ear canal and external ear normal.     Left Ear: Tympanic membrane, ear canal and external ear normal.     Nose: Nose normal.     Mouth/Throat:     Mouth: Mucous membranes are moist.     Pharynx: Oropharynx is clear.  Eyes:     Extraocular Movements: Extraocular movements intact.     Conjunctiva/sclera: Conjunctivae normal.     Pupils: Pupils are equal, round, and reactive to light.  Cardiovascular:     Rate and Rhythm: Normal rate and regular rhythm.     Pulses: Normal pulses.     Heart  sounds: Normal heart sounds.  Pulmonary:     Effort: Pulmonary effort is normal.     Breath sounds: Normal breath sounds.  Chest:  Breasts:     Right: Normal.     Left: Normal.      Comments: Margorie John, CMA present during examination.  Abdominal:     General: Abdomen is flat. Bowel sounds are normal.     Palpations: Abdomen is soft.  Genitourinary:    Comments: Patient declined examination.  Musculoskeletal:        General: Normal range of motion.     Cervical back: Normal range of motion and neck supple.  Skin:    General: Skin is warm.     Capillary Refill: Capillary refill takes less than 2 seconds.  Neurological:     General: No focal deficit present.     Mental Status: She is alert and oriented to person, place, and time.  Psychiatric:        Mood and Affect: Mood normal.        Behavior: Behavior normal.    ASSESSMENT AND PLAN: 1. Annual physical exam: - Counseled on 150 minutes of exercise per week as tolerated, healthy eating (including decreased daily intake of saturated fats, cholesterol, added sugars, sodium), STI prevention, and routine healthcare maintenance.  2. Screening for metabolic disorder: - CMP to check kidney function, liver function, and electrolyte balance.  - Comprehensive metabolic panel  3. Screening for deficiency anemia: - CBC to screen for anemia. - CBC  4. Diabetes mellitus screening: - Hemoglobin A1c to screen for pre-diabetes/diabetes. - Hemoglobin A1c  5. Screening cholesterol level: - Lipid panel to screen for high cholesterol.  - Lipid Panel  6. Thyroid disorder screen: - TSH to check thyroid function.  - TSH+T4F+T3Free  7. Need for hepatitis C screening test: - HCV antibody to screen for hepatitis C.  - HCV Ab w/Rflx to Verification  8. Essential hypertension: - Blood pressure at goal during today's visit.  - Home blood pressures are at goal.  - Continue Amlodipine as prescribed.  - Counseled on blood pressure  goal of less than 130/80, low-sodium, DASH diet, medication compliance, 150 minutes of moderate intensity exercise per week as tolerated. Discussed medication compliance, adverse effects. - Follow-up with primary provider in 3 months or sooner if needed.  - amLODipine (NORVASC) 10 MG tablet; Take 1 tablet (10 mg total) by mouth daily.  Dispense: 90 tablet; Refill: 0  9. Language barrier: - [employer] participated during today's visit. Interpreter Name: Byrd Hesselbach,  ID#: [id number]  Patient was given the opportunity to ask questions.  Patient verbalized understanding of the plan and was able to repeat key elements of the plan. Patient was given clear instructions to go to Emergency Department or return to medical center if symptoms don't improve, worsen, or new problems develop.The patient verbalized understanding.    Orders Placed This Encounter  Procedures  . Comprehensive metabolic panel  . CBC  . Hemoglobin A1c  . Lipid Panel  . TSH+T4F+T3Free  . HCV Ab w/Rflx to Verification     Requested Prescriptions   Signed Prescriptions Disp Refills  . amLODipine (NORVASC) 10 MG tablet 90 tablet 0    Sig: Take 1 tablet (10 mg total) by mouth daily.    Return in about 3 months (around 09/23/2020) for Follow-Up hypertension .  Rema Fendt, NP

## 2020-06-23 NOTE — Patient Instructions (Addendum)
Annual physical exam and labs today. Will call with results.   Continue Amlodipine for high blood pressure. Follow-up in 3 months or sooner if needed.   Beano over-the-counter for gas and bloating.  Women's multi-vitamin over-the counter.   Examen fsico anual y laboratorios hoy. Llamar con resultados.  Contine con amlodipino para la presin arterial alta. Seguimiento en 3 meses o antes si es necesario.  Beano de venta libre para gases e hinchazn.  Multivitamnico para mujeres de [employer].  Cuidados preventivos en las mujeres de 21 a 59 aos de edad Preventive Care 40-73 Years Old, Female Los cuidados preventivos hacen referencia a las opciones en cuanto al estilo de vida y a las visitas al mdico, las cuales pueden promover la salud y Musician. Esto puede comprender lo siguiente:  Un examen fsico anual. Esto tambin se conoce como visita de control de bienestar anual.  Exmenes dentales y oculares de [place] regular.  Vacunas.  Estudios para Health and safety inspector.  Elecciones para un estilo de vida saludable, por ejemplo: ? Seguir una dieta saludable. ? Practicar actividad fsica con regularidad. ? No consumir drogas ni productos que contengan nicotina y tabaco. ? Limitar el consumo de bebidas alcohlicas. Qu puedo esperar para mi visita de cuidado preventivo? Examen fsico El mdico puede controlar lo siguiente:  Cassell Clement y [place]. Estos pueden usarse para calcular el IMC (ndice de masa corporal). [facility] es una medicin que indica si tiene un peso saludable.  Frecuencia cardaca y presin arterial.  Temperatura corporal.  Piel para detectar manchas anormales. Asesoramiento Su mdico puede preguntarle acerca de:  Problemas mdicos pasados.  Antecedentes mdicos familiares.  Consumo de tabaco, alcohol y drogas.  Su bienestar emocional.  Restaurant manager, fast food y las relaciones personales.  Su actividad sexual.  Hbitos de alimentacin,  ejercicio y sueo.  Su trabajo y [place] laboral.  Acceso a armas de fuego.  Mtodos anticonceptivos.  Ciclo menstrual.  Antecedentes de embarazo. Qu vacunas necesito? Las vacunas se aplican a varias edades, segn un calendario. El [employer] recomendar vacunas segn su edad, sus antecedentes mdicos, su estilo de vida y otros factores, como los viajes o el lugar donde trabaja.   Qu pruebas necesito? Anlisis de [employer] de lpidos y colesterol. Estos se pueden verificar cada 5 aos, a partir de los 52 aos de [place].  Anlisis de hepatitisC.  Anlisis de hepatitis B. Pruebas de deteccin  Pruebas de deteccin de la diabetes. Esto se Set designer un control del azcar en la sangre (glucosa) despus de no haber comido durante un periodo de tiempo (ayuno).  Pruebas de enfermedades de transmisin sexual (ETS), si est en riesgo.  Pruebas de deteccin de cncer relacionado con las mutaciones del BRCA. Es posible que se las deba realizar si tiene antecedentes de cncer de mama, de ovario, de trompas o peritoneal.  Examen plvico y prueba de Papanicolaou. Esto se puede realizar cada 50aos a Renato Gails de los 21aos de edad. A partir de los 30 aos, esto se puede Optometrist cada 5 aos si usted se realiza una prueba de Papanicolaou en combinacin con una prueba de deteccin del virus del papiloma humano (VPH). Hable con su mdico [employer], las opciones de tratamiento y, si corresponde, la necesidad de Optometrist ms pruebas.   Siga estas instrucciones en su casa: Comida y bebida  Siga una dieta saludable que incluya frutas y verduras frescas, cereales integrales, protenas magras y productos lcteos descremados.  [employer]  los suplementos vitamnicos y [employer] se lo haya indicado el mdico.  No beba alcohol si: ? Su mdico le indica no hacerlo. ? Est embarazada, puede estar embarazada o est tratando de quedar embarazada.  Si bebe  alcohol: ? Limite la cantidad que consume de 0 a 1 medida por da. ? Est atenta a la cantidad de alcohol que hay en las bebidas que toma. En los [place], una medida equivale a una botella de cerveza de 12oz (363m), un vaso de vino de 5oz (1418m o un vaso de una bebida alcohlica de alta graduacin de 1oz (4487m   Estilo de vidNavistar International Corporationlas encas a diario. Cepllese los dientes a la maana y a la noche con pasta dental con fluoruro. Use hilo dental una vez al da.  Mantngase activa. Haga al menos 30 minutos de ejercicio, 5 o ms das cada semana.  No consuma ningn producto que contenga nicotina o tabaco, como cigarrillos, cigarrillos electrnicos y tabaco de masHigher education careers adviseri necesita ayuda para dejar de fumar, consulte al mdico.  No consuma drogas.  Si es sexualmente activa, practique sexo seguro. Use un condn u otra forma de proteccin para prevenir las ITS (infecciones de transmisin sexual).  Si no desea quedar embarazada, use un mtodo anticonceptivo. Si busca un embarazo, realice una consulta previa al embarazo con el mdico.  Encuentre formas saludables de lidiar con el estrs tales como: ? Meditacin, yoga o escuchar msica. ? Lleve un diario personal. ? Hable con una persona confiable. ? Pase tiempo con amigos y familiares. Seguridad  [place]empre el cinturn de seguridad al conducir o viajar en un vehculo.  No conduzca: ? Si ha estado bebiendo alcohol. No viaje con un conductor que ha estado bebiendo. ? Si est cansada o distrada. ? Mientras est enviando mensajes de texto.  Use un casco y otros equipos de proteccin durEflandportivas.  Si tiene armas de fuego en su casa, asegrese de seguir todos los procedimientos de seguridad correspondientes.  Busque ayuda si fue vctima de abuso fsico o abuso sexual. Cundo volver?  Acuda al mdico una vez al ao para una visita anual de control de bienestar.  Pregntele al  mdico con qu frecuencia debe realizarse un control de la vista y los dientes.  Mantenga su esquema de vacunacin al da. Esta informacin no tiene comMarine scientist consejo del mdico. Asegrese de hacerle al mdico cualquier pregunta que tenga. Document Revised: 01/31/2020 Document Reviewed: 03/13/2019 Elsevier Patient Education  2021 [employer]

## 2020-06-23 NOTE — Progress Notes (Signed)
Annual physical  Bloating and gas for about 7 months

## 2020-06-24 LAB — HEMOGLOBIN A1C
Est. average glucose Bld gHb Est-mCnc: 105 mg/dL
Hgb A1c MFr Bld: 5.3 % (ref 4.8–5.6)

## 2020-06-24 LAB — CBC
Hematocrit: 42.1 % (ref 34.0–46.6)
Hemoglobin: 13.7 g/dL (ref 11.1–15.9)
MCH: 30.2 pg (ref 26.6–33.0)
MCHC: 32.5 g/dL (ref 31.5–35.7)
MCV: 93 fL (ref 79–97)
Platelets: 200 10*3/uL (ref 150–450)
RBC: 4.54 x10E6/uL (ref 3.77–5.28)
RDW: 13 % (ref 11.7–15.4)
WBC: 4.7 10*3/uL (ref 3.4–10.8)

## 2020-06-24 LAB — COMPREHENSIVE METABOLIC PANEL
ALT: 16 IU/L (ref 0–32)
AST: 15 IU/L (ref 0–40)
Albumin/Globulin Ratio: 1.5 (ref 1.2–2.2)
Albumin: 4.6 g/dL (ref 3.8–4.8)
Alkaline Phosphatase: 69 IU/L (ref 44–121)
BUN/Creatinine Ratio: 18 (ref 9–23)
BUN: 9 mg/dL (ref 6–20)
Bilirubin Total: 0.3 mg/dL (ref 0.0–1.2)
CO2: 18 mmol/L — ABNORMAL LOW (ref 20–29)
Calcium: 9.2 mg/dL (ref 8.7–10.2)
Chloride: 103 mmol/L (ref 96–106)
Creatinine, Ser: 0.49 mg/dL — ABNORMAL LOW (ref 0.57–1.00)
Globulin, Total: 3 g/dL (ref 1.5–4.5)
Glucose: 94 mg/dL (ref 65–99)
Potassium: 4.3 mmol/L (ref 3.5–5.2)
Sodium: 137 mmol/L (ref 134–144)
Total Protein: 7.6 g/dL (ref 6.0–8.5)
eGFR: 128 mL/min/{1.73_m2} (ref >59)

## 2020-06-24 LAB — LIPID PANEL
Chol/HDL Ratio: 4.6 ratio — ABNORMAL HIGH (ref 0.0–4.4)
Cholesterol, Total: 219 mg/dL — ABNORMAL HIGH (ref 100–199)
HDL: 48 mg/dL (ref >39)
LDL Chol Calc (NIH): 141 mg/dL — ABNORMAL HIGH (ref 0–99)
Triglycerides: 169 mg/dL — ABNORMAL HIGH (ref 0–149)
VLDL Cholesterol Cal: 30 mg/dL (ref 5–40)

## 2020-06-24 LAB — TSH+T4F+T3FREE
Free T4: 0.97 ng/dL (ref 0.82–1.77)
T3, Free: 3.1 pg/mL (ref 2.0–4.4)
TSH: 1.23 u[IU]/mL (ref 0.450–4.500)

## 2020-06-24 LAB — HCV AB W/RFLX TO VERIFICATION: HCV Ab: <0.1 s/co ratio (ref 0.0–0.9)

## 2020-06-24 LAB — HCV INTERPRETATION

## 2020-06-24 NOTE — Progress Notes (Signed)
Kidney function normal.   Liver function normal.   Thyroid normal.   No anemia.   No diabetes.   Hepatitis C negative.   Cholesterol higher than expected. High cholesterol may increase risk of heart attack and/or stroke. Consider eating more fruits, vegetables, and lean baked meats such as chicken or fish. Moderate intensity exercise at least 150 minutes as tolerated per week may help as well. Patient encouraged to schedule appointment to have cholesterol rechecked within 3 to 6 months.

## 2020-06-26 ENCOUNTER — Other Ambulatory Visit: Payer: Self-pay | Admitting: Family Medicine

## 2020-06-26 ENCOUNTER — Other Ambulatory Visit: Payer: Self-pay | Admitting: *Deleted

## 2020-06-26 DIAGNOSIS — I10 Essential (primary) hypertension: Secondary | ICD-10-CM

## 2020-06-26 MED ORDER — AMLODIPINE BESYLATE 10 MG PO TABS: 10.0000 mg | ORAL_TABLET | Freq: Every day | ORAL | 0 refills | Status: DC

## 2020-09-22 NOTE — Progress Notes (Signed)
Patient ID: Alexis Potts, female    DOB: 1986/04/18  MRN: [id number]  CC: Hypertension Follow-Up   Subjective: Alexis Potts is a 35 y.o. female who presents for hypertension follow-up.   Her concerns today include:    1. HYPERTENSION FOLLOW-UP: 06/23/2020:  - Continue Amlodipine as prescribed.   - Follow-up with primary provider in 3 months or sooner if needed.   09/23/2020: Currently taking: see medication list Med Adherence: [x]  Yes    []  No Medication side effects: []  Yes    [x]  No Adherence with salt restriction (low-salt diet): [x]  Yes    []  No Home Monitoring?: [x]  Yes    []  No Monitoring Frequency: []  Yes    [x]  No Home BP results range: 110's/70's SOB? []  Yes    [x]  No Chest Pain?: []  Yes    [x]  No  2. PREGNANCY TEST: Reports first positive pregnancy test was 3 weeks ago. LMP 07/09/2020.    Patient Active Problem List   Diagnosis Date Noted  . Language barrier 08/13/2019     Current Outpatient Medications on File Prior to Visit  Medication Sig Dispense Refill  . ibuprofen (ADVIL) 600 MG tablet Take 1 tablet (600 mg total) by mouth every 6 (six) hours. 30 tablet 0  . norethindrone (MICRONOR) 0.35 MG tablet Take 1 tablet (0.35 mg total) by mouth daily. 30 tablet 11  . Prenatal Vit-Fe Fumarate-FA (PREPLUS) 27-1 MG TABS Take 1 tablet by mouth daily. 30 tablet 6   No current facility-administered medications on file prior to visit.    No Known Allergies  Social History   Socioeconomic History  . Marital status: Single    Spouse name: Not on file  . Number of children: Not on file  . Years of education: Not on file  . Highest education level: Not on file  Occupational History  . Not on file  Tobacco Use  . Smoking status: Never Smoker  . Smokeless tobacco: Never Used  Vaping Use  . Vaping Use: Never used  Substance and Sexual Activity  . Alcohol use: Not Currently  . Drug use: Never  . Sexual activity: Yes    Birth  control/protection: None  Other Topics Concern  . Not on file  Social History Narrative  . Not on file   Social Determinants of Health   Financial Resource Strain: Not on file  Food Insecurity: Not on file  Transportation Needs: Not on file  Physical Activity: Not on file  Stress: Not on file  Social Connections: Not on file  Intimate Partner Violence: Not on file    Family History  Problem Relation Age of Onset  . Diabetes Brother     Past Surgical History:  Procedure Laterality Date  . NO PAST SURGERIES      ROS: Review of Systems Negative except as stated above  PHYSICAL EXAM: BP 122/85 (BP Location: Left Arm, Patient Position: Sitting, Cuff Size: Normal)   Pulse 84   Temp 98.2 F (36.8 C)   Resp 15   Ht 5' 1.77" (1.569 m)   Wt 146 lb 14.4 oz (66.6 kg)   SpO2 98%   BMI 27.07 kg/m   Physical Exam HENT:     Head: Normocephalic and atraumatic.  Eyes:     Extraocular Movements: Extraocular movements intact.     Conjunctiva/sclera: Conjunctivae normal.     Pupils: Pupils are equal, round, and reactive to light.  Cardiovascular:     Rate and Rhythm: Normal  rate and regular rhythm.     Pulses: Normal pulses.     Heart sounds: Normal heart sounds.  Pulmonary:     Effort: Pulmonary effort is normal.     Breath sounds: Normal breath sounds.  Musculoskeletal:     Cervical back: Normal range of motion and neck supple.  Neurological:     General: No focal deficit present.     Mental Status: She is alert and oriented to person, place, and time.  Psychiatric:        Mood and Affect: Mood normal.        Behavior: Behavior normal.    Results for orders placed or performed in visit on 09/23/20  POCT urine pregnancy  Result Value Ref Range   Preg Test, Ur Positive (A) Negative    ASSESSMENT AND PLAN: 1. Essential hypertension: - Blood pressure at goal.  - Amlodipine discontinued as patient is pregnant.  - Begin Labetalol as prescribed.  - Follow-up with  Obstetrics / Gynecology for further management.  - labetalol (NORMODYNE) 100 MG tablet; Take 1 tablet (100 mg total) by mouth 2 (two) times daily.  Dispense: 60 tablet; Refill: 0  2. Positive pregnancy test: - LMP: 07/09/2020. - EDD: 04/17/2021 - Urine pregnancy positive.  - Referral to Obstetrics/Gynecology for managment.  - POCT urine pregnancy - Ambulatory referral to Obstetrics / Gynecology  3. Encounter for routine dental examination:  - Referral to Dentistry for further evaluation and management.  - Ambulatory referral to Dentistry  4. Language barrier: - [employer] participated during today's visit. Interpreter Name: Georgeanna Harrison, ID#: [id number]. Interpreter Name: Aram Beecham, ID#: [id number].   Patient was given the opportunity to ask questions.  Patient verbalized understanding of the plan and was able to repeat key elements of the plan. Patient was given clear instructions to go to Emergency Department or return to medical center if symptoms don't improve, worsen, or new problems develop.The patient verbalized understanding.   Orders Placed This Encounter  Procedures  . POCT urine pregnancy     Requested Prescriptions   Signed Prescriptions Disp Refills  . labetalol (NORMODYNE) 100 MG tablet 60 tablet 0    Sig: Take 1 tablet (100 mg total) by mouth 2 (two) times daily.   Follow-up with Obstetrics / Gynecology.   Rema Fendt, NP

## 2020-09-23 ENCOUNTER — Encounter: Payer: Self-pay | Admitting: Family

## 2020-09-23 ENCOUNTER — Other Ambulatory Visit: Payer: Self-pay

## 2020-09-23 ENCOUNTER — Ambulatory Visit (INDEPENDENT_AMBULATORY_CARE_PROVIDER_SITE_OTHER): Payer: Self-pay | Admitting: Family

## 2020-09-23 VITALS — BP 122/85 | HR 84 | Temp 98.2°F | Resp 15 | Ht 61.77 in | Wt 146.9 lb

## 2020-09-23 DIAGNOSIS — Z0120 Encounter for dental examination and cleaning without abnormal findings: Secondary | ICD-10-CM

## 2020-09-23 DIAGNOSIS — Z789 Other specified health status: Secondary | ICD-10-CM

## 2020-09-23 DIAGNOSIS — I10 Essential (primary) hypertension: Secondary | ICD-10-CM

## 2020-09-23 DIAGNOSIS — Z3201 Encounter for pregnancy test, result positive: Secondary | ICD-10-CM

## 2020-09-23 LAB — POCT URINE PREGNANCY: Preg Test, Ur: POSITIVE — AB

## 2020-09-23 MED ORDER — LABETALOL HCL 100 MG PO TABS
100.0000 mg | ORAL_TABLET | Freq: Two times a day (BID) | ORAL | 0 refills | Status: DC
  Filled 2020-09-23: qty 60, 30d supply, fill #0

## 2020-09-23 NOTE — Progress Notes (Signed)
Hypertension Pt is approx 10-[redacted] weeks pregnant  Pt has CAFA needs dental referral for cleaning Would like a referral to OB

## 2020-09-23 NOTE — Patient Instructions (Addendum)
Referral to [facility] [place]. Address: [address] [place], [place] [address]. Phone Number: [phone].  Referral to Dentistry.   Begin Labetalol for high blood pressure.      [facility] [employer] [facility]. Direccin: [address], [place], [place] [address]. Nmero de telfono: [phone].  Derivacin a Environmental manager.  Comience con Labetalol para la presin arterial alta. Primer trimestre de Psychiatrist First Trimester of Pregnancy  El primer trimestre de Community education officer de su ltimo periodo menstrual hasta el final de la semana 12. Tambin se dice que va desde el mes 1 hasta el mes 3 de [place]. Durante Financial risk analyst trimestre, ocurren cambios en el cuerpo Su organismo atraviesa por muchos cambios durante el [place]. En general, los cambios vuelven a la normalidad despus del nacimiento del beb. Cambios fsicos  Usted puede aumentar o bajar de Needles.  Las mamas pueden agrandarse y Cabin crew. La zona que rodea los pezones puede oscurecerse.  Pueden aparecer zonas oscuras o manchas en el rostro.  Tal vez haya cambios en el cabello. Cambios en la salud  Es posible que se sienta como si fuera a vomitar (nuseas) y quizs vomite.  Es posible que tenga Merchant navy officer.  Es posible que tenga dolores de [place].  Es posible que tenga dificultades para defecar (estreimiento).  Las encas pueden sangrarle. Otros cambios  Es posible que se canse con facilidad.  Es posible que haga pis (orine) con mayor frecuencia.  Los perodos menstruales se interrumpirn.  Es posible que no tenga hambre.  Es posible que [place] comer ciertos tipos de alimentos.  Puede tener cambios en sus emociones de un da para Therapist, art.  Es posible que tenga ms sueos. Siga estas instrucciones en su casa: Medicamentos  Use los medicamentos de venta libre y los recetados solamente como se lo haya indicado el mdico. Algunos  medicamentos no son seguros Academic librarian.  Tome vitaminas prenatales que contengan por lo menos (mcg) de cido flico. Comida y bebida  Consuma comidas saludables que incluyan lo siguiente: ? Nils Pyle y verduras frescas. ? Cereales integrales. ? Buenas fuentes de protenas, como carne, huevos y tofu. ? Productos lcteos con bajo contenido de grasa.  Evite la carne cruda y el [place], la [place] y el queso sin Market researcher.  Si se siente como si fuera a vomitar: ? Ingiera 4 o 5comidas pequeas por da en lugar de 3abundantes. ? Intente comer algunas galletitas saladas. ? Beba lquidos [employer], en lugar de [employer].  Es posible que deba tomar medidas para prevenir o tratar los problemas para defecar: ? Product manager suficiente lquido para Radio producer pis (orina) de color amarillo plido. ? Come alimentos ricos en fibra. Entre ellos, frijoles, cereales integrales y frutas y verduras frescas. ? Limitar los alimentos con alto contenido de grasa y International aid/development worker. Estos incluyen alimentos fritos o dulces. Actividad  Haga ejercicios solamente como se lo haya indicado el mdico. La mayora de las personas pueden realizar su rutina de ejercicios habitual durante el [place].  Deje de hacer ejercicios si tiene clicos o dolor en la parte baja del vientre (abdomen) o en la cintura.  No haga ejercicio si hace demasiado calor, hay demasiada humedad o se encuentra en un lugar de mucha altura (altitud elevada).  Evite levantar pesos [employer]ands.  Si lo desea, puede continuar teniendo [employer], a menos que el mdico le indique lo contrario. Alivio del dolor y del Financial trader  un sostn que le brinde buen soporte si le duelen las [place].  Descanse con las piernas levantadas (elevadas) si tiene calambres en las piernas o dolor en la parte baja de la espalda.  Si tiene las venas de las piernas abultadas (venas varicosas): ? Use medias de compresin segn las  indicaciones de su mdico. ? Levante los pies durante , 3 o 4veces por Futures trader. ? Limite la sal en sus alimentos. Seguridad  Use el cinturn de seguridad en todo momento mientras vaya en auto.  Hable con el mdico si alguien le est haciendo dao o gritando [place].  Hable con el mdico si se siente triste o tiene pensamientos acerca de [place] dao a usted misma. Estilo de vida  No se d baos de inmersin en agua caliente, baos turcos ni saunas.  No se haga duchas vaginales. No use tampones ni toallas higinicas perfumadas.  No consuma medicamentos a base de hierbas, drogas ilegales, ni medicamentos que el mdico no haya autorizado. No beba alcohol.  No fume ni consuma ningn producto que contenga nicotina o tabaco. Si necesita ayuda para dejar de fumar, consulte al mdico.  Evite el contacto con las bandejas sanitarias de los gatos y la tierra que estos animales usan. Estos contienen grmenes que pueden daar al beb y causar la prdida del beb ya sea aborto espontneo o muerte fetal. Instrucciones generales  Cumpla con todas las visitas de seguimiento. Esto es importante.  Pida ayuda si necesita asesoramiento o asistencia con la alimentacin. El mdico puede aconsejarla o indicarle dnde recurrir para recibir [place].  Visite al dentista. En su casa, lvese los dientes con un cepillo dental suave. Psese el hilo dental suavemente.  Escriba sus preguntas. Llvelas cuando concurra a las visitas prenatales. Dnde buscar ms informacin  American Pregnancy Association (Asociacin Americana del Embarazo): americanpregnancy.org  [employer] Corporation of Obstetricians and Gynecologists (Colegio Estadounidense de Obstetras y Gineclogos): www.acog.org  Office on [employer]es (Oficina para la Salud de la Mujer): MightyReward.co.nz Comunquese con un mdico si:  Tiene mareos.  Tiene fiebre.  Tiene clicos leves o siente presin en la parte baja del vientre.  Sufre un  dolor persistente en el abdomen.  Sigue sintiendo como si fuera a vomitar, vomita o hace deposiciones acuosas (diarrea) durante 24horas o ms.  Advierte lquido con mal olor que proviene de la vagina.  Siente dolor al [employer]oco[employer].  Se expone a una enfermedad que se transmite de [place] persona a otra, como varicela, sarampin, virus de [place], VIH o hepatitis. Solicite ayuda de inmediato si:  Tiene sangrado o pequeas prdidas vaginales.  Tiene clicos o dolor muy intensos en el vientre.  Le falta el aire o le duele el pecho.  Sufre cualquier tipo de lesin, por ejemplo, debido a una cada o un accidente automovilstico.  Tiene dolor, hinchazn o enrojecimiento nuevos en un brazo o una pierna o se produce un aumento de alguno de estos sntomas. Resumen  Financial risk analyst trimestre del Community education officer de su ltimo periodo menstrual hasta el final de la semana 12 (meses 1 al 3).  Ingiera 4 o 5comidas pequeas por Geophysical data processor de 3abundantes.  No fume ni consuma ningn producto que contenga nicotina o tabaco. Si necesita ayuda para dejar de fumar, consulte al mdico.  Cumpla con todas las visitas de seguimiento. Esta informacin no tiene Theme park manager el consejo del mdico. Asegrese de hacerle al mdico cualquier pregunta que tenga. Document Revised: 10/12/2019 Document Reviewed: 10/12/2019 Elsevier Patient Education  [place].

## 2020-10-13 ENCOUNTER — Other Ambulatory Visit: Payer: Self-pay

## 2020-10-13 ENCOUNTER — Encounter: Payer: Self-pay | Admitting: Student

## 2020-10-13 ENCOUNTER — Other Ambulatory Visit (HOSPITAL_COMMUNITY)
Admission: RE | Admit: 2020-10-13 | Discharge: 2020-10-13 | Disposition: A | Discharge status: Home / Self Care | Payer: Self-pay | Source: Ambulatory Visit | Attending: Student | Admitting: Student

## 2020-10-13 ENCOUNTER — Ambulatory Visit (INDEPENDENT_AMBULATORY_CARE_PROVIDER_SITE_OTHER): Payer: Self-pay | Admitting: Student

## 2020-10-13 VITALS — BP 108/76 | HR 77 | Wt 148.0 lb

## 2020-10-13 DIAGNOSIS — O0990 Supervision of high risk pregnancy, unspecified, unspecified trimester: Secondary | ICD-10-CM

## 2020-10-13 DIAGNOSIS — I10 Essential (primary) hypertension: Secondary | ICD-10-CM | POA: Insufficient documentation

## 2020-10-13 MED ORDER — ASPIRIN EC 81 MG PO TBEC
81.0000 mg | DELAYED_RELEASE_TABLET | Freq: Every day | ORAL | 11 refills | Status: DC
  Filled 2020-10-13: qty 30, 30d supply, fill #0

## 2020-10-13 NOTE — Progress Notes (Signed)
  Subjective:    Alexis Potts is being seen today for her first obstetrical visit.  This is a planned pregnancy. She is at [redacted]w[redacted]d gestation. Her obstetrical history is significant for  CHTN-she takes labetalol 100 mg BID . Relationship with FOB:  lives with her children and her partner is "there a lot" . Patient does intend to breast feed. Pregnancy history fully reviewed.  Patient reports no complaints.  Review of Systems:   Review of Systems  Constitutional: Negative.   HENT: Negative.    Respiratory: Negative.    Cardiovascular: Negative.   Neurological: Negative.    Objective:     BP 108/76   Pulse 77   Wt 148 lb (67.1 kg)   LMP 07/09/2020 (Exact Date)   BMI 27.27 kg/m  Physical Exam Constitutional:      Appearance: Normal appearance.  Cardiovascular:     Rate and Rhythm: Normal rate and regular rhythm.  Pulmonary:     Effort: Pulmonary effort is normal.     Breath sounds: Normal breath sounds.  Abdominal:     Palpations: Abdomen is soft.  Skin:    General: Skin is warm.  Neurological:     General: No focal deficit present.     Mental Status: She is alert.    Exam    Assessment:    Pregnancy: [id number] Patient Active Problem List   Diagnosis Date Noted   Supervision of high risk pregnancy, antepartum 10/13/2020   Language barrier 08/13/2019       Plan:     Initial labs drawn. Prenatal vitamins. Problem list reviewed and updated. AFP3 discussed:  too early, need firm dating . Role of ultrasound in pregnancy discussed; fetal survey: ordered. Amniocentesis discussed: not indicated. Follow up in 4 weeks. 80% of 30 min visit spent on counseling and coordination of care.  -does not want injection .interested in IUD, which she [facility] -patient will use [facility] for her pediatrician -started on baby ASA, explained how to take and why -continue taking labetalol, was on amlodipine prior to pregnancy -will draw pre-e labs for  baseline as she is [facility] -confirmed she has BP cuff at home -Pap not needed; she had last year -reviewed genetic testing; patient would like genetic testing and will participate in payment plan Marylene Land 10/13/2020

## 2020-10-14 LAB — CBC/D/PLT+RPR+RH+ABO+RUBIGG...
Antibody Screen: NEGATIVE
Basophils Absolute: 0 10*3/uL (ref 0.0–0.2)
Basos: 0 %
EOS (ABSOLUTE): 0.1 10*3/uL (ref 0.0–0.4)
Eos: 2 %
HCV Ab: <0.1 s/co ratio (ref 0.0–0.9)
HIV Screen 4th Generation wRfx: NONREACTIVE
Hematocrit: 37.2 % (ref 34.0–46.6)
Hemoglobin: 12.5 g/dL (ref 11.1–15.9)
Hepatitis B Surface Ag: NEGATIVE
Immature Grans (Abs): 0 10*3/uL (ref 0.0–0.1)
Immature Granulocytes: 0 %
Lymphocytes Absolute: 1.6 10*3/uL (ref 0.7–3.1)
Lymphs: 24 %
MCH: 30.5 pg (ref 26.6–33.0)
MCHC: 33.6 g/dL (ref 31.5–35.7)
MCV: 91 fL (ref 79–97)
Monocytes Absolute: 0.3 10*3/uL (ref 0.1–0.9)
Monocytes: 5 %
Neutrophils Absolute: 4.7 10*3/uL (ref 1.4–7.0)
Neutrophils: 69 %
Platelets: 266 10*3/uL (ref 150–450)
RBC: 4.1 x10E6/uL (ref 3.77–5.28)
RDW: 13.6 % (ref 11.7–15.4)
RPR Ser Ql: NONREACTIVE
Rh Factor: POSITIVE
Rubella Antibodies, IGG: 13.2 index (ref >0.99)
WBC: 6.7 10*3/uL (ref 3.4–10.8)

## 2020-10-14 LAB — COMPREHENSIVE METABOLIC PANEL
ALT: 16 IU/L (ref 0–32)
AST: 13 IU/L (ref 0–40)
Albumin/Globulin Ratio: 1.5 (ref 1.2–2.2)
Albumin: 4 g/dL (ref 3.8–4.8)
Alkaline Phosphatase: 48 IU/L (ref 44–121)
BUN/Creatinine Ratio: 12 (ref 9–23)
BUN: 5 mg/dL — ABNORMAL LOW (ref 6–20)
Bilirubin Total: 0.3 mg/dL (ref 0.0–1.2)
CO2: 18 mmol/L — ABNORMAL LOW (ref 20–29)
Calcium: 9 mg/dL (ref 8.7–10.2)
Chloride: 103 mmol/L (ref 96–106)
Creatinine, Ser: 0.41 mg/dL — ABNORMAL LOW (ref 0.57–1.00)
Globulin, Total: 2.7 g/dL (ref 1.5–4.5)
Glucose: 86 mg/dL (ref 65–99)
Potassium: 3.9 mmol/L (ref 3.5–5.2)
Sodium: 136 mmol/L (ref 134–144)
Total Protein: 6.7 g/dL (ref 6.0–8.5)
eGFR: 132 mL/min/{1.73_m2} (ref >59)

## 2020-10-14 LAB — HEMOGLOBIN A1C
Est. average glucose Bld gHb Est-mCnc: 111 mg/dL
Hgb A1c MFr Bld: 5.5 % (ref 4.8–5.6)

## 2020-10-14 LAB — GC/CHLAMYDIA PROBE AMP (~~LOC~~) NOT AT ARMC
Chlamydia: NEGATIVE
Comment: NEGATIVE
Comment: NORMAL
Neisseria Gonorrhea: NEGATIVE

## 2020-10-14 LAB — PROTEIN / CREATININE RATIO, URINE
Creatinine, Urine: 77.3 mg/dL
Protein, Ur: 10.8 mg/dL
Protein/Creat Ratio: 140 mg/g creat (ref 0–200)

## 2020-10-14 LAB — HCV INTERPRETATION

## 2020-10-15 ENCOUNTER — Other Ambulatory Visit: Payer: Self-pay

## 2020-10-15 LAB — CULTURE, OB URINE

## 2020-10-15 LAB — URINE CULTURE, OB REFLEX

## 2020-10-24 ENCOUNTER — Encounter: Payer: Self-pay | Admitting: *Deleted

## 2020-10-24 ENCOUNTER — Other Ambulatory Visit: Payer: Self-pay

## 2020-10-24 ENCOUNTER — Telehealth: Payer: Self-pay

## 2020-10-24 DIAGNOSIS — I10 Essential (primary) hypertension: Secondary | ICD-10-CM

## 2020-10-24 MED ORDER — LABETALOL HCL 100 MG PO TABS
100.0000 mg | ORAL_TABLET | Freq: Two times a day (BID) | ORAL | 0 refills | Status: DC
  Filled 2020-10-24: qty 60, 30d supply, fill #0

## 2020-10-24 NOTE — Telephone Encounter (Signed)
Pt called requesting a refill on her BP medication Labetalol.  Per [facility], pt can have refill.  Pt notified refill has been submitted to [facility].  Pt verbalized understanding.   Leonette Nutting  10/24/20

## 2020-11-04 ENCOUNTER — Encounter: Payer: Self-pay | Admitting: *Deleted

## 2020-11-10 ENCOUNTER — Ambulatory Visit (INDEPENDENT_AMBULATORY_CARE_PROVIDER_SITE_OTHER): Payer: Self-pay | Admitting: Student

## 2020-11-10 ENCOUNTER — Other Ambulatory Visit: Payer: Self-pay

## 2020-11-10 VITALS — BP 115/79 | HR 87

## 2020-11-10 DIAGNOSIS — Z3A17 17 weeks gestation of pregnancy: Secondary | ICD-10-CM

## 2020-11-10 DIAGNOSIS — O0990 Supervision of high risk pregnancy, unspecified, unspecified trimester: Secondary | ICD-10-CM

## 2020-11-10 NOTE — Patient Instructions (Signed)
Descripcin general El pene antes y despus de la circuncisin  El pene antes y despus de la circuncisinOpen pop-up dialog box La circuncisin es la extirpacin [place] de la piel que cubre la punta del pene. El procedimiento es bastante frecuente entre los recin nacidos varones de determinadas partes del mundo, incluso en los [place]. Es posible Printmaker circuncisin despus del perodo neonatal, aunque es un procedimiento ms complejo.  Para algunas familias, la circuncisin es un ritual religioso. El procedimiento tambin puede ser un tema de tradicin familiar, higiene personal o cuidado preventivo de [employer]. Sin embargo, otros consideran que la circuncisin es innecesaria o que es una desfiguracin.  Productos y servicios Libro: Social research officer, government to a Healthy Pregnancy (Gua de [facility] para tener un embarazo saludable) Mostrar ms productos de [employer] Por qu se realiza La circuncisin es un ritual religioso o cultural para muchas familias judas e islmicas, as como para ciertas tribus aborgenes de frica y [place]. La circuncisin tambin puede ser un tema de tradicin familiar, higiene personal o atencin mdica preventiva.  A veces, existe una necesidad mdica de Printmaker circuncisin; por ejemplo, cuando el prepucio est demasiado apretado como para moverlo hacia atrs (retraerlo) sobre el glande. En otros casos, especialmente en algunas partes de frica, se recomienda la circuncisin a los nios mayores o los hombres a fin de reducir el riesgo de Primary school teacher algunas infecciones de transmisin sexual.  La circuncisin podra tener diversos beneficios para la salud, como los siguientes:  Higiene ms sencilla. La circuncisin hace que sea ms simple lavar el pene. Sin embargo, a los nios no circuncidados se les puede ensear a lavarse regularmente por debajo del prepucio. Menor riesgo de Marine scientist infecciones urinarias. El riesgo de padecer de infecciones  urinarias en hombres es bajo; sin embargo, estas infecciones son ms frecuentes en los hombres no circuncidados. Las infecciones graves que se presentan en las primeras etapas de la vida pueden causar problemas renales posteriormente. Menor riesgo de Primary school teacher infecciones de transmisin sexual. Los hombres circuncidados podran tener un menor riesgo de contraer ciertas infecciones de transmisin sexual, como VIH. Igualmente, las prcticas sexuales seguras siguen siendo esenciales. Prevencin de problemas en el pene. Ocasionalmente, puede ser difcil o imposible retraer el prepucio del pene no circuncidado (fimosis). Esto puede provocar inflamacin en el prepucio o en la cabeza del pene. Menor riesgo de Geneticist, molecular de pene. Si bien el cncer de pene es poco frecuente, es menos comn en los hombres circuncidados. Asimismo, el cncer del cuello del tero es menos frecuente en las parejas sexuales femeninas de los hombres circuncidados. Los riesgos de no ser circuncidado, sin embargo, no solo son raros, sino que tambin se pueden evitar con el cuidado adecuado del pene.  La circuncisin podra no ser una opcin si existen determinados trastornos en la coagulacin de la [place]. Adems, la circuncisin podra no ser adecuada para bebs prematuros que an requieren atencin mdica en la sala de recin nacidos del hospital o los bebs que nacen con anomalas en el pene.  La circuncisin no afecta la fertilidad y, en general, no se cree que aumente o [employer] sexual para los hombres o sus parejas.  Solicite Optician, dispensing en [facility] Riesgos Las complicaciones ms frecuentes asociadas con la circuncisin son sangrado e infeccin. Tambin es posible que se presenten efectos secundarios relacionados con la anestesia.  En raras ocasiones, la circuncisin puede causar problemas en el prepucio. Por ejemplo:  Es posible que el prepucio  se corte demasiado corto o demasiado largo Es posible que el  prepucio no cicatrice correctamente La parte restante del prepucio podra volver a unirse al extremo del pene, lo cual requiere una reparacin quirrgica menor Cmo prepararse Antes de la circuncisin, el mdico [employer] riesgos y beneficios del procedimiento. Tanto si ests planeando la circuncisin de tu hijo como la tuya, es probable que tengas que proporcionar el consentimiento por escrito para el procedimiento.  Lo que puedes esperar Durante el procedimiento La circuncisin en recin nacidos suele realizarse en la sala de recin nacidos del hospital, por lo general, dentro de los [address] posteriores al nacimiento.  Para la circuncisin en recin nacidos, tu hijo estar acostado boca [place], y [employer] brazos y las piernas. Despus de limpiar el pene y el rea de alrededor, se Tourist information centre manager anestesia en la base del pene o se aplicar sobre el pene como una crema. Se colocar una pinza especial o un anillo de plstico en el pene, y se retirar el prepucio.  Despus, se recubrir el pene con un ungento, como un antibitico tpico o vaselina, y se lo Research scientist (life sciences) de Engineer, technical sales con gasa. El procedimiento, por lo general, lleva unos 10 minutos.  La circuncisin es similar para los nios ms grandes y [place]. Sin embargo, si el procedimiento se practica ms adelante, podra ser necesario llevarlo a cabo bajo anestesia general, la recuperacin podra ser ms prolongada y el riesgo de complicaciones podra ser mayor.  Despus del procedimiento Por lo general, la cicatrizacin del pene lleva entre 7 y [address]. Se puede sentir dolor en la punta del pene al principio, y el pene puede enrojecerse, hincharse o presentar moretones. Tambin podras observar una pequea cantidad de lquido amarillento en la punta del pene.  Si el beb recin nacido est molesto mientras desaparece el efecto de la anestesia, sostenlo con cuidado para Cytogeneticist el pene.  Se puede lavar el pene mientras se  cicatriza. Para un recin nacido, cambia el vendaje cada vez que le cambies el paal y aplica una pequea cantidad de vaselina en la punta del pene para evitar que se pegue en el paal. Cambia a menudo el paal de tu beb y asegrate de que no est muy ajustado.  Si se ha usado un anillo de Web designer de vendaje, este se caer solo (por lo general, en una semana). Una vez que el pene se cicatrice, lvalo con agua y jabn durante el bao normal.  Es poco frecuente que aparezcan problemas despus de la circuncisin. Comuncate con el mdico en las siguientes circunstancias:  No orina normalmente dentro de las 12 horas despus de la circuncisin Hay sangrado persistente Se observa un drenaje con mal olor de la punta del pene El anillo de plstico contina all dos semanas despus de la circuncisin Escrito por Industrial/product designer de [facility] [place] en [facility] Mdicos y departamentos March 22, 2022ImprimirComparte en: Copywriter, advertising referencias Relacionado El pene antes y despus de la circuncisin Liquen escleroso Pene no circuncidado: se necesitan cuidados especiales? Productos y servicios Libro: Social research officer, government to a Healthy Pregnancy (Gua de [facility] para tener un embarazo saludable) Mostrar ms productos y servicios de [facility] Circuncisin (masculina) Informacin Mdicos y Conservation officer, nature [facility] no respalda compaas ni productos. Las recaudaciones de los avisos comerciales financian nuestra misin sin fines de Visual merchandiser.  Avisos comerciales y patrocinio PolticaOportunidadesOpciones de Archivist de [facility] Consulta estos xitos de venta y Economy  especiales en libros y boletines informativos de [facility].  NUEVO: The Essential Diabetes Book (El libro esencial sobre la diabetes) - Prensa de [facility] ClinicNUEVO: The Essential Diabetes Book (El libro esencial sobre la diabetes) [employer], Eat Well (Cocina de forma  inteligente, alimntate bien); 2 recetas GRATUITAS - Prensa de [place] [employer], Eat Well (Cocina de forma inteligente, alimntate bien); 2 recetas GRATUITAS NUEVO: [facility] on Hearing and Balance ([facility] sobre audicin y equilibrio) - Education officer, environmental de [facility] ClinicNUEVO: [employer] on Production manager ([facility] sobre audicin y equilibrio) Evaluacin GRATUITA de la dieta de [facility] - Prensa de [facility] ClinicEvaluacin GRATUITA de la dieta de [employer] Health Letter (Publicacin de Salud de [facility]); libro GRATUITO - Carter Kitten de [facility] Health Letter (Publicacin de Descripcin general El pene antes y despus de la circuncisin  El pene antes y despus de la circuncisinOpen pop-up dialog box La circuncisin es la extirpacin [place] de la piel que cubre la punta del pene. El procedimiento es bastante frecuente entre los recin nacidos varones de determinadas partes del mundo, incluso en los [place]. Es posible Printmaker circuncisin despus del perodo neonatal, aunque es un procedimiento ms complejo.  Para algunas familias, la circuncisin es un ritual religioso. El procedimiento tambin puede ser un tema de tradicin familiar, higiene personal o cuidado preventivo de [employer]. Sin embargo, otros consideran que la circuncisin es innecesaria o que es una desfiguracin.  Productos y servicios Libro: Social research officer, government to a Healthy Pregnancy (Gua de [facility] para tener un embarazo saludable) Mostrar ms productos de [employer] Por qu se realiza La circuncisin es un ritual religioso o cultural para muchas familias judas e islmicas, as como para ciertas tribus aborgenes de frica y [place]. La circuncisin tambin puede ser un tema de tradicin familiar, higiene personal o atencin mdica preventiva.  A veces, existe una necesidad mdica de Printmaker circuncisin; por ejemplo, cuando el prepucio est demasiado apretado como para  moverlo hacia atrs (retraerlo) sobre el glande. En otros casos, especialmente en algunas partes de frica, se recomienda la circuncisin a los nios mayores o los hombres a fin de reducir el riesgo de Primary school teacher algunas infecciones de transmisin sexual.  La circuncisin podra tener diversos beneficios para la salud, como los siguientes:  Higiene ms sencilla. La circuncisin hace que sea ms simple lavar el pene. Sin embargo, a los nios no circuncidados se les puede ensear a lavarse regularmente por debajo del prepucio. Menor riesgo de Marine scientist infecciones urinarias. El riesgo de padecer de infecciones urinarias en hombres es bajo; sin embargo, estas infecciones son ms frecuentes en los hombres no circuncidados. Las infecciones graves que se presentan en las primeras etapas de la vida pueden causar problemas renales posteriormente. Menor riesgo de Primary school teacher infecciones de transmisin sexual. Los hombres circuncidados podran tener un menor riesgo de contraer ciertas infecciones de transmisin sexual, como VIH. Igualmente, las prcticas sexuales seguras siguen siendo esenciales. Prevencin de problemas en el pene. Ocasionalmente, puede ser difcil o imposible retraer el prepucio del pene no circuncidado (fimosis). Esto puede provocar inflamacin en el prepucio o en la cabeza del pene. Menor riesgo de Geneticist, molecular de pene. Si bien el cncer de pene es poco frecuente, es menos comn en los hombres circuncidados. Asimismo, el cncer del cuello del tero es menos frecuente en las parejas sexuales femeninas de los hombres circuncidados. Los riesgos de no ser circuncidado, sin embargo, no solo son raros, sino  que tambin se pueden evitar con el cuidado adecuado del pene.  La circuncisin podra no ser una opcin si existen determinados trastornos en la coagulacin de la [place]. Adems, la circuncisin podra no ser adecuada para bebs prematuros que an requieren atencin mdica en la sala de recin nacidos  del hospital o los bebs que nacen con anomalas en el pene.  La circuncisin no afecta la fertilidad y, en general, no se cree que aumente o Googledisminuya el placer sexual para los hombres o sus parejas.  Solicite Optician, dispensinguna Consulta en [facility] Riesgos Las complicaciones ms frecuentes asociadas con la circuncisin son sangrado e infeccin. Tambin es posible que se presenten efectos secundarios relacionados con la anestesia.  En raras ocasiones, la circuncisin puede causar problemas en el prepucio. Por ejemplo:  Es posible que el prepucio se corte demasiado corto o demasiado largo Es posible que el prepucio no cicatrice correctamente La parte restante del prepucio podra volver a unirse al extremo del pene, lo cual requiere una reparacin quirrgica menor Cmo prepararse Antes de la circuncisin, el mdico [employer] Corporationexplicar los riesgos y beneficios del procedimiento. Tanto si ests planeando la circuncisin de tu hijo como la tuya, es probable que tengas que proporcionar el consentimiento por escrito para el procedimiento.  Lo que puedes esperar Durante el procedimiento La circuncisin en recin nacidos suele realizarse en la sala de recin nacidos del hospital, por lo general, dentro de los [address] posteriores al nacimiento.  Para la circuncisin en recin nacidos, tu hijo estar acostado boca [place], y Abbott Laboratoriesle sujetarn los brazos y las piernas. Despus de limpiar el pene y el rea de alrededor, se Tourist information centre managerinyectar anestesia en la base del pene o se aplicar sobre el pene como una crema. Se colocar una pinza especial o un anillo de plstico en el pene, y se retirar el prepucio.  Despus, se recubrir el pene con un ungento, como un antibitico tpico o vaselina, y se lo Research scientist (life sciences)envolver de Engineer, technical salesmanera holgada con gasa. El procedimiento, por lo general, lleva unos 10 minutos.  La circuncisin es similar para los nios ms grandes y [place]. Sin embargo, si el procedimiento se practica ms adelante, podra ser necesario  llevarlo a cabo bajo anestesia general, la recuperacin podra ser ms prolongada y el riesgo de complicaciones podra ser mayor.  Despus del procedimiento Por lo general, la cicatrizacin del pene lleva entre 7 y [address]. Se puede sentir dolor en la punta del pene al principio, y el pene puede enrojecerse, hincharse o presentar moretones. Tambin podras observar una pequea cantidad de lquido amarillento en la punta del pene.  Si el beb recin nacido est molesto mientras desaparece el efecto de la anestesia, sostenlo con cuidado para Cytogeneticistevitar presionar el pene.  Se puede lavar el pene mientras se cicatriza. Para un recin nacido, cambia el vendaje cada vez que le cambies el paal y aplica una pequea cantidad de vaselina en la punta del pene para evitar que se pegue en el paal. Cambia a menudo el paal de tu beb y asegrate de que no est muy ajustado.  Si se ha usado un anillo de Web designerplstico en lugar de vendaje, este se caer solo (por lo general, en una semana). Una vez que el pene se cicatrice, lvalo con agua y jabn durante el bao normal.  Es poco frecuente que aparezcan problemas despus de la circuncisin. Comuncate con el mdico en las siguientes circunstancias:  No orina normalmente dentro de las 12 horas despus de la circuncisin Hay sangrado  persistente Se observa un drenaje con mal olor de la punta del pene El anillo de plstico contina all dos semanas despus de la circuncisin Escrito por Industrial/product designer de [facility] [place] en [facility] Mdicos y departamentos March 22, 2022ImprimirComparte en: Copywriter, advertising referencias Relacionado El pene antes y despus de la circuncisin Liquen escleroso Pene no circuncidado: se necesitan cuidados especiales? Productos y servicios Libro: Social research officer, government to a Healthy Pregnancy (Gua de [facility] para tener un embarazo saludable) Mostrar ms productos y servicios de [facility] Circuncisin  (masculina) Informacin Mdicos y Conservation officer, nature [facility] no respalda compaas ni productos. Las recaudaciones de los avisos comerciales financian nuestra misin sin fines de Visual merchandiser.  Avisos comerciales y patrocinio PolticaOportunidadesOpciones de Archivist de [facility] Consulta estos xitos de venta y ofertas especiales en libros y boletines informativos de [employer].  NUEVO: The Essential Diabetes Book (El libro esencial sobre la diabetes) - Prensa de [facility] ClinicNUEVO: The Essential Diabetes Book (El libro esencial sobre la diabetes) [employer], Eat Well (Cocina de forma inteligente, alimntate bien); 2 recetas GRATUITAS - Prensa de [place] [employer], Eat Well (Cocina de forma inteligente, alimntate bien); 2 recetas GRATUITAS NUEVO: [facility] on Hearing and Balance ([facility] sobre audicin y equilibrio) - Education officer, environmental de Mayo ClinicNUEVO: [employer] on Production manager ([facility] sobre audicin y equilibrio) Evaluacin GRATUITA de la dieta de [facility] - Prensa de Mayo ClinicEvaluacin GRATUITA de la dieta de [employer] Health Letter (Publicacin de Salud de [facility]); libro GRATUITO - Carter Kitten de [facility] Health Letter (Publicacin de

## 2020-11-10 NOTE — Progress Notes (Signed)
   PRENATAL VISIT NOTE  Subjective:  Alexis Potts is a 35 y.o. [phone] at [redacted]w[redacted]d being seen today for ongoing prenatal care.  She is currently monitored for the following issues for this high-risk pregnancy and has Language barrier; Supervision of high risk pregnancy, antepartum; and Chronic hypertension on their problem list.  Patient reports no complaints.  Contractions: Not present. Vag. Bleeding: None.  Movement: Present. Denies leaking of fluid.   The following portions of the patient's history were reviewed and updated as appropriate: allergies, current medications, past family history, past medical history, past social history, past surgical history and problem list.   Objective:   Vitals:   11/10/20 0842  BP: 115/79  Pulse: 87    Fetal Status: Fetal Heart Rate (bpm): 151   Movement: Present     General:  Alert, oriented and cooperative. Patient is in no acute distress.  Skin: Skin is warm and dry. No rash noted.   Cardiovascular: Normal heart rate noted  Respiratory: Normal respiratory effort, no problems with respiration noted  Abdomen: Soft, gravid, appropriate for gestational age.  Pain/Pressure: Absent     Pelvic: Cervical exam deferred        Extremities: Normal range of motion.  Edema: None  Mental Status: Normal mood and affect. Normal behavior. Normal judgment and thought content.   Assessment and Plan:  Pregnancy: [id number] at [redacted]w[redacted]d 1. Supervision of high risk pregnancy, antepartum -doing well; discussed genetic testing results reviewed -reviewed [place] plans for next visit -continue taking labetalol and aspirin -declined AFP  Preterm labor symptoms and general obstetric precautions including but not limited to vaginal bleeding, contractions, leaking of fluid and fetal movement were reviewed in detail with the patient. Please refer to After Visit Summary for other counseling recommendations.   Return in about 4 weeks (around 12/08/2020), or HROb with  KK.  Future Appointments  Date Time Provider Department Center  11/24/2020  1:15 PM [facility] NURSE [facility]  11/24/2020  1:30 PM [facility] US3 [id number]  12/12/2020  9:15 AM Warden Fillers, MD [facility]    Marylene Land, CNM

## 2020-11-10 NOTE — Addendum Note (Signed)
Addended by: Guy Begin on: 11/10/2020 11:36 AM   Modules accepted: Orders

## 2020-11-21 ENCOUNTER — Telehealth: Payer: Self-pay | Admitting: *Deleted

## 2020-11-21 NOTE — Telephone Encounter (Signed)
Tomasa left a voice message on 11/19/20 am stating she needs blood pressure medicine and PNV RX.  Lanita Stammen,RN

## 2020-11-21 NOTE — Telephone Encounter (Signed)
Patient called this am and left a second message to call her. Jacori Mulrooney,RN

## 2020-11-24 ENCOUNTER — Ambulatory Visit: Payer: Self-pay | Attending: Student

## 2020-11-24 ENCOUNTER — Other Ambulatory Visit: Payer: Self-pay | Admitting: Family Medicine

## 2020-11-24 ENCOUNTER — Other Ambulatory Visit: Payer: Self-pay

## 2020-11-24 ENCOUNTER — Other Ambulatory Visit: Payer: Self-pay | Admitting: *Deleted

## 2020-11-24 ENCOUNTER — Encounter: Payer: Self-pay | Admitting: *Deleted

## 2020-11-24 ENCOUNTER — Ambulatory Visit: Payer: Self-pay | Admitting: *Deleted

## 2020-11-24 VITALS — BP 114/66 | HR 84

## 2020-11-24 DIAGNOSIS — O10919 Unspecified pre-existing hypertension complicating pregnancy, unspecified trimester: Secondary | ICD-10-CM

## 2020-11-24 DIAGNOSIS — O0990 Supervision of high risk pregnancy, unspecified, unspecified trimester: Secondary | ICD-10-CM | POA: Insufficient documentation

## 2020-11-24 DIAGNOSIS — I10 Essential (primary) hypertension: Secondary | ICD-10-CM

## 2020-11-25 ENCOUNTER — Other Ambulatory Visit: Payer: Self-pay

## 2020-11-25 DIAGNOSIS — I10 Essential (primary) hypertension: Secondary | ICD-10-CM

## 2020-11-25 MED ORDER — PREPLUS 27-1 MG PO TABS
1.0000 | ORAL_TABLET | Freq: Every day | ORAL | 6 refills | Status: DC
Start: 2020-11-25 — End: 2020-12-12

## 2020-11-25 MED ORDER — LABETALOL HCL 100 MG PO TABS: 100.0000 mg | ORAL_TABLET | Freq: Two times a day (BID) | ORAL | 0 refills | Status: DC

## 2020-11-25 NOTE — Progress Notes (Signed)
Patient called and requested refill of labetalol and prenatal vitamin. Refill sent.

## 2020-11-25 NOTE — Telephone Encounter (Signed)
Concern addressed in separate encounter

## 2020-11-26 ENCOUNTER — Other Ambulatory Visit: Payer: Self-pay

## 2020-12-12 ENCOUNTER — Ambulatory Visit (INDEPENDENT_AMBULATORY_CARE_PROVIDER_SITE_OTHER): Payer: Self-pay | Admitting: Obstetrics and Gynecology

## 2020-12-12 ENCOUNTER — Other Ambulatory Visit: Payer: Self-pay

## 2020-12-12 VITALS — BP 105/73 | HR 81 | Wt 153.6 lb

## 2020-12-12 DIAGNOSIS — Z23 Encounter for immunization: Secondary | ICD-10-CM

## 2020-12-12 DIAGNOSIS — I10 Essential (primary) hypertension: Secondary | ICD-10-CM

## 2020-12-12 DIAGNOSIS — Z3A22 22 weeks gestation of pregnancy: Secondary | ICD-10-CM

## 2020-12-12 DIAGNOSIS — Z789 Other specified health status: Secondary | ICD-10-CM

## 2020-12-12 DIAGNOSIS — O0990 Supervision of high risk pregnancy, unspecified, unspecified trimester: Secondary | ICD-10-CM

## 2020-12-12 MED ORDER — PREPLUS 27-1 MG PO TABS: 1.0000 | ORAL_TABLET | Freq: Every day | ORAL | 6 refills | Status: DC

## 2020-12-12 MED ORDER — LABETALOL HCL 100 MG PO TABS: 100.0000 mg | ORAL_TABLET | Freq: Two times a day (BID) | ORAL | 5 refills | Status: DC

## 2020-12-12 NOTE — Progress Notes (Signed)
   PRENATAL VISIT NOTE  Subjective:  Alexis Potts is a 35 y.o. [phone] at [redacted]w[redacted]d being seen today for ongoing prenatal care.  She is currently monitored for the following issues for this high-risk pregnancy and has Language barrier; Supervision of high risk pregnancy, antepartum; Chronic hypertension; and [redacted] weeks gestation of pregnancy on their problem list.  Patient doing well with no acute concerns today. She reports no complaints.  Contractions: Not present. Vag. Bleeding: None.  Movement: Present. Denies leaking of fluid.   The following portions of the patient's history were reviewed and updated as appropriate: allergies, current medications, past family history, past medical history, past social history, past surgical history and problem list. Problem list updated.  Objective:   Vitals:   12/12/20 0930  BP: 105/73  Pulse: 81  Weight: 153 lb 9.6 oz (69.7 kg)    Fetal Status: Fetal Heart Rate (bpm): 146   Movement: Present     General:  Alert, oriented and cooperative. Patient is in no acute distress.  Skin: Skin is warm and dry. No rash noted.   Cardiovascular: Normal heart rate noted  Respiratory: Normal respiratory effort, no problems with respiration noted  Abdomen: Soft, gravid, appropriate for gestational age.  Pain/Pressure: Present     Pelvic: Cervical exam deferred        Extremities: Normal range of motion.  Edema: Trace  Mental Status:  Normal mood and affect. Normal behavior. Normal judgment and thought content.   Assessment and Plan:  Pregnancy: [id number] at [redacted]w[redacted]d  1. Essential hypertension Pt has not had her medication for 1 week, but BP is normal.  Pt advised to hold  labetalol unless BP is greater than 135/85  - labetalol (NORMODYNE) 100 MG tablet; Take 1 tablet (100 mg total) by mouth 2 (two) times daily.  Dispense: 60 tablet; Refill: 5  - 3. Need for immunization against influenza  - Flu Vaccine QUAD 1mo+IM (Fluarix, Fluzone & Alfiuria Quad  PF)  4. Supervision of high risk pregnancy, antepartum  Prenatal Vit-Fe Fumarate-FA (PREPLUS) 27-1 MG TABS; Take 1 tablet by mouth daily.  Dispense: 30 tablet; Refill: 6  Continue routine care  5. [redacted] weeks gestation of pregnancy   6. Chronic hypertension Monitor BP closely, growth on 12/24/20  7. Language barrier [phone] interpreter  Preterm labor symptoms and general obstetric precautions including but not limited to vaginal bleeding, contractions, leaking of fluid and fetal movement were reviewed in detail with the patient.  Please refer to After Visit Summary for other counseling recommendations.   Return for [facility], in person, 2 hr GTT, 3rd trim labs.   Mariel Aloe, MD Faculty Attending [facility]

## 2020-12-12 NOTE — Patient Instructions (Signed)
No tomas medicina si su pressura es menos de 135/85

## 2020-12-29 ENCOUNTER — Encounter: Payer: Self-pay | Admitting: *Deleted

## 2020-12-29 ENCOUNTER — Other Ambulatory Visit: Payer: Self-pay

## 2020-12-29 ENCOUNTER — Ambulatory Visit: Payer: Self-pay | Attending: Obstetrics and Gynecology

## 2020-12-29 ENCOUNTER — Ambulatory Visit: Payer: Self-pay | Admitting: *Deleted

## 2020-12-29 ENCOUNTER — Other Ambulatory Visit: Payer: Self-pay | Admitting: *Deleted

## 2020-12-29 VITALS — BP 110/64 | HR 75

## 2020-12-29 DIAGNOSIS — O10919 Unspecified pre-existing hypertension complicating pregnancy, unspecified trimester: Secondary | ICD-10-CM | POA: Insufficient documentation

## 2020-12-29 DIAGNOSIS — O0990 Supervision of high risk pregnancy, unspecified, unspecified trimester: Secondary | ICD-10-CM

## 2020-12-29 DIAGNOSIS — Z362 Encounter for other antenatal screening follow-up: Secondary | ICD-10-CM

## 2020-12-29 DIAGNOSIS — O10012 Pre-existing essential hypertension complicating pregnancy, second trimester: Secondary | ICD-10-CM

## 2020-12-29 DIAGNOSIS — Z3A24 24 weeks gestation of pregnancy: Secondary | ICD-10-CM

## 2020-12-29 DIAGNOSIS — O10912 Unspecified pre-existing hypertension complicating pregnancy, second trimester: Secondary | ICD-10-CM

## 2021-01-09 ENCOUNTER — Other Ambulatory Visit: Payer: Self-pay

## 2021-01-09 DIAGNOSIS — O0990 Supervision of high risk pregnancy, unspecified, unspecified trimester: Secondary | ICD-10-CM

## 2021-01-12 ENCOUNTER — Other Ambulatory Visit: Payer: Self-pay

## 2021-01-12 ENCOUNTER — Ambulatory Visit (INDEPENDENT_AMBULATORY_CARE_PROVIDER_SITE_OTHER): Payer: Self-pay | Admitting: Obstetrics and Gynecology

## 2021-01-12 VITALS — BP 117/83 | HR 85 | Wt 157.3 lb

## 2021-01-12 DIAGNOSIS — I10 Essential (primary) hypertension: Secondary | ICD-10-CM

## 2021-01-12 DIAGNOSIS — O0990 Supervision of high risk pregnancy, unspecified, unspecified trimester: Secondary | ICD-10-CM

## 2021-01-12 DIAGNOSIS — Z758 Other problems related to medical facilities and other health care: Secondary | ICD-10-CM

## 2021-01-12 DIAGNOSIS — Z3A26 26 weeks gestation of pregnancy: Secondary | ICD-10-CM

## 2021-01-12 DIAGNOSIS — Z789 Other specified health status: Secondary | ICD-10-CM

## 2021-01-12 DIAGNOSIS — Z23 Encounter for immunization: Secondary | ICD-10-CM

## 2021-01-12 MED ORDER — LABETALOL HCL 100 MG PO TABS: 100.0000 mg | ORAL_TABLET | Freq: Two times a day (BID) | ORAL | 1 refills | Status: DC

## 2021-01-12 NOTE — Addendum Note (Signed)
Addended by: [place] Bing on: 01/12/2021 10:53 AM   Modules accepted: Orders

## 2021-01-12 NOTE — Progress Notes (Addendum)
    PRENATAL VISIT NOTE  Subjective:  Alexis Potts is a 35 y.o. [phone] at [redacted]w[redacted]d being seen today for ongoing prenatal care.  She is currently monitored for the following issues for this high-risk pregnancy and has Language barrier; Supervision of high risk pregnancy, antepartum; and Chronic hypertension on their problem list.  Patient reports no complaints.  Contractions: Not present. Vag. Bleeding: None.  Movement: Present. Denies leaking of fluid.   The following portions of the patient's history were reviewed and updated as appropriate: allergies, current medications, past family history, past medical history, past social history, past surgical history and problem list.   Objective:   Vitals:   01/12/21 1027  BP: 117/83  Pulse: 85  Weight: 157 lb 4.8 oz (71.4 kg)    Fetal Status: Fetal Heart Rate (bpm): 149   Movement: Present     General:  Alert, oriented and cooperative. Patient is in no acute distress.  Skin: Skin is warm and dry. No rash noted.   Cardiovascular: Normal heart rate noted  Respiratory: Normal respiratory effort, no problems with respiration noted  Abdomen: Soft, gravid, appropriate for gestational age.  Pain/Pressure: Absent     Pelvic: Cervical exam deferred        Extremities: Normal range of motion.  Edema: None  Mental Status: Normal mood and affect. Normal behavior. Normal judgment and thought content.   Assessment and Plan:  Pregnancy: [id number] at [redacted]w[redacted]d 1. Supervision of high risk pregnancy, antepartum Routine care. Continue low dose asa. Tdap today - Tdap vaccine greater than or equal to 7yo IM - CBC - HIV antibody (with reflex) - RPR  2. Chronic hypertension Doing well on labetalol 100 bid. Pt states there was a week in august where she was off of it due to difficulty obtaining refills but she has been on it since and confirms it's 100 bid. Continue on this.  Has rpt u/s on 10/10. 9/12 94%, 899gm, ac 89%, normal AF  3. Language  barrier Interpreter used  4. [redacted] weeks gestation of pregnancy  Preterm labor symptoms and general obstetric precautions including but not limited to vaginal bleeding, contractions, leaking of fluid and fetal movement were reviewed in detail with the patient. Please refer to After Visit Summary for other counseling recommendations.   Return in 2 weeks (on 01/26/2021) for in person, md visit, high risk ob.  Future Appointments  Date Time Provider Department Center  01/26/2021  2:45 PM [facility] NURSE [facility]  01/26/2021  3:00 PM [facility] US1 [facility]    [place] Bing, MD

## 2021-01-12 NOTE — Addendum Note (Signed)
Addended by: [place] Bing on: 01/12/2021 10:46 AM   Modules accepted: Orders

## 2021-01-13 LAB — GLUCOSE TOLERANCE, 2 HOURS W/ 1HR
Glucose, 1 hour: 147 mg/dL (ref 65–179)
Glucose, 2 hour: 106 mg/dL (ref 65–152)
Glucose, Fasting: 85 mg/dL (ref 65–91)

## 2021-01-13 LAB — CBC
Hematocrit: 36.4 % (ref 34.0–46.6)
Hemoglobin: 12.5 g/dL (ref 11.1–15.9)
MCH: 31.6 pg (ref 26.6–33.0)
MCHC: 34.3 g/dL (ref 31.5–35.7)
MCV: 92 fL (ref 79–97)
Platelets: 235 10*3/uL (ref 150–450)
RBC: 3.96 x10E6/uL (ref 3.77–5.28)
RDW: 13.1 % (ref 11.7–15.4)
WBC: 5.9 10*3/uL (ref 3.4–10.8)

## 2021-01-13 LAB — RPR: RPR Ser Ql: NONREACTIVE

## 2021-01-13 LAB — HIV ANTIBODY (ROUTINE TESTING W REFLEX): HIV Screen 4th Generation wRfx: NONREACTIVE

## 2021-01-26 ENCOUNTER — Ambulatory Visit (INDEPENDENT_AMBULATORY_CARE_PROVIDER_SITE_OTHER): Payer: Self-pay | Admitting: Obstetrics and Gynecology

## 2021-01-26 ENCOUNTER — Ambulatory Visit: Payer: Self-pay | Admitting: *Deleted

## 2021-01-26 ENCOUNTER — Ambulatory Visit: Payer: Self-pay | Attending: Maternal & Fetal Medicine

## 2021-01-26 ENCOUNTER — Other Ambulatory Visit: Payer: Self-pay

## 2021-01-26 ENCOUNTER — Other Ambulatory Visit: Payer: Self-pay | Admitting: *Deleted

## 2021-01-26 VITALS — BP 125/87 | HR 79 | Wt 156.0 lb

## 2021-01-26 VITALS — BP 119/78 | HR 74

## 2021-01-26 DIAGNOSIS — O0990 Supervision of high risk pregnancy, unspecified, unspecified trimester: Secondary | ICD-10-CM

## 2021-01-26 DIAGNOSIS — O10013 Pre-existing essential hypertension complicating pregnancy, third trimester: Secondary | ICD-10-CM

## 2021-01-26 DIAGNOSIS — O10912 Unspecified pre-existing hypertension complicating pregnancy, second trimester: Secondary | ICD-10-CM | POA: Insufficient documentation

## 2021-01-26 DIAGNOSIS — I10 Essential (primary) hypertension: Secondary | ICD-10-CM

## 2021-01-26 DIAGNOSIS — Z362 Encounter for other antenatal screening follow-up: Secondary | ICD-10-CM

## 2021-01-26 DIAGNOSIS — Z789 Other specified health status: Secondary | ICD-10-CM

## 2021-01-26 DIAGNOSIS — Z3A28 28 weeks gestation of pregnancy: Secondary | ICD-10-CM

## 2021-01-26 DIAGNOSIS — O10919 Unspecified pre-existing hypertension complicating pregnancy, unspecified trimester: Secondary | ICD-10-CM

## 2021-01-26 NOTE — Progress Notes (Signed)
   PRENATAL VISIT NOTE  Subjective:  Alexis Potts is a 35 y.o. [phone] at [redacted]w[redacted]d being seen today for ongoing prenatal care.  She is currently monitored for the following issues for this high-risk pregnancy and has Language barrier; Supervision of high risk pregnancy, antepartum; Chronic hypertension; and [redacted] weeks gestation of pregnancy on their problem list.  Patient doing well with no acute concerns today. She reports no complaints.  Contractions: Not present. Vag. Bleeding: None.  Movement: Present. Denies leaking of fluid.   The following portions of the patient's history were reviewed and updated as appropriate: allergies, current medications, past family history, past medical history, past social history, past surgical history and problem list. Problem list updated.  Objective:   Vitals:   01/26/21 1633  BP: 125/87  Pulse: 79  Weight: 156 lb (70.8 kg)    Fetal Status: Fetal Heart Rate (bpm): 153 Fundal Height: 29 cm Movement: Present     General:  Alert, oriented and cooperative. Patient is in no acute distress.  Skin: Skin is warm and dry. No rash noted.   Cardiovascular: Normal heart rate noted  Respiratory: Normal respiratory effort, no problems with respiration noted  Abdomen: Soft, gravid, appropriate for gestational age.  Pain/Pressure: Absent     Pelvic: Cervical exam deferred        Extremities: Normal range of motion.  Edema: None  Mental Status:  Normal mood and affect. Normal behavior. Normal judgment and thought content.   Assessment and Plan:  Pregnancy: [id number] at [redacted]w[redacted]d  1. [redacted] weeks gestation of pregnancy   2. Chronic hypertension BP well controlled on labetalol Pt continues baby ASA  3. Supervision of high risk pregnancy, antepartum Continue routine care  4. Language barrier Live interpreter present  Preterm labor symptoms and general obstetric precautions including but not limited to vaginal bleeding, contractions, leaking of fluid and  fetal movement were reviewed in detail with the patient.  Please refer to After Visit Summary for other counseling recommendations.   Return in about 2 weeks (around 02/09/2021) for [facility], in person.   Mariel Aloe, MD Faculty Attending Center for [facility]

## 2021-02-10 ENCOUNTER — Encounter: Payer: Self-pay | Admitting: Obstetrics & Gynecology

## 2021-02-10 ENCOUNTER — Other Ambulatory Visit: Payer: Self-pay

## 2021-02-10 ENCOUNTER — Ambulatory Visit (INDEPENDENT_AMBULATORY_CARE_PROVIDER_SITE_OTHER): Payer: Self-pay | Admitting: Obstetrics & Gynecology

## 2021-02-10 VITALS — BP 120/81 | HR 78 | Wt 160.5 lb

## 2021-02-10 DIAGNOSIS — O0990 Supervision of high risk pregnancy, unspecified, unspecified trimester: Secondary | ICD-10-CM

## 2021-02-10 DIAGNOSIS — O10013 Pre-existing essential hypertension complicating pregnancy, third trimester: Secondary | ICD-10-CM

## 2021-02-10 DIAGNOSIS — Z3A30 30 weeks gestation of pregnancy: Secondary | ICD-10-CM

## 2021-02-10 MED ORDER — LABETALOL HCL 100 MG PO TABS: 100.0000 mg | ORAL_TABLET | Freq: Two times a day (BID) | ORAL | 2 refills | Status: DC

## 2021-02-10 NOTE — Patient Instructions (Signed)
Prevencin del parto prematuro Preventing Preterm Birth Se conoce como parto prematuro al nacimiento del beb entre las semanas 20 y 37 del Psychiatrist. Un embarazo a trmino comprende un mnimo de 37 semanas. El parto prematuro puede aumentar el riesgo de complicaciones para el beb porque no ha madurado completamente antes de Psychologist, clinical. Cmo puede afectar al beb el parto prematuro? Las complicaciones del parto prematuro pueden incluir las siguientes: Problemas respiratorios. Problemas de visin o audicin. Dificultad para alimentarse. Infecciones o inflamacin del tubo digestivo (colitis). [employer] al nacer o muy bajo peso al nacer. Dao cerebral que causa retrasos en el desarrollo y discapacidades de aprendizaje, y que afecta al movimiento y la coordinacin (parlisis cerebral). Mayor riesgo de padecer diabetes, enfermedades cardacas y presin arterial alta en el futuro. Qu puede aumentar mi riesgo de tener un parto prematuro? Antecedentes mdicos No se conoce la causa exacta de los partos prematuros. Los siguientes factores pueden hacerla ms propensa a tener un parto prematuro: Recibir un diagnstico de placenta previa. Esta es una afeccin en la que la placenta cubre la parte inferior del tero (cuello uterino), que se abre hacia la vagina. Ciertas afecciones del Psychiatrist actual y de Proofreader, como: Haber tenido un parto prematuro antes. Estar embarazada de ms de un beb. Tener embarazos seguidos con menos de 18 meses de diferencia. Ciertas anormalidades en el beb en gestacin. Hemorragia vaginal durante el embarazo. Quedar embarazada a travs de fertilizacin in vitro (FIV). Tener sobrepeso o [place]. Antecedentes mdicos de lo siguiente: ITS (infecciones de transmisin sexual) u otras infecciones en las vas urinarias y la vagina. Enfermedades a Air cabin crew (crnicas), como problemas de coagulacin sangunea, diabetes o hipertensin arterial. Cuello uterino  corto. Factores relacionados con el ambiente y el estilo de vida Consumir drogas o productos que contengan tabaco. Consumo de alcohol. Tener estrs y no contar con apoyo social. Violencia en el hogar (violencia domstica). Estar expuesta a ciertas sustancias qumicas o contaminantes del ambiente. Qu medidas puedo tomar para evitar un parto prematuro? Atencin mdica Lo ms importante que puede hacer para disminuir el riesgo de Warehouse manager un parto prematuro es Barista atencin mdica de rutina durante el Psychiatrist (cuidado prenatal). Concurra a todas las visitas de seguimiento. Esto es importante. Si corre un riesgo alto de Warehouse manager un parto prematuro: La podran derivar a un mdico que se especialice en el control de embarazos de Conservator, museum/gallery (perinatlogo). Tal vez le receten medicamentos para ayudar a Ship broker. Estilo de vida Ciertos cambios en el estilo de vida tambin pueden disminuir el riesgo de tener un parto prematuro: Espere al menos 6 meses despus de un embarazo para volver a Scientist, research (physical sciences). Antes de Kerry Kass, logre un peso saludable. Si tiene sobrepeso, trabaje con el mdico para adelgazar sin riegos. No consuma ningn producto que contenga nicotina o tabaco. Estos productos incluyen cigarrillos, tabaco para Theatre manager y aparatos de vapeo, como los Administrator, Civil Service. Si necesita ayuda para dejar de fumar, consulte al mdico. No beba alcohol. No consuma drogas. Siga una dieta saludable. Controle otros problemas mdicos que tenga, como por ejemplo, diabetes o presin arterial alta. Dnde buscar apoyo Para obtener ms ayuda, considere lo siguiente: Hablar con el mdico. Hablar con un terapeuta o un asesor de consumo de drogas si necesita ayuda para dejar de hacerlo. Trabajar con un nutricionista o un entrenador fsico para Technical brewer peso saludable. Unirse a un grupo de apoyo. Dnde buscar ms informacin Obtenga ms informacin sobre cmo prevenir un  Psychiatrist  prematuro en los siguientes sitios: Centers for Disease Control and Prevention (Centros para el Control y la Prevencin de Enfermedades): cdc.gov March of Dimes: marchofdimes.org American Pregnancy Association (Asociacin Estadounidense del Embarazo): americanpregnancy.org Comunquese con un mdico si: Tiene cualquiera de estos sntomas de trabajo de parto prematuro antes de las 37 semanas: Cambio o aumento de la secrecin vaginal. Prdida de lquido por la vagina. Presin o calambres en la parte inferior del abdomen. Dolor de espalda que no se calma o empeora. Endurecimiento regular (contracciones) en la parte inferior del abdomen. Solicite ayuda de inmediato si: Tiene contracciones dolorosas y regulares cada 5 minutos o menos. Rompe la bolsa. Resumen Parto prematuro significa tener al beb durante las semanas 20 a 37 del embarazo. El parto prematuro puede poner al beb en riesgo de complicaciones de salud. No se conoce la causa exacta de los partos prematuros. Obtener un buen cuidado prenatal de rutina puede ayudar a prevenir el parto prematuro. Concurra a todas las visitas de seguimiento. Esto es importante. Comunquese con un mdico si tiene sntomas de trabajo de parto prematuro. Esta informacin no tiene como fin reemplazar el consejo del mdico. Asegrese de hacerle al mdico cualquier pregunta que tenga. Document Revised: 04/25/2020 Document Reviewed: 04/25/2020 Elsevier Patient Education  2022 Elsevier Inc.  

## 2021-02-10 NOTE — Progress Notes (Signed)
PRENATAL VISIT NOTE  Subjective:  Alexis Potts is a 35 y.o. [phone] at [redacted]w[redacted]d being seen today for ongoing prenatal care.  Patient is Spanish-speaking only, interpreter present for this encounter. She is currently monitored for the following issues for this high-risk pregnancy and has Language barrier; Supervision of high risk pregnancy, antepartum; and Pre-existing essential hypertension during pregnancy in third trimester on their problem list.  Patient reports no complaints.  Contractions: Not present. Vag. Bleeding: None.  Movement: Present. Denies leaking of fluid.   The following portions of the patient's history were reviewed and updated as appropriate: allergies, current medications, past family history, past medical history, past social history, past surgical history and problem list.   Objective:   Vitals:   02/10/21 1536  BP: 120/81  Pulse: 78  Weight: 160 lb 8 oz (72.8 kg)    Fetal Status: Fetal Heart Rate (bpm): 150   Movement: Present     General:  Alert, oriented and cooperative. Patient is in no acute distress.  Skin: Skin is warm and dry. No rash noted.   Cardiovascular: Normal heart rate noted  Respiratory: Normal respiratory effort, no problems with respiration noted  Abdomen: Soft, gravid, appropriate for gestational age.  Pain/Pressure: Present     Pelvic: Cervical exam deferred        Extremities: Normal range of motion.  Edema: None  Mental Status: Normal mood and affect. Normal behavior. Normal judgment and thought content.   Imaging: [place] MFM OB FOLLOW UP  Result Date: 01/26/2021 ----------------------------------------------------------------------  OBSTETRICS REPORT                       (Signed Final 01/26/2021 04:22 pm) ---------------------------------------------------------------------- Patient Info  ID #:       [id number]                          D.O.B.:  [birth year] (35 yrs)  Name:       Alexis Potts                Visit Date:  01/26/2021 03:24 pm              GONZALEZ ---------------------------------------------------------------------- Performed By  Attending:        Noralee Space MD        Ref. Address:     [address]                                                             [place], [place]                                                             [address]  Performed By:     Sandi Mealy        [facility]  Referred By:      [facility] ---------------------------------------------------------------------- Orders  #  Description                           Code        Ordered By  1  [place] MFM OB FOLLOW UP                   [phone]    Lin Landsman ----------------------------------------------------------------------  #  Order #                     Accession #                Episode #  1  [id number]                   [id number]                 [id number] ---------------------------------------------------------------------- Indications  [redacted] weeks gestation of pregnancy                Z3A.28  Hypertension - Chronic/Pre-existing            O10.019  (labetalol)  Encounter for other antenatal screening        Z36.2  follow-up  Low risk NIPS ---------------------------------------------------------------------- Fetal Evaluation  Num Of Fetuses:         1  Fetal Heart Rate(bpm):  153  Cardiac Activity:       Observed  Presentation:           Cephalic  Placenta:               Posterior  P. Cord Insertion:      Visualized  Amniotic Fluid  AFI FV:      Within normal limits  AFI Sum(cm)     %Tile       Largest Pocket(cm)  12.54           33          3.77  RUQ(cm)       RLQ(cm)       LUQ(cm)        LLQ(cm)  3.77           3.7  2.02           3.05 ---------------------------------------------------------------------- Biometry  BPD:      67.4  mm     G. Age:  27w 1d        4.5  %    CI:        68.68   %    70 - 86                                                          FL/HC:      21.5   %    19.6 - 20.8  HC:      259.9  mm     G. Age:  28w 2d         10  %    HC/AC:      1.01        0.99 - 1.21  AC:      257.5  mm     G. Age:  29w 6d         78  %    FL/BPD:     82.9   %    71 - 87  FL:       55.9  mm     G. Age:  29w 3d         56  %    FL/AC:      21.7   %    20 - 24  HUM:        50  mm     G. Age:  29w 2d         56  %  LV:        3.2  mm  Est. FW:    1382  gm      3 lb 1 oz     62  % ---------------------------------------------------------------------- OB History  Gravidity:    4         Term:   2        Prem:   0        SAB:   1  TOP:          0       Ectopic:  0        Living: 2 ---------------------------------------------------------------------- Gestational Age  LMP:           28w 5d        Date:  07/09/20                 EDD:   04/15/21  U/S Today:     28w 5d                                        EDD:   04/15/21  Best:          28w 5d     Det. By:  LMP  (07/09/20)          EDD:   04/15/21 ---------------------------------------------------------------------- Anatomy  Cranium:               Appears normal         LVOT:  Previously seen  Cavum:                 Previously seen        Aortic Arch:            Previously seen  Ventricles:            Appears normal         Ductal Arch:            Previously seen  Choroid Plexus:        Previously seen        Diaphragm:              Appears normal  Cerebellum:            Previously seen        Stomach:                Appears normal, left                                                                        sided  Posterior Fossa:       Previously seen        Abdomen:                Appears normal  Nuchal Fold:           Not applicable (>20    Abdominal  Wall:         Previously seen                         wks GA)  Face:                  Orbits and profile     Cord Vessels:           Previously seen                         previously seen  Lips:                  Previously seen        Kidneys:                Appear normal  Palate:                Previously seen        Bladder:                Appears normal  Thoracic:              Appears normal         Spine:                  Previously seen  Heart:                 Previously seen        Upper Extremities:      Previously seen  RVOT:                  Previously seen        Lower Extremities:      Previously seen  Other:  VC, 3VV and 3VTV visualized. Technically difficult due to fetal          position. Fetus appears to be a female. Nasal bone visualized. ---------------------------------------------------------------------- Cervix Uterus Adnexa  Cervix  Length:           3.53  cm. ---------------------------------------------------------------------- Impression  Chronic hypertension.  Well-controlled on labetalol.  Blood  pressure today at her office is 119/78 mmHg.  She does not have gestational diabetes.  Amniotic fluid is normal and good fetal activity is seen .Fetal  growth is appropriate for gestational age .  We reassured the patient of the findings with help of Spanish  language interpreter present in the room. ---------------------------------------------------------------------- Recommendations  -An appointment was made for her to return in 4 weeks for  fetal growth assessment.  -Weekly BPP from [redacted] weeks gestation till delivery. ----------------------------------------------------------------------                  Noralee Space, MD Electronically Signed Final Report   01/26/2021 04:22 pm ----------------------------------------------------------------------   Assessment and Plan:  Pregnancy: [id number] at [redacted]w[redacted]d 1. Pre-existing essential hypertension during pregnancy in third trimester 2. [redacted] weeks gestation  of pregnancy 3. Supervision of high risk pregnancy, antepartum Stable BP on Labetalol. Continue antenatal testing and scans as scheduled. Preterm labor symptoms and general obstetric precautions including but not limited to vaginal bleeding, contractions, leaking of fluid and fetal movement were reviewed in detail with the patient. Please refer to After Visit Summary for other counseling recommendations.   Return in about 20 days (around 03/02/2021) for OFFICE OB VISIT (MD only), BPP, NST as scheduled.  Future Appointments  Date Time Provider Department Center  02/16/2021  3:15 PM [facility] NST [facility]  02/23/2021  1:45 PM [facility] NURSE [facility]  02/23/2021  2:00 PM [facility] US1 [facility]  03/02/2021  1:15 PM [facility] NST [facility]  03/02/2021  2:15 PM Hermina Staggers, MD [facility]  03/09/2021  1:15 PM [facility] NST [facility]  03/09/2021  2:15 PM [place] Bing, MD [facility]    Jaynie Collins, MD

## 2021-02-16 ENCOUNTER — Ambulatory Visit (INDEPENDENT_AMBULATORY_CARE_PROVIDER_SITE_OTHER): Payer: Self-pay

## 2021-02-16 ENCOUNTER — Other Ambulatory Visit: Payer: Self-pay

## 2021-02-16 ENCOUNTER — Ambulatory Visit (INDEPENDENT_AMBULATORY_CARE_PROVIDER_SITE_OTHER): Payer: Self-pay | Admitting: General Practice

## 2021-02-16 DIAGNOSIS — Z3A31 31 weeks gestation of pregnancy: Secondary | ICD-10-CM

## 2021-02-16 DIAGNOSIS — O10013 Pre-existing essential hypertension complicating pregnancy, third trimester: Secondary | ICD-10-CM

## 2021-02-16 DIAGNOSIS — O0990 Supervision of high risk pregnancy, unspecified, unspecified trimester: Secondary | ICD-10-CM

## 2021-02-16 NOTE — Progress Notes (Signed)
Pt informed that the ultrasound is considered a limited OB ultrasound and is not intended to be a complete ultrasound exam.  Patient also informed that the ultrasound is not being completed with the intent of assessing for fetal or placental anomalies or any pelvic abnormalities.  Explained that the purpose of today's ultrasound is to assess for  BPP, presentation, and AFI.  Patient acknowledges the purpose of the exam and the limitations of the study.     Bravery Ketcham H RN BSN 02/16/21  

## 2021-02-20 NOTE — Progress Notes (Signed)
Patient was assessed and managed by nursing staff during this encounter. I have reviewed the chart and agree with the documentation and plan. I have also made any necessary editorial changes.  Warden Fillers, MD 02/20/2021 8:30 PM

## 2021-02-23 ENCOUNTER — Other Ambulatory Visit: Payer: Self-pay

## 2021-02-23 ENCOUNTER — Ambulatory Visit: Payer: Self-pay | Admitting: *Deleted

## 2021-02-23 ENCOUNTER — Other Ambulatory Visit: Payer: Self-pay | Admitting: *Deleted

## 2021-02-23 ENCOUNTER — Ambulatory Visit: Payer: Self-pay | Attending: Obstetrics and Gynecology

## 2021-02-23 VITALS — BP 121/80 | HR 80

## 2021-02-23 DIAGNOSIS — O10013 Pre-existing essential hypertension complicating pregnancy, third trimester: Secondary | ICD-10-CM

## 2021-02-23 DIAGNOSIS — Z3A32 32 weeks gestation of pregnancy: Secondary | ICD-10-CM

## 2021-02-23 DIAGNOSIS — Z362 Encounter for other antenatal screening follow-up: Secondary | ICD-10-CM

## 2021-02-23 DIAGNOSIS — O10913 Unspecified pre-existing hypertension complicating pregnancy, third trimester: Secondary | ICD-10-CM

## 2021-02-23 DIAGNOSIS — O0990 Supervision of high risk pregnancy, unspecified, unspecified trimester: Secondary | ICD-10-CM

## 2021-02-23 DIAGNOSIS — O10919 Unspecified pre-existing hypertension complicating pregnancy, unspecified trimester: Secondary | ICD-10-CM | POA: Insufficient documentation

## 2021-02-23 NOTE — Progress Notes (Unsigned)
Us/

## 2021-03-02 ENCOUNTER — Ambulatory Visit (INDEPENDENT_AMBULATORY_CARE_PROVIDER_SITE_OTHER): Payer: Self-pay | Admitting: Obstetrics and Gynecology

## 2021-03-02 ENCOUNTER — Ambulatory Visit: Payer: Self-pay | Admitting: *Deleted

## 2021-03-02 ENCOUNTER — Other Ambulatory Visit: Payer: Self-pay

## 2021-03-02 ENCOUNTER — Encounter: Payer: Self-pay | Admitting: Obstetrics and Gynecology

## 2021-03-02 ENCOUNTER — Ambulatory Visit (INDEPENDENT_AMBULATORY_CARE_PROVIDER_SITE_OTHER): Payer: Self-pay

## 2021-03-02 VITALS — BP 120/82 | HR 90 | Wt 163.0 lb

## 2021-03-02 DIAGNOSIS — O10013 Pre-existing essential hypertension complicating pregnancy, third trimester: Secondary | ICD-10-CM

## 2021-03-02 DIAGNOSIS — O0990 Supervision of high risk pregnancy, unspecified, unspecified trimester: Secondary | ICD-10-CM

## 2021-03-02 DIAGNOSIS — Z789 Other specified health status: Secondary | ICD-10-CM

## 2021-03-02 NOTE — Progress Notes (Signed)
Subjective:  Alexis Potts is a 35 y.o. [phone] at [redacted]w[redacted]d being seen today for ongoing prenatal care.  She is currently monitored for the following issues for this high-risk pregnancy and has Language barrier; Supervision of high risk pregnancy, antepartum; and Pre-existing essential hypertension during pregnancy in third trimester on their problem list.  Patient reports general discomforts of pregnancy.  Contractions: Irregular. Vag. Bleeding: None.  Movement: Present. Denies leaking of fluid.   The following portions of the patient's history were reviewed and updated as appropriate: allergies, current medications, past family history, past medical history, past social history, past surgical history and problem list. Problem list updated.  Objective:   Vitals:   03/02/21 1410  BP: 120/82  Pulse: 90  Weight: 163 lb (73.9 kg)    Fetal Status: Fetal Heart Rate (bpm): RNST   Movement: Present     General:  Alert, oriented and cooperative. Patient is in no acute distress.  Skin: Skin is warm and dry. No rash noted.   Cardiovascular: Normal heart rate noted  Respiratory: Normal respiratory effort, no problems with respiration noted  Abdomen: Soft, gravid, appropriate for gestational age. Pain/Pressure: Absent     Pelvic:  Cervical exam deferred        Extremities: Normal range of motion.     Mental Status: Normal mood and affect. Normal behavior. Normal judgment and thought content.   Urinalysis:      Assessment and Plan:  Pregnancy: [id number] at [redacted]w[redacted]d  1. Supervision of high risk pregnancy, antepartum Stable  2. Pre-existing essential hypertension during pregnancy in third trimester BPP today BP stable Continue with Labetalol  3. Language barrier Live interrupter used during today's visit  Preterm labor symptoms and general obstetric precautions including but not limited to vaginal bleeding, contractions, leaking of fluid and fetal movement were reviewed in detail with  the patient. Please refer to After Visit Summary for other counseling recommendations.  Return in about 1 week (around 03/09/2021) for [facility] & NST/BPP as scheduled.   Hermina Staggers, MD

## 2021-03-02 NOTE — Patient Instructions (Signed)
Tercer trimestre de embarazo Third Trimester of Pregnancy El tercer trimestre de embarazo va desde la semana 28 hasta la semana 40. Esto corresponde a los meses 7 a 9. El tercer trimestre es un perodo en el que el beb en gestacin (feto) crece rpidamente. Hacia el final del noveno mes, el feto mide alrededor de 20pulgadas (45cm) de largo y pesa entre 6 y 10 libras (2.7 y 4.5kg). Cambios en el cuerpo durante el tercer trimestre Durante el tercer trimestre, su cuerpo contina experimentando numerosos cambios. Los cambios varan y generalmente vuelven a la normalidad despus del nacimiento del beb. Cambios fsicos Seguir aumentando de peso. Es de esperar que aumente entre 25 y 35libras (11 y 16kg) hacia el final del embarazo si inicia el embarazo con un peso normal. Si tiene bajo peso, es de esperar que aumente entre 28 y 40 libras (13 y18 kg), y si tiene sobrepeso, es de esperar que aumente entre 15 y 25 libras (7 y 11kg). Podrn aparecer las primeras estras en las caderas, el abdomen y las mamas. Las mamas seguirn creciendo y pueden doler. Un lquido amarillo (calostro) puede salir de sus pechos. Esta es la primera leche que usted produce para su beb. Tal vez haya cambios en el cabello. Esto cambios pueden incluir su engrosamiento, crecimiento rpido y cambios en la textura. A algunas personas tambin se les cae el cabello durante o despus del embarazo, o tienen el cabello seco o fino. El ombligo puede salir hacia afuera. Puede observar que se le hinchan las manos, el rostro o los tobillos. Cambios en la salud Es posible que tenga acidez estomacal. Puede sufrir estreimiento. Puede desarrollar hemorroides. Puede desarrollar venas hinchadas y abultadas (venas varicosas) en las piernas. Puede presentar ms dolor en la pelvis, la espalda o los muslos. Esto se debe al aumento de peso y al aumento de las hormonas que relajan las articulaciones. Puede presentar un aumento del hormigueo o  entumecimiento en las manos, brazos y piernas. La piel de su abdomen tambin puede sentirse entumecida. Puede sentir que le falta el aire debido a que se expande el tero. Otros cambios Puede tener necesidad de orinar con ms frecuencia porque el feto baja hacia la pelvis y ejerce presin sobre la vejiga. Puede tener ms problemas para dormir. Esto puede deberse al tamao de su abdomen, una mayor necesidad de orinar y un aumento en el metabolismo de su cuerpo. Puede notar que el feto "baja" o lo siente ms bajo, en el abdomen (aligeramiento). Puede tener un aumento de la secrecin vaginal. Puede notar que tiene dolor alrededor del hueso plvico a medida que el tero se distiende. Siga estas instrucciones en su casa: Medicamentos Siga las instrucciones del mdico en relacin con el uso de medicamentos. Durante el embarazo, hay medicamentos que pueden tomarse y otros que no. No tome ningn medicamento a menos que lo haya autorizado el mdico. Tome vitaminas prenatales que contengan por lo menos 600microgramos (mcg) de cido flico. Comida y bebida Lleve una dieta saludable que incluya frutas y verduras frescas, cereales integrales, buenas fuentes de protenas como carnes magras, huevos o tofu, y productos lcteos descremados. Evite la carne cruda y el jugo, la leche y el queso sin pasteurizar. Estos portan grmenes que pueden provocar dao tanto a usted como al beb. Tome 4 o 5 comidas pequeas en lugar de 3 comidas abundantes al da. Es posible que tenga que tomar estas medidas para prevenir o tratar el estreimiento: Beber suficiente lquido como para mantener la orina   de color amarillo plido. Consumir alimentos ricos en fibra, como frijoles, cereales integrales, y frutas y verduras frescas. Limitar el consumo de alimentos ricos en grasa y azcares procesados, como los alimentos fritos o dulces. Actividad Haga ejercicio solamente como se lo haya indicado el mdico. La mayora de las personas  pueden continuar su actividad fsica habitual durante el embarazo. Intente realizar como mnimo 30minutos de actividad fsica por lo menos 5das a la semana. Deje de hacer ejercicio si experimenta contracciones en el tero. Deje de hacer ejercicio si le aparecen dolor o clicos en la parte baja del vientre o de la espalda. Evite levantar pesos excesivos. No haga ejercicio si hace mucho calor o humedad, o si se encuentra a una altitud elevada. Si lo desea, puede seguir teniendo relaciones sexuales, salvo que el mdico le indique lo contrario. Alivio del dolor y del malestar Haga pausas frecuentes y descanse con las piernas levantadas (elevadas) si tiene calambres en las piernas o dolor en la parte baja de la espalda. Dese baos de asiento con agua tibia para aliviar el dolor o las molestias causadas por las hemorroides. Use una crema para las hemorroides si el mdico la autoriza. Use un sujetador que le brinde buen soporte para prevenir las molestias causadas por la sensibilidad en las mamas. Si tiene venas varicosas: Use medias de compresin como se lo haya indicado el mdico. Eleve los pies durante 15minutos, 3 o 4veces por da. Limite el consumo de sal en su dieta. Seguridad Hable con su mdico antes de viajar distancias largas. No se d baos de inmersin en agua caliente, baos turcos ni saunas. Use el cinturn de seguridad en todo momento mientras conduce o va en auto. Hable con el mdico si es vctima de maltrato verbal o fsico. Preparacin para el nacimiento Para prepararse para la llegada de su beb: Tome clases prenatales para entender, practicar, y hacer preguntas sobre el trabajo de parto y el parto. Visite el hospital y recorra el rea de maternidad. Compre un asiento de seguridad orientado hacia atrs, y asegrese de saber cmo instalarlo en su automvil. Prepare la habitacin o el lugar donde dormir el beb. Asegrese de quitar todas las almohadas y animales de peluche de  la cuna del beb para evitar la asfixia. Indicaciones generales Evite el contacto con las bandejas sanitarias de los gatos y la tierra que estos animales usan. Estos alimentos contienen bacterias que pueden causar defectos congnitos en el beb. Si tiene un gato, pdale a alguien que limpie la caja de arena por usted. No se haga lavados vaginales ni use tampones. No use toallas higinicas perfumadas. No consuma ningn producto que contenga nicotina o tabaco, como cigarrillos, cigarrillos electrnicos y tabaco de mascar. Si necesita ayuda para dejar de consumir estos productos, consulte al mdico. No use ningn remedio a base de hierbas, drogas ilegales o medicamentos que no le hayan sido recetados. Las sustancias qumicas de estos productos pueden daar al beb. No beba alcohol. Le realizarn exmenes prenatales ms frecuentes durante el tercer trimestre. Durante una visita prenatal de rutina, el mdico le har un examen fsico, le realizar pruebas y hablar con usted de su salud general. Cumpla con todas las visitas de seguimiento. Esto es importante. Dnde buscar ms informacin American Pregnancy Association (Asociacin Estadounidense del Embarazo): americanpregnancy.org American College of Obstetricians and Gynecologists (Colegio Estadounidense de Obstetras y Gineclogos): acog.org/womens-health/pregnancy? Office on Women's Health (Oficina para la Salud de la Mujer): womenshealth.gov/pregnancy Comunquese con un mdico si tiene: Fiebre. Clicos leves en la   pelvis, presin en la pelvis o dolor persistente en la zona abdominal o la parte baja de la espalda. Vmitos o diarrea. Secrecin vaginal con mal olor u orina con mal olor. Dolor al orinar. Un dolor de cabeza que no desaparece despus de tomar analgsicos. Cambios en la visin o ve manchas delante de los ojos. Solicite ayuda de inmediato si: Rompe la bolsa. Tiene contracciones regulares separadas por menos de 5minutos. Tiene sangrado o  pequeas prdidas vaginales. Siente un dolor abdominal intenso. Tiene dificultad para respirar. Siente dolor en el pecho. Sufre episodios de desmayo. No ha sentido a su beb moverse durante el perodo de tiempo que le indic el mdico. Tiene dolor, hinchazn o enrojecimiento nuevos en un brazo o una pierna o se produce un aumento de alguno de estos sntomas. Resumen El tercer trimestre del embarazo comprende desde la semana28 hasta la semana 40 (desde el mes7 hasta el mes9). Puede tener ms problemas para dormir. Esto puede deberse al tamao de su abdomen, una mayor necesidad de orinar y un aumento en el metabolismo de su cuerpo. Le realizarn exmenes prenatales ms frecuentes durante el tercer trimestre. Cumpla con todas las visitas de seguimiento. Esto es importante. Esta informacin no tiene como fin reemplazar el consejo del mdico. Asegrese de hacerle al mdico cualquier pregunta que tenga. Document Revised: 10/12/2019 Document Reviewed: 10/12/2019 Elsevier Patient Education  2022 Elsevier Inc.  

## 2021-03-09 ENCOUNTER — Encounter: Payer: Self-pay | Admitting: Obstetrics and Gynecology

## 2021-03-09 ENCOUNTER — Ambulatory Visit (INDEPENDENT_AMBULATORY_CARE_PROVIDER_SITE_OTHER): Payer: Self-pay | Admitting: Obstetrics and Gynecology

## 2021-03-09 ENCOUNTER — Ambulatory Visit: Payer: Self-pay | Admitting: *Deleted

## 2021-03-09 ENCOUNTER — Ambulatory Visit (INDEPENDENT_AMBULATORY_CARE_PROVIDER_SITE_OTHER): Payer: Self-pay

## 2021-03-09 ENCOUNTER — Other Ambulatory Visit: Payer: Self-pay

## 2021-03-09 VITALS — BP 126/87 | HR 85 | Wt 163.3 lb

## 2021-03-09 DIAGNOSIS — O10013 Pre-existing essential hypertension complicating pregnancy, third trimester: Secondary | ICD-10-CM

## 2021-03-09 DIAGNOSIS — Z3A35 35 weeks gestation of pregnancy: Secondary | ICD-10-CM

## 2021-03-09 DIAGNOSIS — Z789 Other specified health status: Secondary | ICD-10-CM

## 2021-03-09 DIAGNOSIS — O0990 Supervision of high risk pregnancy, unspecified, unspecified trimester: Secondary | ICD-10-CM

## 2021-03-11 NOTE — Progress Notes (Signed)
   PRENATAL VISIT NOTE  Subjective:  Alexis Potts is a 35 y.o. [phone] at [redacted]w[redacted]d being seen today for ongoing prenatal care.  She is currently monitored for the following issues for this high-risk pregnancy and has Language barrier; Supervision of high risk pregnancy, antepartum; and Pre-existing essential hypertension during pregnancy in third trimester on their problem list.  Patient reports no complaints.  Contractions: Irregular. Vag. Bleeding: None.  Movement: Present. Denies leaking of fluid.   The following portions of the patient's history were reviewed and updated as appropriate: allergies, current medications, past family history, past medical history, past social history, past surgical history and problem list.   Objective:   Vitals:   03/09/21 1352  BP: 126/87  Pulse: 85  Weight: 163 lb 4.8 oz (74.1 kg)    Fetal Status: Fetal Heart Rate (bpm): NST   Movement: Present     General:  Alert, oriented and cooperative. Patient is in no acute distress.  Skin: Skin is warm and dry. No rash noted.   Cardiovascular: Normal heart rate noted  Respiratory: Normal respiratory effort, no problems with respiration noted  Abdomen: Soft, gravid, appropriate for gestational age.  Pain/Pressure: Absent     Pelvic: Cervical exam deferred        Extremities: Normal range of motion.     Mental Status: Normal mood and affect. Normal behavior. Normal judgment and thought content.   Assessment and Plan:  Pregnancy: [id number] at [redacted]w[redacted]d 1. Supervision of high risk pregnancy, antepartum GBS nv. Potentially set up 39wk IOL nv.  2. Pre-existing essential hypertension during pregnancy in third trimester Confirms on labetalol 100 bid. Pt doing well on this. Bpp 10/10, cephalic today. Continue with weekly testing cwh 11/7: efw 35%, 2002gm, ac 68%, ceph, afi 10  3. Language barrier Interpreter used  4. [redacted] weeks gestation of pregnancy  Preterm labor symptoms and general obstetric  precautions including but not limited to vaginal bleeding, contractions, leaking of fluid and fetal movement were reviewed in detail with the patient. Please refer to After Visit Summary for other counseling recommendations.   Return in about 2 weeks (around 03/23/2021), or if symptoms worsen or fail to improve  *Please print appt list for pt.  Future Appointments  Date Time Provider Department Center  03/16/2021  3:15 PM [facility] NST [facility]  03/23/2021  2:30 PM [facility] NURSE [facility]  03/23/2021  2:45 PM [facility] US6 [facility]  03/23/2021  3:55 PM Adam Phenix, MD [facility]  03/30/2021  8:15 AM [facility] NST [facility]  03/30/2021  9:15 AM Milas Hock, MD [facility]  04/06/2021  9:15 AM [facility] NST [facility]  04/06/2021 10:15 AM Allayne Stack, DO [facility]    [place] Bing, MD

## 2021-03-16 ENCOUNTER — Ambulatory Visit: Payer: Self-pay | Admitting: *Deleted

## 2021-03-16 ENCOUNTER — Other Ambulatory Visit: Payer: Self-pay

## 2021-03-16 ENCOUNTER — Ambulatory Visit (INDEPENDENT_AMBULATORY_CARE_PROVIDER_SITE_OTHER): Payer: Self-pay

## 2021-03-16 VITALS — BP 125/82 | HR 88

## 2021-03-16 DIAGNOSIS — O10013 Pre-existing essential hypertension complicating pregnancy, third trimester: Secondary | ICD-10-CM

## 2021-03-16 NOTE — Progress Notes (Signed)

## 2021-03-23 ENCOUNTER — Other Ambulatory Visit (HOSPITAL_COMMUNITY)
Admission: RE | Admit: 2021-03-23 | Discharge: 2021-03-23 | Disposition: A | Discharge status: Home / Self Care | Payer: Self-pay | Source: Ambulatory Visit | Attending: Obstetrics & Gynecology | Admitting: Obstetrics & Gynecology

## 2021-03-23 ENCOUNTER — Encounter: Payer: Self-pay | Admitting: *Deleted

## 2021-03-23 ENCOUNTER — Ambulatory Visit (INDEPENDENT_AMBULATORY_CARE_PROVIDER_SITE_OTHER): Payer: Self-pay | Admitting: Obstetrics & Gynecology

## 2021-03-23 ENCOUNTER — Other Ambulatory Visit: Payer: Self-pay

## 2021-03-23 ENCOUNTER — Ambulatory Visit: Payer: Self-pay | Admitting: *Deleted

## 2021-03-23 ENCOUNTER — Ambulatory Visit: Payer: Self-pay | Attending: Obstetrics and Gynecology

## 2021-03-23 VITALS — BP 123/87 | HR 80 | Wt 166.4 lb

## 2021-03-23 VITALS — BP 120/84 | HR 78

## 2021-03-23 DIAGNOSIS — O10913 Unspecified pre-existing hypertension complicating pregnancy, third trimester: Secondary | ICD-10-CM | POA: Insufficient documentation

## 2021-03-23 DIAGNOSIS — O0990 Supervision of high risk pregnancy, unspecified, unspecified trimester: Secondary | ICD-10-CM

## 2021-03-23 DIAGNOSIS — O10013 Pre-existing essential hypertension complicating pregnancy, third trimester: Secondary | ICD-10-CM

## 2021-03-23 DIAGNOSIS — Z758 Other problems related to medical facilities and other health care: Secondary | ICD-10-CM

## 2021-03-23 DIAGNOSIS — Z789 Other specified health status: Secondary | ICD-10-CM

## 2021-03-23 DIAGNOSIS — Z3A36 36 weeks gestation of pregnancy: Secondary | ICD-10-CM

## 2021-03-23 NOTE — Progress Notes (Signed)
   PRENATAL VISIT NOTE  Subjective:  Alexis Potts is a 35 y.o. [phone] at [redacted]w[redacted]d being seen today for ongoing prenatal care.  She is currently monitored for the following issues for this high-risk pregnancy and has Language barrier; Supervision of high risk pregnancy, antepartum; and Pre-existing essential hypertension during pregnancy in third trimester on their problem list.  Patient reports no complaints.  Contractions: Not present. Vag. Bleeding: None.  Movement: Present. Denies leaking of fluid.   The following portions of the patient's history were reviewed and updated as appropriate: allergies, current medications, past family history, past medical history, past social history, past surgical history and problem list.   Objective:   Vitals:   03/23/21 1606  BP: 123/87  Pulse: 80  Weight: 166 lb 6.4 oz (75.5 kg)    Fetal Status: Fetal Heart Rate (bpm): 158   Movement: Present  Presentation: Vertex  General:  Alert, oriented and cooperative. Patient is in no acute distress.  Skin: Skin is warm and dry. No rash noted.   Cardiovascular: Normal heart rate noted  Respiratory: Normal respiratory effort, no problems with respiration noted  Abdomen: Soft, gravid, appropriate for gestational age.  Pain/Pressure: Present     Pelvic: Cervical exam performed in the presence of a chaperone Dilation: Fingertip Effacement (%): Thick Station: Ballotable  Extremities: Normal range of motion.     Mental Status: Normal mood and affect. Normal behavior. Normal judgment and thought content.   Assessment and Plan:  Pregnancy: [id number] at [redacted]w[redacted]d 1. Supervision of high risk pregnancy, antepartum Routine tests - Culture, beta strep (group b only) - GC/Chlamydia probe amp ([place])not at [facility]  Preterm labor symptoms and general obstetric precautions including but not limited to vaginal bleeding, contractions, leaking of fluid and fetal movement were reviewed in detail with the  patient. Please refer to After Visit Summary for other counseling recommendations.   Return in about 1 week (around 03/30/2021).  Future Appointments  Date Time Provider Department Center  03/30/2021  8:15 AM [facility] NST [facility]  03/30/2021  9:15 AM Milas Hock, MD [facility]  04/06/2021  9:15 AM [facility] NST [facility]  04/06/2021 10:15 AM Allayne Stack, DO [facility]    Scheryl Darter, MD

## 2021-03-25 LAB — GC/CHLAMYDIA PROBE AMP (~~LOC~~) NOT AT ARMC
Chlamydia: NEGATIVE
Comment: NEGATIVE
Comment: NORMAL
Neisseria Gonorrhea: NEGATIVE

## 2021-03-27 LAB — CULTURE, BETA STREP (GROUP B ONLY): Strep Gp B Culture: NEGATIVE

## 2021-03-30 ENCOUNTER — Other Ambulatory Visit: Payer: Self-pay

## 2021-03-30 ENCOUNTER — Ambulatory Visit (INDEPENDENT_AMBULATORY_CARE_PROVIDER_SITE_OTHER): Payer: Self-pay | Admitting: Obstetrics and Gynecology

## 2021-03-30 ENCOUNTER — Ambulatory Visit (INDEPENDENT_AMBULATORY_CARE_PROVIDER_SITE_OTHER): Payer: Self-pay

## 2021-03-30 ENCOUNTER — Ambulatory Visit: Payer: Self-pay | Admitting: *Deleted

## 2021-03-30 ENCOUNTER — Encounter: Payer: Self-pay | Admitting: Obstetrics and Gynecology

## 2021-03-30 VITALS — BP 110/86 | HR 77

## 2021-03-30 DIAGNOSIS — O10013 Pre-existing essential hypertension complicating pregnancy, third trimester: Secondary | ICD-10-CM

## 2021-03-30 DIAGNOSIS — O0990 Supervision of high risk pregnancy, unspecified, unspecified trimester: Secondary | ICD-10-CM

## 2021-03-30 DIAGNOSIS — Z789 Other specified health status: Secondary | ICD-10-CM

## 2021-03-30 DIAGNOSIS — Z758 Other problems related to medical facilities and other health care: Secondary | ICD-10-CM

## 2021-03-30 NOTE — Progress Notes (Signed)
   PRENATAL VISIT NOTE  Subjective:  Alexis Potts is a 35 y.o. [phone] at [redacted]w[redacted]d being seen today for ongoing prenatal care.  She is currently monitored for the following issues for this high-risk pregnancy and has Language barrier; Supervision of high risk pregnancy, antepartum; and Pre-existing essential hypertension during pregnancy in third trimester on their problem list.  Patient reports no complaints.  Contractions: Not present. Vag. Bleeding: None.  Movement: Present. Denies leaking of fluid.   The following portions of the patient's history were reviewed and updated as appropriate: allergies, current medications, past family history, past medical history, past social history, past surgical history and problem list.   Objective:   Vitals:   03/30/21 0907  BP: 110/86  Pulse: 77    Fetal Status: Fetal Heart Rate (bpm): RNST   Movement: Present     General:  Alert, oriented and cooperative. Patient is in no acute distress.  Skin: Skin is warm and dry. No rash noted.   Cardiovascular: Normal heart rate noted  Respiratory: Normal respiratory effort, no problems with respiration noted  Abdomen: Soft, gravid, appropriate for gestational age.  Pain/Pressure: Present     Pelvic: Cervical exam deferred        Extremities: Normal range of motion.     Mental Status: Normal mood and affect. Normal behavior. Normal judgment and thought content.   Assessment and Plan:  Pregnancy: [id number] at [redacted]w[redacted]d 1. Supervision of high risk pregnancy, antepartum - GBS neg   2. Pre-existing essential hypertension during pregnancy in third trimester - BP wnl today, NST weekly along with BPP.  - Last growth 12/5 was wnl, 62%ile, normal afi, cephalic - IOL scheduled for 12/21. She prefers morning if possible. Discussed process of IOL.   3. Language barrier - Interpreter used throughout  Term labor symptoms and general obstetric precautions including but not limited to vaginal bleeding,  contractions, leaking of fluid and fetal movement were reviewed in detail with the patient. Please refer to After Visit Summary for other counseling recommendations.   Return in about 1 week (around 04/06/2021) [facility], NST/BPP as scheduled.  Future Appointments  Date Time Provider Department Center  04/06/2021  9:15 AM [facility] NST [facility]  04/06/2021 10:15 AM Allayne Stack, DO [facility]    Milas Hock, MD

## 2021-03-31 NOTE — Progress Notes (Signed)
NST:  Baseline: 135 bpm, Variability: Good {> 6 bpm), Accelerations: Reactive, and Decelerations: Absent   

## 2021-04-01 ENCOUNTER — Other Ambulatory Visit: Payer: Self-pay | Admitting: Advanced Practice Midwife

## 2021-04-02 ENCOUNTER — Other Ambulatory Visit: Payer: Self-pay | Admitting: Obstetrics and Gynecology

## 2021-04-06 ENCOUNTER — Ambulatory Visit (INDEPENDENT_AMBULATORY_CARE_PROVIDER_SITE_OTHER): Payer: Self-pay | Admitting: Family Medicine

## 2021-04-06 ENCOUNTER — Ambulatory Visit: Payer: Self-pay | Admitting: *Deleted

## 2021-04-06 ENCOUNTER — Ambulatory Visit (INDEPENDENT_AMBULATORY_CARE_PROVIDER_SITE_OTHER): Payer: Self-pay

## 2021-04-06 ENCOUNTER — Other Ambulatory Visit: Payer: Self-pay

## 2021-04-06 VITALS — BP 123/84 | HR 89 | Wt 167.3 lb

## 2021-04-06 DIAGNOSIS — O10013 Pre-existing essential hypertension complicating pregnancy, third trimester: Secondary | ICD-10-CM

## 2021-04-06 DIAGNOSIS — O0990 Supervision of high risk pregnancy, unspecified, unspecified trimester: Secondary | ICD-10-CM

## 2021-04-06 DIAGNOSIS — Z789 Other specified health status: Secondary | ICD-10-CM

## 2021-04-06 NOTE — Progress Notes (Signed)
Patient left before appointment due to having an appointment for her child at 104. BPP 8/10 earlier this morning and reported to RN that she was doing well. Denied any leakage of fluid, vaginal bleeding, or contractions. Reported normal fetal movement.   Vitals with BMI 04/06/2021 03/30/2021 03/23/2021  Height - - -  Weight 167 lbs 5 oz - 166 lbs 6 oz  BMI - - -  Systolic 123 110 003  Diastolic 84 86 87  Pulse 89 77 80     IOL scheduled on 12/21, in 2 days. MAU precautions were discussed by RN.   Allayne Stack, DO

## 2021-04-06 NOTE — Progress Notes (Signed)
Pt has no c/o.  BPP - 8/10, (0- breathing)   IOL scheduled on 12/21.

## 2021-04-08 ENCOUNTER — Encounter (HOSPITAL_COMMUNITY): Payer: Self-pay | Admitting: Family Medicine

## 2021-04-08 ENCOUNTER — Other Ambulatory Visit: Payer: Self-pay

## 2021-04-08 ENCOUNTER — Inpatient Hospital Stay (HOSPITAL_COMMUNITY)
Admission: AD | Admit: 2021-04-08 | Discharge: 2021-04-10 | DRG: 807 | Disposition: A | Discharge status: Home / Self Care | Payer: Medicaid Other | Attending: Obstetrics and Gynecology | Admitting: Obstetrics and Gynecology

## 2021-04-08 ENCOUNTER — Inpatient Hospital Stay (HOSPITAL_COMMUNITY): Payer: Medicaid Other

## 2021-04-08 DIAGNOSIS — Z758 Other problems related to medical facilities and other health care: Secondary | ICD-10-CM | POA: Diagnosis present

## 2021-04-08 DIAGNOSIS — Z7982 Long term (current) use of aspirin: Secondary | ICD-10-CM

## 2021-04-08 DIAGNOSIS — Z20822 Contact with and (suspected) exposure to covid-19: Secondary | ICD-10-CM | POA: Diagnosis present

## 2021-04-08 DIAGNOSIS — O0990 Supervision of high risk pregnancy, unspecified, unspecified trimester: Secondary | ICD-10-CM

## 2021-04-08 DIAGNOSIS — O10013 Pre-existing essential hypertension complicating pregnancy, third trimester: Secondary | ICD-10-CM | POA: Diagnosis present

## 2021-04-08 DIAGNOSIS — O1002 Pre-existing essential hypertension complicating childbirth: Secondary | ICD-10-CM | POA: Diagnosis present

## 2021-04-08 DIAGNOSIS — Z789 Other specified health status: Secondary | ICD-10-CM | POA: Diagnosis present

## 2021-04-08 DIAGNOSIS — Z3A39 39 weeks gestation of pregnancy: Secondary | ICD-10-CM

## 2021-04-08 LAB — COMPREHENSIVE METABOLIC PANEL
ALT: 26 U/L (ref 0–44)
AST: 29 U/L (ref 15–41)
Albumin: 2.9 g/dL — ABNORMAL LOW (ref 3.5–5.0)
Alkaline Phosphatase: 153 U/L — ABNORMAL HIGH (ref 38–126)
Anion gap: 7 (ref 5–15)
BUN: 6 mg/dL (ref 6–20)
CO2: 18 mmol/L — ABNORMAL LOW (ref 22–32)
Calcium: 8.9 mg/dL (ref 8.9–10.3)
Chloride: 107 mmol/L (ref 98–111)
Creatinine, Ser: 0.53 mg/dL (ref 0.44–1.00)
GFR, Estimated: >60 mL/min (ref >60)
Glucose, Bld: 84 mg/dL (ref 70–99)
Potassium: 4.1 mmol/L (ref 3.5–5.1)
Sodium: 132 mmol/L — ABNORMAL LOW (ref 135–145)
Total Bilirubin: 0.4 mg/dL (ref 0.3–1.2)
Total Protein: 6.5 g/dL (ref 6.5–8.1)

## 2021-04-08 LAB — TYPE AND SCREEN
ABO/RH(D): A POS
Antibody Screen: NEGATIVE

## 2021-04-08 LAB — CBC
HCT: 39.4 % (ref 36.0–46.0)
Hemoglobin: 13.5 g/dL (ref 12.0–15.0)
MCH: 31.4 pg (ref 26.0–34.0)
MCHC: 34.3 g/dL (ref 30.0–36.0)
MCV: 91.6 fL (ref 80.0–100.0)
Platelets: 218 10*3/uL (ref 150–400)
RBC: 4.3 MIL/uL (ref 3.87–5.11)
RDW: 13.9 % (ref 11.5–15.5)
WBC: 5.9 10*3/uL (ref 4.0–10.5)
nRBC: 0 % (ref 0.0–0.2)

## 2021-04-08 LAB — PROTEIN / CREATININE RATIO, URINE
Creatinine, Urine: 104.85 mg/dL
Protein Creatinine Ratio: 0.16 mg/mg{Cre} — ABNORMAL HIGH (ref 0.00–0.15)
Total Protein, Urine: 17 mg/dL

## 2021-04-08 LAB — RESP PANEL BY RT-PCR (FLU A&B, COVID) ARPGX2
Influenza A by PCR: NEGATIVE
Influenza B by PCR: NEGATIVE
SARS Coronavirus 2 by RT PCR: NEGATIVE

## 2021-04-08 MED ORDER — ACETAMINOPHEN 325 MG PO TABS: 650.0000 mg | ORAL_TABLET | ORAL | Status: DC | PRN

## 2021-04-08 MED ORDER — LIDOCAINE HCL (PF) 1 % IJ SOLN
30.0000 mL | INTRAMUSCULAR | Status: AC | PRN
  Administered 2021-04-09: 02:00:00 30 mL via SUBCUTANEOUS
  Filled 2021-04-08: qty 30

## 2021-04-08 MED ORDER — LABETALOL HCL 100 MG PO TABS
100.0000 mg | ORAL_TABLET | Freq: Two times a day (BID) | ORAL | Status: DC
  Administered 2021-04-08: 20:00:00 100 mg via ORAL
  Filled 2021-04-08: qty 1

## 2021-04-08 MED ORDER — OXYTOCIN-SODIUM CHLORIDE 30-0.9 UT/500ML-% IV SOLN: 2.5000 [IU]/h | INTRAVENOUS | Status: DC

## 2021-04-08 MED ORDER — OXYTOCIN BOLUS FROM INFUSION
333.0000 mL | Freq: Once | INTRAVENOUS | Status: AC
  Administered 2021-04-09: 01:00:00 333 mL via INTRAVENOUS

## 2021-04-08 MED ORDER — ONDANSETRON HCL 4 MG/2ML IJ SOLN: 4.0000 mg | Freq: Four times a day (QID) | INTRAMUSCULAR | Status: DC | PRN

## 2021-04-08 MED ORDER — OXYTOCIN-SODIUM CHLORIDE 30-0.9 UT/500ML-% IV SOLN: 1.0000 m[IU]/min | INTRAVENOUS | Status: DC

## 2021-04-08 MED ORDER — LACTATED RINGERS IV SOLN: 500.0000 mL | INTRAVENOUS | Status: DC | PRN

## 2021-04-08 MED ORDER — OXYCODONE-ACETAMINOPHEN 5-325 MG PO TABS: 1.0000 | ORAL_TABLET | ORAL | Status: DC | PRN

## 2021-04-08 MED ORDER — OXYCODONE-ACETAMINOPHEN 5-325 MG PO TABS: 2.0000 | ORAL_TABLET | ORAL | Status: DC | PRN

## 2021-04-08 MED ORDER — TERBUTALINE SULFATE 1 MG/ML IJ SOLN: 0.2500 mg | Freq: Once | INTRAMUSCULAR | Status: DC | PRN

## 2021-04-08 MED ORDER — MISOPROSTOL 50MCG HALF TABLET
50.0000 ug | ORAL_TABLET | ORAL | Status: DC | PRN
  Administered 2021-04-08 (×2): 50 ug via BUCCAL
  Filled 2021-04-08 (×2): qty 1

## 2021-04-08 MED ORDER — LACTATED RINGERS IV SOLN: INTRAVENOUS | Status: DC

## 2021-04-08 MED ORDER — OXYTOCIN-SODIUM CHLORIDE 30-0.9 UT/500ML-% IV SOLN
INTRAVENOUS | Status: AC
  Administered 2021-04-08: 22:00:00 2 m[IU]/min via INTRAVENOUS
  Filled 2021-04-08: qty 500

## 2021-04-08 MED ORDER — SOD CITRATE-CITRIC ACID 500-334 MG/5ML PO SOLN: 30.0000 mL | ORAL | Status: DC | PRN

## 2021-04-08 NOTE — Progress Notes (Addendum)
Labor Progress Note Alexis Potts is a 35 y.o. [id number] at [redacted]w[redacted]d presented for IOL for cHTN.  S: Feeling comfortable laying in bed, pain with contractions that is bearable. Discussed AROM risks and benefits with patient, which she understood and agreed to.  O:  BP 119/69    Pulse 81    Temp 98 F (36.7 C) (Oral)    Resp 19    Ht 5\' 2"  (1.575 m)    Wt 76.4 kg    LMP 07/09/2020 (Exact Date)    BMI 30.82 kg/m   FHT: FHR 140 bpm, moderate variability, + accels, no decels   CVE: Dilation: 4 Effacement (%): 50 Cervical Position: Posterior Station: -2 Presentation: Vertex Exam by:: Dr.Das   A&P: 35 y.o. 20 [redacted]w[redacted]d   #Labor: Progressing well. FB out at 1924, now 4-5 cm dilated. AROM'ed with clear scant fluid, and head well-applied with no cord prolapse. Will start pitocin 2x2 to further augment labor and reassess in 4 hours or sooner if indicated. #Pain: Per patient request, unsure if she wants epidural. Discussed pain management options #FWB: Cat 1 #GBS negative  #cHTN: BP in normal ranges on current regimen of home Labetolol 100mg  BID. No s/sx's of pre-E. Will watch closely.  iPad interpreter used for entirety of encounter.  [redacted]w[redacted]d, DO 9:56 PM

## 2021-04-08 NOTE — H&P (Signed)
OBSTETRIC ADMISSION HISTORY AND PHYSICAL  Alexis Potts is a 35 y.o. female 1982 with IUP at [redacted]w[redacted]d by LMP presenting for IOL due to [facility]. She reports +FMs, no LOF, no VB, no blurry vision, headaches, peripheral edema, or RUQ pain.  She plans on breast feeding. She is planning for an IUD outpatient for birth control postpartum.  She received her prenatal care at [facility].   Dating: By LMP --->  Estimated Date of Delivery: 04/15/21  Sono:   @[redacted]w[redacted]d , CWD, normal anatomy, cephalic presentation, posterior placental lie, 3080 g, 62% EFW  Prenatal History/Complications:  Chronic HTN (Labetalol 100 mg BID) Language barrier  Past Medical History: Past Medical History:  Diagnosis Date   Anemia    Hypertension    SAB (spontaneous abortion) 06/22/2018    Past Surgical History: Past Surgical History:  Procedure Laterality Date   NO PAST SURGERIES      Obstetrical History: OB History     Gravida  4   Para  2   Term  2   Preterm  0   AB  1   Living  2      SAB  1   IAB  0   Ectopic  0   Multiple  0   Live Births  2           Social History Social History   Socioeconomic History   Marital status: Single    Spouse name: Not on file   Number of children: Not on file   Years of education: Not on file   Highest education level: Not on file  Occupational History   Not on file  Tobacco Use   Smoking status: Never   Smokeless tobacco: Never  Vaping Use   Vaping Use: Never used  Substance and Sexual Activity   Alcohol use: Not Currently   Drug use: Never   Sexual activity: Yes    Birth control/protection: None  Other Topics Concern   Not on file  Social History Narrative   Not on file   Social Determinants of Health   Financial Resource Strain: Not on file  Food Insecurity: No Food Insecurity   Worried About Running Out of Food in the Last Year: Never true   Ran Out of Food in the Last Year: Never true  Transportation Needs: No  Transportation Needs   Lack of Transportation (Medical): No   Lack of Transportation (Non-Medical): No  Physical Activity: Not on file  Stress: Not on file  Social Connections: Not on file    Family History: Family History  Problem Relation Age of Onset   Diabetes Brother     Allergies: No Known Allergies  Medications Prior to Admission  Medication Sig Dispense Refill Last Dose   aspirin EC 81 MG tablet Take 1 tablet (81 mg total) by mouth daily. Swallow whole. 30 tablet 11 04/08/2021 at 0800   labetalol (NORMODYNE) 100 MG tablet Take 1 tablet (100 mg total) by mouth 2 (two) times daily. 60 tablet 2 04/08/2021 at 0800   Prenatal Vit-Fe Fumarate-FA (PREPLUS) 27-1 MG TABS Take 1 tablet by mouth daily. 30 tablet 6 04/07/2021 at 2200     Review of Systems  All systems reviewed and negative except as stated in HPI  Blood pressure 123/83, pulse 87, temperature 97.7 F (36.5 C), temperature source Oral, resp. rate 19, height 5\' 2"  (1.575 m), weight 76.4 kg, last menstrual period 07/09/2020, currently breastfeeding.  General appearance: alert, cooperative, and no distress Lungs:  normal work of breathing on room air  Heart: normal rate, warm and well perfused  Abdomen: soft, non-tender, gravid  Extremities: no LE edema or calf tenderness to palpation   Presentation:  Cephalic Fetal monitoring: Baseline 140 bpm, moderate variability, + accels, no decels  Uterine activity: Every 2-5 minutes  Dilation: 2 Effacement (%): 50 Station: Ballotable Exam by:: Dr. Mathis Fare   Prenatal labs: ABO, Rh: --/--/A POS (12/21 1022) Antibody: NEG (12/21 1022) Rubella: 13.20 (06/27 1014) RPR: Non Reactive (09/26 1056)  HBsAg: Negative (06/27 1014)  HIV: Non Reactive (09/26 1056)  GBS: Negative/-- (12/05 1653)  2 hr Glucola normal  Genetic screening - LR NIPS, AFP neg Anatomy US normal   Prenatal Transfer Tool  Maternal Diabetes: No Genetic Screening: Normal Maternal Ultrasounds/Referrals:  Normal Fetal Ultrasounds or other Referrals:  None Maternal Substance Abuse:  No Significant Maternal Medications:  Labetalol 100 mg BID Significant Maternal Lab Results: Group B Strep negative  Results for orders placed or performed during the hospital encounter of 04/08/21 (from the past 24 hour(s))  Type and screen   Collection Time: 04/08/21 10:22 AM  Result Value Ref Range   ABO/RH(D) A POS    Antibody Screen NEG    Sample Expiration      04/11/2021,2359 Performed at [facility], [address], [place], [place] [address]   CBC   Collection Time: 04/08/21 10:29 AM  Result Value Ref Range   WBC 5.9 4.0 - 10.5 K/uL   RBC 4.30 3.87 - 5.11 MIL/uL   Hemoglobin 13.5 12.0 - 15.0 g/dL   HCT 27.2 53.6 - 64.4 %   MCV 91.6 80.0 - 100.0 fL   MCH 31.4 26.0 - 34.0 pg   MCHC 34.3 30.0 - 36.0 g/dL   RDW 03.4 74.2 - 59.5 %   Platelets 218 150 - 400 K/uL   nRBC 0.0 0.0 - 0.2 %    Patient Active Problem List   Diagnosis Date Noted   Pre-existing essential hypertension during pregnancy in third trimester 02/10/2021   Supervision of high risk pregnancy, antepartum 10/13/2020   Language barrier 08/13/2019    Assessment/Plan:  Alexis Potts is a 35 y.o. [id number] at [redacted]w[redacted]d here for IOL due to [facility].  #Labor: Will start induction with buccal Cytotec 50 mcg and reassess in 4 hours.  #Pain: PRN #FWB: Cat 1  #ID:  GBS neg #MOF: Breast #MOC: IUD outpatient  #Circ:  Unsure, will discuss postpartum   #cHTN: BP normal range on admission. No symptoms. CBC, CMP, UPC ordered. Will follow up results. Continue home Labetalol 100 mg BID.   Worthy Rancher, MD  04/08/2021, 11:40 AM

## 2021-04-08 NOTE — Progress Notes (Signed)
Labor Progress Note Alexis Potts is a 35 y.o. [id number] at [redacted]w[redacted]d who presented for IOL due to [facility].  S: Doing well. Bouncing on ball. No concerns at this time.   O:  BP 120/84    Pulse 87    Temp 97.7 F (36.5 C) (Oral)    Resp 19    Ht 5\' 2"  (1.575 m)    Wt 76.4 kg    LMP 07/09/2020 (Exact Date)    BMI 30.82 kg/m   EFM: Baseline 135 bpm, moderate variability, + accels, no decels  Toco: Every 3 minutes   CVE: Dilation: 2 Effacement (%): 50 Station: Ballotable Presentation: Vertex Exam by:: Dr. [email or web address]   A&P: 35 y.o. 20 [redacted]w[redacted]d   #Labor: SVE similar to prior. Foley balloon discussed and patient was agreeable. Foley placed without difficulty. Will give another dose of Cytotec 50 mcg buccally and reassess in 4 hours.  #Pain: PRN; maternal support for now  #FWB: Cat 1  #GBS negative  #cHTN: Normal range BP on current regimen. No symptoms. Pre-E labs normal on admission. Will continue to monitor.   In-person interpreter used for entirety of encounter.   [redacted]w[redacted]d, MD 5:08 PM

## 2021-04-08 NOTE — Plan of Care (Signed)

## 2021-04-09 ENCOUNTER — Encounter (HOSPITAL_COMMUNITY): Payer: Self-pay | Admitting: Obstetrics and Gynecology

## 2021-04-09 DIAGNOSIS — Z3A39 39 weeks gestation of pregnancy: Secondary | ICD-10-CM

## 2021-04-09 DIAGNOSIS — O1002 Pre-existing essential hypertension complicating childbirth: Secondary | ICD-10-CM

## 2021-04-09 LAB — RPR: RPR Ser Ql: NONREACTIVE

## 2021-04-09 MED ORDER — TRANEXAMIC ACID-NACL 1000-0.7 MG/100ML-% IV SOLN
INTRAVENOUS | Status: AC
  Administered 2021-04-09: 01:00:00 1000 mg
  Filled 2021-04-09: qty 100

## 2021-04-09 MED ORDER — ONDANSETRON HCL 4 MG/2ML IJ SOLN: 4.0000 mg | INTRAMUSCULAR | Status: DC | PRN

## 2021-04-09 MED ORDER — ACETAMINOPHEN 325 MG PO TABS
650.0000 mg | ORAL_TABLET | ORAL | Status: DC | PRN
  Filled 2021-04-09: qty 2

## 2021-04-09 MED ORDER — MEDROXYPROGESTERONE ACETATE 150 MG/ML IM SUSP: 150.0000 mg | INTRAMUSCULAR | Status: DC | PRN

## 2021-04-09 MED ORDER — BENZOCAINE-MENTHOL 20-0.5 % EX AERO
1.0000 "application " | INHALATION_SPRAY | CUTANEOUS | Status: DC | PRN
  Administered 2021-04-09: 1 via TOPICAL
  Filled 2021-04-09: qty 56

## 2021-04-09 MED ORDER — SENNOSIDES-DOCUSATE SODIUM 8.6-50 MG PO TABS
2.0000 | ORAL_TABLET | Freq: Every day | ORAL | Status: DC
  Administered 2021-04-10: 10:00:00 2 via ORAL
  Filled 2021-04-09: qty 2

## 2021-04-09 MED ORDER — FUROSEMIDE 20 MG PO TABS
20.0000 mg | ORAL_TABLET | Freq: Every day | ORAL | Status: DC
  Administered 2021-04-10: 10:00:00 20 mg via ORAL
  Filled 2021-04-09: qty 1

## 2021-04-09 MED ORDER — TRANEXAMIC ACID-NACL 1000-0.7 MG/100ML-% IV SOLN: 1000.0000 mg | INTRAVENOUS | Status: DC

## 2021-04-09 MED ORDER — PRENATAL MULTIVITAMIN CH
1.0000 | ORAL_TABLET | Freq: Every day | ORAL | Status: DC
  Administered 2021-04-09 – 2021-04-10 (×2): 1 via ORAL
  Filled 2021-04-09 (×2): qty 1

## 2021-04-09 MED ORDER — DIBUCAINE (PERIANAL) 1 % EX OINT: 1.0000 "application " | TOPICAL_OINTMENT | CUTANEOUS | Status: DC | PRN

## 2021-04-09 MED ORDER — COCONUT OIL OIL
1.0000 "application " | TOPICAL_OIL | Status: DC | PRN
  Administered 2021-04-09: 1 via TOPICAL

## 2021-04-09 MED ORDER — WITCH HAZEL-GLYCERIN EX PADS: 1.0000 "application " | MEDICATED_PAD | CUTANEOUS | Status: DC | PRN

## 2021-04-09 MED ORDER — SIMETHICONE 80 MG PO CHEW: 80.0000 mg | CHEWABLE_TABLET | ORAL | Status: DC | PRN

## 2021-04-09 MED ORDER — DIPHENHYDRAMINE HCL 25 MG PO CAPS: 25.0000 mg | ORAL_CAPSULE | Freq: Four times a day (QID) | ORAL | Status: DC | PRN

## 2021-04-09 MED ORDER — MEASLES, MUMPS & RUBELLA VAC IJ SOLR: 0.5000 mL | Freq: Once | INTRAMUSCULAR | Status: DC

## 2021-04-09 MED ORDER — IBUPROFEN 600 MG PO TABS
600.0000 mg | ORAL_TABLET | Freq: Four times a day (QID) | ORAL | Status: DC
  Administered 2021-04-09 – 2021-04-10 (×6): 600 mg via ORAL
  Filled 2021-04-09 (×6): qty 1

## 2021-04-09 MED ORDER — METHYLERGONOVINE MALEATE 0.2 MG/ML IJ SOLN
0.2000 mg | Freq: Once | INTRAMUSCULAR | Status: AC
  Administered 2021-04-09: 01:00:00 0.2 mg via INTRAMUSCULAR

## 2021-04-09 MED ORDER — ONDANSETRON HCL 4 MG PO TABS: 4.0000 mg | ORAL_TABLET | ORAL | Status: DC | PRN

## 2021-04-09 MED ORDER — METHYLERGONOVINE MALEATE 0.2 MG/ML IJ SOLN
INTRAMUSCULAR | Status: AC
  Filled 2021-04-09: qty 1

## 2021-04-09 MED ORDER — TETANUS-DIPHTH-ACELL PERTUSSIS 5-2.5-18.5 LF-MCG/0.5 IM SUSY: 0.5000 mL | PREFILLED_SYRINGE | Freq: Once | INTRAMUSCULAR | Status: DC

## 2021-04-09 NOTE — Lactation Note (Signed)
This note was copied from a baby's chart. Lactation Consultation Note Mom had baby on the breast when entered rm. Mom stated the baby is hungry he's eating good. Mom BF her oldest child for 3 yrs and her youngest child for 1 yr. Mom stated she doesn't know how long she will BF this baby, maybe 1 yr or 2 yrs she doesn't know. Baby BF well in cradle position. LC just adjusted head to be closer to mom.  Baby had good latch. Mom is breast formula choice. Encouraged breast first.  Patient Name: Boy Evoleht Hovatter [id number] Date: 04/09/2021 Reason for consult: L&D Initial assessment;Term Age:47 hours  Maternal Data    Feeding    LATCH Score Latch: Grasps breast easily, tongue down, lips flanged, rhythmical sucking.  Audible Swallowing: None  Type of Nipple: Everted at rest and after stimulation  Comfort (Breast/Nipple): Soft / non-tender  Hold (Positioning): No assistance needed to correctly position infant at breast.  LATCH Score: 8   Lactation Tools Discussed/Used    Interventions Interventions: Support pillows;Skin to skin;Breast compression  Discharge    Consult Status Consult Status: Follow-up from L&D Date: 04/09/21 Follow-up type: In-patient    Keira Bohlin, Diamond Nickel 04/09/2021, 2:16 AM

## 2021-04-09 NOTE — Progress Notes (Signed)
Rn called Alexis Potts to report that patients blood pressure was 136/89 pulse 72 then 125/90 pulse 78 an hour later. States that they will continue to monitor the patients blood pressure.

## 2021-04-09 NOTE — Progress Notes (Signed)
Notified first call L&D about elevated DBP's.  Pt asymptomatic, denies HA, vision changes, epigastric pain.  Will continue monitor.

## 2021-04-09 NOTE — Discharge Summary (Addendum)
Postpartum Discharge Summary    Patient Name: Alexis Potts DOB: [birth year] MRN: [id number]  Date of admission: 04/08/2021 Delivery date:04/09/2021  Delivering provider: Renard Matter  Date of discharge: 04/10/2021  Admitting diagnosis: Pregnancy [Z34.90] Intrauterine pregnancy: [redacted]w[redacted]d    Secondary diagnosis:  Active Problems:   Language barrier   Supervision of high risk pregnancy, antepartum   Pre-existing essential hypertension during pregnancy in third trimester   Vaginal delivery  Additional problems: None    Discharge diagnosis: Term Pregnancy Delivered and CHTN                                              Post partum procedures: None Augmentation: AROM, Pitocin, Cytotec, and IP Foley Complications: None  Hospital course: Induction of Labor With Vaginal Delivery   35y.o. yo [id number] admitted to the hospital 04/08/2021 for induction of labor.  Indication for induction:  cHTN .  Patient had an uncomplicated labor course as follows: Membrane Rupture Time/Date: 9:41 PM ,04/08/2021   Delivery Method:Vaginal, Spontaneous  Episiotomy: None  Lacerations:  1st degree  Details of delivery can be found in separate delivery note.  Patient had elevated blood pressures with diastolics in the 9085Ind given her cHTN she was started on Procardia 3063maily as well as a 5 day course of Lasix. Patient is discharged home 04/10/21.  Newborn Data: Birth date:04/09/2021  Birth time:1:20 AM  Gender:Female  Living status:Living  Apgars:9 ,9  Weight:3260 g   Magnesium Sulfate received: No BMZ received: No Rhophylac:N/A MMR:N/A T-DaP:Given prenatally Flu: given prenatally Transfusion:No  Physical exam  Vitals:   04/09/21 1142 04/09/21 1247 04/09/21 1832 04/09/21 2233  BP: 122/90 (!) 123/93 111/71 107/78  Pulse: 77 86 88 80  Resp:  _0 Temp:  98.2 F (36.8 C) 98.2 F (36.8 C) 97.7 F (36.5 C)  TempSrc:  Oral Oral Oral  SpO2:  100% 99% 99%  Weight:       Height:       *Interpreter used throughout entirety of exam General: alert, cooperative, and no distress Lochia: appropriate Uterine Fundus: firm DVT Evaluation: No evidence of DVT seen on physical exam. No cords or calf tenderness. No significant calf/ankle edema. Labs: Lab Results  Component Value Date   WBC 5.9 04/08/2021   HGB 13.5 04/08/2021   HCT 39.4 04/08/2021   MCV 91.6 04/08/2021   PLT 218 04/08/2021   CMP Latest Ref Rng & Units 04/08/2021  Glucose 70 - 99 mg/dL 84  BUN 6 - 20 mg/dL 6  Creatinine 0.44 - 1.00 mg/dL 0.53  Sodium 135 - 145 mmol/L 132(L)  Potassium 3.5 - 5.1 mmol/L 4.1  Chloride 98 - 111 mmol/L 107  CO2 22 - 32 mmol/L 18(L)  Calcium 8.9 - 10.3 mg/dL 8.9  Total Protein 6.5 - 8.1 g/dL 6.5  Total Bilirubin 0.3 - 1.2 mg/dL 0.4  Alkaline Phos 38 - 126 U/L 153(H)  AST 15 - 41 U/L 29  ALT 0 - 44 U/L 26   Edinburgh Score: Edinburgh Postnatal Depression Scale Screening Tool 04/09/2021  I have been able to laugh and see the funny side of things. 0  I have looked forward with enjoyment to things. 0  I have blamed myself unnecessarily when things went wrong. 0  I have been anxious or worried for no  good reason. 1  I have felt scared or panicky for no good reason. 1  Things have been getting on top of me. 1  I have been so unhappy that I have had difficulty sleeping. 0  I have felt sad or miserable. 0  I have been so unhappy that I have been crying. 1  The thought of harming myself has occurred to me. 0  Edinburgh Postnatal Depression Scale Total 4     After visit meds:  Allergies as of 04/10/2021   No Known Allergies      Medication List     STOP taking these medications    aspirin EC 81 MG tablet   labetalol 100 MG tablet Commonly known as: NORMODYNE       TAKE these medications    acetaminophen 325 MG tablet Commonly known as: Tylenol Take 2 tablets (650 mg total) by mouth every 4 (four) hours as needed (for pain scale < 4).    furosemide 20 MG tablet Commonly known as: LASIX Take 1 tablet (20 mg total) by mouth daily for 5 days.   ibuprofen 600 MG tablet Commonly known as: ADVIL Take 1 tablet (600 mg total) by mouth every 6 (six) hours.   NIFEdipine 30 MG 24 hr tablet Commonly known as: PROCARDIA-XL/NIFEDICAL-XL Take 1 tablet (30 mg total) by mouth daily.   PrePLUS 27-1 MG Tabs Take 1 tablet by mouth daily.         Discharge home in stable condition Infant Feeding: Bottle and Breast Infant Disposition:home with mother Discharge instruction: per After Visit Summary and Postpartum booklet. Activity: Advance as tolerated. Pelvic rest for 6 weeks.  Diet: routine diet Future Appointments:No future appointments. Follow up Visit: Message sent to [facility] by Dr. Cy Blamer on 04/09/2021  Please schedule this patient for a In person postpartum visit in 4 weeks with the following provider: Any provider. Additional Postpartum F/U:BP check 1 week  High risk pregnancy complicated by:  cHTN Delivery mode:  Vaginal, Spontaneous  Anticipated Birth Control:   outpt IUD  04/10/2021 Orvis Brill, DO   GME ATTESTATION:  I saw and evaluated the patient. I agree with the findings and the plan of care as documented in the residents note and have made all necessary edits  Renard Matter, MD, MPH OB Fellow, [place] for [place] 04/10/2021 7:04 AM\

## 2021-04-09 NOTE — Progress Notes (Signed)
Alexis Potts [phone] mom request formula

## 2021-04-09 NOTE — Lactation Note (Signed)
This note was copied from a baby's chart. Lactation Consultation Note  Patient Name: Alexis Potts [id number] Date: 04/09/2021 Reason for consult: Follow-up assessment;Term Age:34 hours   Lactation Follow Up Consult:  Mother requested latch assistance.  Arrived as soon as mother called and RN in room assisting.  Informed me that mother desired to formula feed at this feeding session.  Asked mother to call for lactation assistance with the next feeding.  Mother verbalized understanding.  Spanish brochure left at bedside.   Maternal Data    Feeding Mother's Current Feeding Choice: Breast Milk and Formula  LATCH Score                    Lactation Tools Discussed/Used    Interventions    Discharge    Consult Status Consult Status: Follow-up Date: 04/09/21 Follow-up type: In-patient    Kanishk Stroebel R Davian Wollenberg 04/09/2021, 6:29 AM

## 2021-04-09 NOTE — Plan of Care (Signed)
Problem: Education: Goal: Knowledge of Childbirth will improve 04/09/2021 0235 by Thera Flake, RN Outcome: Completed/Met 04/08/2021 1934 by Thera Flake, RN Outcome: Progressing Goal: Ability to make informed decisions regarding treatment and plan of care will improve 04/09/2021 0235 by Thera Flake, RN Outcome: Completed/Met 04/08/2021 1934 by Thera Flake, RN Outcome: Progressing Goal: Ability to state and carry out methods to decrease the pain will improve 04/09/2021 0235 by Thera Flake, RN Outcome: Completed/Met 04/08/2021 1934 by Thera Flake, RN Outcome: Progressing Goal: Individualized Educational Video(s) 04/09/2021 0235 by Thera Flake, RN Outcome: Completed/Met 04/08/2021 1934 by Thera Flake, RN Outcome: Progressing   Problem: Coping: Goal: Ability to verbalize concerns and feelings about labor and delivery will improve 04/09/2021 0235 by Thera Flake, RN Outcome: Completed/Met 04/08/2021 1934 by Thera Flake, RN Outcome: Progressing   Problem: Life Cycle: Goal: Ability to make normal progression through stages of labor will improve 04/09/2021 0235 by Thera Flake, RN Outcome: Completed/Met 04/08/2021 1934 by Thera Flake, RN Outcome: Progressing Goal: Ability to effectively push during vaginal delivery will improve 04/09/2021 0235 by Thera Flake, RN Outcome: Completed/Met 04/08/2021 1934 by Thera Flake, RN Outcome: Progressing   Problem: Role Relationship: Goal: Will demonstrate positive interactions with the child 04/09/2021 0235 by Thera Flake, RN Outcome: Completed/Met 04/08/2021 1934 by Thera Flake, RN Outcome: Progressing   Problem: Safety: Goal: Risk of complications during labor and delivery will decrease 04/09/2021 0235 by Thera Flake, RN Outcome: Completed/Met 04/08/2021 1934 by Thera Flake, RN Outcome: Progressing   Problem: Pain Management: Goal: Relief or  control of pain from uterine contractions will improve 04/09/2021 0235 by Thera Flake, RN Outcome: Completed/Met 04/08/2021 1934 by Thera Flake, RN Outcome: Progressing

## 2021-04-09 NOTE — Progress Notes (Signed)
Dub Amis [phone] used for teaching

## 2021-04-09 NOTE — Lactation Note (Addendum)
This note was copied from a baby's chart. Lactation Consultation Note  Patient Name: Alexis Potts [id number] Date: 04/09/2021 Reason for consult: Follow-up assessment;Mother's request;Difficult latch;Term;Nipple pain/trauma;Breastfeeding assistance;Other (Comment) (CHTN (labetalol)) Age:34 hours Spanish interpreter Alexis Potts assisted with talking to parents.   LC assisted with getting infant to latch in football position with signs of milk transfer.  Mom nipple trauma tear along breast line from shallow latch. Mom to use coconut oil and EBM for nipple care.   Plan 1. To feed based on cues 8-12x 24hr period. Mom to offer breasts and look for signs of milk transfer.  2. Mom to supplement with pace bottle feeding and slow flow nipple EBM first followed by formula. Mom aware if infant not latching to offer more.  3. Mom to use manual pump q 3hrs for 10 min each breast.  All questions answered at the end of the visit.   Maternal Data Has patient been taught Hand Expression?: Yes Does the patient have breastfeeding experience prior to this delivery?: Yes  Feeding Mother's Current Feeding Choice: Breast Milk and Formula Nipple Type: Slow - flow  LATCH Score Latch: Repeated attempts needed to sustain latch, nipple held in mouth throughout feeding, stimulation needed to elicit sucking reflex.  Audible Swallowing: Spontaneous and intermittent  Type of Nipple: Everted at rest and after stimulation  Comfort (Breast/Nipple): Filling, red/small blisters or bruises, mild/mod discomfort  Hold (Positioning): Assistance needed to correctly position infant at breast and maintain latch.  LATCH Score: 7   Lactation Tools Discussed/Used Tools: Pump;Flanges Flange Size: 24 Breast pump type: Manual Pump Education: Setup, frequency, and cleaning;Milk Storage Reason for Pumping: increase stimulation Pumping frequency: every 3 hrs 10 min each breast  Interventions Interventions: Breast  feeding basics reviewed;Assisted with latch;Skin to skin;Breast massage;Hand express;Breast compression;Adjust position;Support pillows;Position options;Expressed milk;Coconut oil;Hand pump;Education;Pace feeding;LC Psychologist, educational;Infant Driven Feeding Algorithm education  Discharge Pump: Manual  Consult Status Consult Status: Follow-up Date: 04/10/21 Follow-up type: In-patient    Alexis Potts  Alexis Potts 04/09/2021, 5:59 PM

## 2021-04-10 ENCOUNTER — Other Ambulatory Visit (HOSPITAL_COMMUNITY): Payer: Self-pay

## 2021-04-10 MED ORDER — NIFEDIPINE ER 30 MG PO TB24
30.0000 mg | ORAL_TABLET | Freq: Every day | ORAL | 0 refills | Status: DC
Start: 2021-04-10 — End: 2021-05-08
  Filled 2021-04-10: qty 30, 30d supply, fill #0

## 2021-04-10 MED ORDER — IBUPROFEN 600 MG PO TABS
600.0000 mg | ORAL_TABLET | Freq: Four times a day (QID) | ORAL | 0 refills | Status: DC
  Filled 2021-04-10: qty 30, 8d supply, fill #0

## 2021-04-10 MED ORDER — NIFEDIPINE ER OSMOTIC RELEASE 30 MG PO TB24
30.0000 mg | ORAL_TABLET | Freq: Every day | ORAL | Status: DC
  Administered 2021-04-10: 10:00:00 30 mg via ORAL
  Filled 2021-04-10: qty 1

## 2021-04-10 MED ORDER — ACETAMINOPHEN 325 MG PO TABS
650.0000 mg | ORAL_TABLET | ORAL | 0 refills | Status: DC | PRN
  Filled 2021-04-10: qty 30, 3d supply, fill #0

## 2021-04-10 MED ORDER — NIFEDIPINE ER OSMOTIC RELEASE 30 MG PO TB24
30.0000 mg | ORAL_TABLET | Freq: Every day | ORAL | 0 refills | Status: DC
  Filled 2021-04-10: qty 30, 30d supply, fill #0

## 2021-04-10 MED ORDER — FUROSEMIDE 20 MG PO TABS
20.0000 mg | ORAL_TABLET | Freq: Every day | ORAL | 0 refills | Status: DC
  Filled 2021-04-10: qty 5, 5d supply, fill #0

## 2021-04-10 NOTE — Lactation Note (Addendum)
This note was copied from a baby's chart. Lactation Consultation Note  Patient Name: Boy Sharin Altidor [id number] Date: 04/10/2021 Reason for consult: Follow-up assessment;Mother's request;Difficult latch;Nipple pain/trauma;Breastfeeding assistance Age:34 hours  Spanish translator used in part of the interview but was lost midway. Mom able to verbalize all instructions provided.   Infant recent feeding prior to [facility] arrival and was resting comfortably. Mom stated nipple pain with both pump and latching. We reviewed different latch positions to get more depth on the breast.  Mom using coconut oil and EBM for nipple care. Mom abrasion yesterday that has healed today on left breast only.   LC increased flange size from 24 to 27 and milk began to flow. Mom aware that nipple size can change and may need to adjust accordingly. Mom stated 27 flange better fit.   Mom will reach out to [facility] for [facility] support. LC also recommended making outpatient LC appointment to work on latch and milk supply.  All questions answered in midst of the visit.   Maternal Data    Feeding Mother's Current Feeding Choice: Breast Milk and Formula  LATCH Score                    Lactation Tools Discussed/Used Tools: Flanges;Pump;Coconut oil Flange Size: 27 Breast pump type: Manual Pump Education: Setup, frequency, and cleaning;Milk Storage Reason for Pumping: increase stimulation Pumping frequency: every 3 hrs for 10 min each breast  Interventions Interventions: Breast feeding basics reviewed;Expressed milk;Coconut oil;Hand pump;Education;LC Psychologist, educational;Infant Driven Feeding Algorithm education  Discharge Discharge Education: Engorgement and breast care;Warning signs for feeding baby;Outpatient recommendation  Consult Status Consult Status: Follow-up Date: 04/11/21 Follow-up type: In-patient    Shamia Uppal  Nicholson-Springer 04/10/2021, 3:08 PM

## 2021-04-10 NOTE — Progress Notes (Signed)
CSW sent a email message to [facility] Counselor, Gardiner Coins to assist MOB with applying for Medicaid for infant.   CSW updated RN.   Blaine Hamper, MSW, LCSW Clinical Social Work [phone]

## 2021-04-16 ENCOUNTER — Other Ambulatory Visit: Payer: Self-pay

## 2021-04-16 ENCOUNTER — Ambulatory Visit: Payer: Self-pay

## 2021-04-16 ENCOUNTER — Ambulatory Visit (INDEPENDENT_AMBULATORY_CARE_PROVIDER_SITE_OTHER): Payer: Self-pay

## 2021-04-16 VITALS — BP 117/74 | HR 89 | Wt 149.6 lb

## 2021-04-16 DIAGNOSIS — Z0130 Encounter for examination of blood pressure without abnormal findings: Secondary | ICD-10-CM

## 2021-04-16 NOTE — Progress Notes (Signed)
Pt here today for BP check s/p start of Nifedipine 30 mg po daily.  With Spanish Interpreter Eda R., pt reports last dose was this am around 0730.  Pt denies blurry vision and/or headaches.  BP LA 129/90  Rpt BP check 117/85.  Pt advised to continue to take medication as prescribed, continue to monitor for sx's of elevated BP, and to call the office with questions/concerns.  Pt advised that we will evaluate her at her pp visit on 05/08/21 if she does not have any concerns prior.  Pt verbalized understanding with no further questions.   Leonette Nutting  04/16/21

## 2021-04-22 ENCOUNTER — Telehealth (HOSPITAL_COMMUNITY): Payer: Self-pay | Admitting: *Deleted

## 2021-04-22 NOTE — Telephone Encounter (Signed)
Interpreter says Mom reports feeling good. No concerns about herself at this time. EPDS=4 [facility] score=4) Mom reports baby is doing well. Feeding, peeing, and pooping without difficulty. Mom reports baby sleeps with her. She does have a crib, but baby doesn't sleep there as well. Safe sleep reviewed. Mom reports no concerns about baby at present.  Odis Hollingshead, RN 04-22-2021 at 2:35pm

## 2021-05-08 ENCOUNTER — Other Ambulatory Visit: Payer: Self-pay

## 2021-05-08 ENCOUNTER — Ambulatory Visit (INDEPENDENT_AMBULATORY_CARE_PROVIDER_SITE_OTHER): Payer: Self-pay | Admitting: Family Medicine

## 2021-05-08 ENCOUNTER — Encounter: Payer: Self-pay | Admitting: Family Medicine

## 2021-05-08 DIAGNOSIS — I10 Essential (primary) hypertension: Secondary | ICD-10-CM

## 2021-05-08 MED ORDER — NIFEDIPINE ER 30 MG PO TB24
30.0000 mg | ORAL_TABLET | Freq: Every day | ORAL | 5 refills | Status: DC
  Filled 2021-05-08: qty 30, 30d supply, fill #0

## 2021-05-08 NOTE — Progress Notes (Signed)
Post Partum Visit Note  Dajha Urquilla is a 36 y.o. [phone] female who presents for a postpartum visit. She is 5 weeks postpartum following a normal spontaneous vaginal delivery.  I have fully reviewed the prenatal and intrapartum course. The delivery was at [redacted]w[redacted]d gestational weeks.  Anesthesia:  1% lidocaine . Postpartum course has been uneventful. Baby is doing well. Baby is feeding by both breast and bottle - Similac . Bleeding no bleeding. Bowel function is normal. Bladder function is normal. Patient is not sexually active. Contraception method is none. Postpartum depression screening: negative.   The pregnancy intention screening data noted above was reviewed. Potential methods of contraception were discussed. The patient elected to proceed with No data recorded.   Edinburgh Postnatal Depression Scale - 05/08/21 0949       Edinburgh Postnatal Depression Scale:  In the Past 7 Days   I have been able to laugh and see the funny side of things. 0    I have looked forward with enjoyment to things. 0    I have blamed myself unnecessarily when things went wrong. 0    I have been anxious or worried for no good reason. 0    I have felt scared or panicky for no good reason. 0    Things have been getting on top of me. 1    I have been so unhappy that I have had difficulty sleeping. 0    I have felt sad or miserable. 0    I have been so unhappy that I have been crying. 0    The thought of harming myself has occurred to me. 0    Edinburgh Postnatal Depression Scale Total 1             Health Maintenance Due  Topic Date Due   COVID-19 Vaccine (3 - Booster for Pfizer series) 11/04/2019    The following portions of the patient's history were reviewed and updated as appropriate: allergies, current medications, past family history, past medical history, past social history, past surgical history, and problem list.  Review of Systems Pertinent items noted in HPI and remainder  of comprehensive ROS otherwise negative.  Objective:  BP 121/85    Pulse 73    Wt 147 lb (66.7 kg)    LMP 07/09/2020 (Exact Date)    Breastfeeding Yes    BMI 26.89 kg/m    General:  alert, cooperative, and appears stated age   Breasts:  not indicated  Lungs: Comfortable on room air  GU exam:  not indicated       Assessment:    There are no diagnoses linked to this encounter.  Normal postpartum exam.   Plan:   Essential components of care per ACOG recommendations:  1.  Mood and well being: Patient with negative depression screening today. Reviewed local resources for support.  - Patient tobacco use? No.   - hx of drug use? No.    2. Infant care and feeding:  -Patient currently breastmilk feeding? Yes. Reviewed importance of draining breast regularly to support lactation.  -Social determinants of health (SDOH) reviewed in EPIC. No concerns  3. Sexuality, contraception and birth spacing - Patient does not want a pregnancy in the next year.  Desired family size is 3 children.  - Reviewed forms of contraception in tiered fashion. Patient desired IUD today.  She is scheduled to see the health department for insertion soon.  - Discussed birth spacing of 18 months  4. Sleep  and fatigue -Encouraged family/partner/community support of 4 hrs of uninterrupted sleep to help with mood and fatigue  5. Physical Recovery  - Discussed patients delivery and complications. She describes her labor as good. - Patient had a Vaginal, no problems at delivery. Patient had a 1st degree laceration. Perineal healing reviewed. Patient expressed understanding - Patient has urinary incontinence? No. - Patient is safe to resume physical and sexual activity  6.  Health Maintenance - HM due items addressed Yes - Last pap smear  Diagnosis  Date Value Ref Range Status  03/26/2019   Final   - Negative for intraepithelial lesion or malignancy (NILM)   Pap smear not done at today's visit.  -Breast Cancer  screening indicated? No.   7. Chronic Disease/Pregnancy Condition follow up: Hypertension - PCP follow up for ongoing management of HTN  Venora Maples, MD [facility] for [facility], [facility]

## 2021-05-10 NOTE — Progress Notes (Signed)
Patient ID: Alexis Potts, female    DOB: 12/15/1985  MRN: [id number]  CC: Hypertension Follow-Up   Subjective: Alexis Potts is a 36 y.o. female who presents for hypertension follow-up.   Her concerns today include:  HYPERTENSION FOLLOW-UP: 05/08/2021 at [facility] for [facility] per MD note: Chronic Disease/Pregnancy Condition follow up: Hypertension - PCP follow up for ongoing management of HTN  05/13/2021: Doing well on current Nifedipine no issues/concerns. Requesting refills of prenatal vitamins. Requesting routine dental exam referral to [facility].    Patient Active Problem List   Diagnosis Date Noted   Vaginal delivery 04/09/2021   Pre-existing essential hypertension during pregnancy in third trimester 02/10/2021   Supervision of high risk pregnancy, antepartum 10/13/2020   Chronic hypertension 10/13/2020   Language barrier 08/13/2019     Current Outpatient Medications on File Prior to Visit  Medication Sig Dispense Refill   acetaminophen (TYLENOL) 325 MG tablet Take 2 tablets (650 mg total) by mouth every 4 (four) hours as needed (for pain scale < 4). 30 tablet 0   furosemide (LASIX) 20 MG tablet Take 1 tablet (20 mg total) by mouth daily for 5 days. 5 tablet 0   ibuprofen (ADVIL) 600 MG tablet Take 1 tablet (600 mg total) by mouth every 6 (six) hours. 30 tablet 0   No current facility-administered medications on file prior to visit.    No Known Allergies  Social History   Socioeconomic History   Marital status: Single    Spouse name: Not on file   Number of children: Not on file   Years of education: Not on file   Highest education level: Not on file  Occupational History   Not on file  Tobacco Use   Smoking status: Never   Smokeless tobacco: Never  Vaping Use   Vaping Use: Never used  Substance and Sexual Activity   Alcohol use: Not Currently   Drug use: Never   Sexual  activity: Yes    Birth control/protection: None  Other Topics Concern   Not on file  Social History Narrative   Not on file   Social Determinants of Health   Financial Resource Strain: Not on file  Food Insecurity: No Food Insecurity   Worried About Running Out of Food in the Last Year: Never true   Ran Out of Food in the Last Year: Never true  Transportation Needs: No Transportation Needs   Lack of Transportation (Medical): No   Lack of Transportation (Non-Medical): No  Physical Activity: Not on file  Stress: Not on file  Social Connections: Not on file  Intimate Partner Violence: Not At Risk   Fear of Current or Ex-Partner: No   Emotionally Abused: No   Physically Abused: No   Sexually Abused: No    Family History  Problem Relation Age of Onset   Diabetes Brother     Past Surgical History:  Procedure Laterality Date   NO PAST SURGERIES      ROS: Review of Systems Negative except as stated above  PHYSICAL EXAM: BP 122/83 (BP Location: Left Arm, Patient Position: Sitting, Cuff Size: Normal)    Pulse 62    Temp 98.5 F (36.9 C)    Resp 18    Ht 5' 1.77" (1.569 m)    Wt 145 lb (65.8 kg)    LMP 07/09/2020 (Exact Date)    SpO2 98%    Breastfeeding Yes    BMI 26.72  kg/m    Physical Exam HENT:     Head: Normocephalic and atraumatic.  Eyes:     Extraocular Movements: Extraocular movements intact.     Conjunctiva/sclera: Conjunctivae normal.     Pupils: Pupils are equal, round, and reactive to light.  Cardiovascular:     Rate and Rhythm: Normal rate and regular rhythm.     Pulses: Normal pulses.     Heart sounds: Normal heart sounds.  Pulmonary:     Effort: Pulmonary effort is normal.     Breath sounds: Normal breath sounds.  Musculoskeletal:     Cervical back: Normal range of motion and neck supple.  Neurological:     General: No focal deficit present.     Mental Status: She is alert and oriented to person, place, and time.  Psychiatric:        Mood and  Affect: Mood normal.        Behavior: Behavior normal.   ASSESSMENT AND PLAN: 1. Chronic hypertension: - Continue Nifedipine as prescribed.  - Counseled on blood pressure goal of less than 130/80, low-sodium, DASH diet, medication compliance, 150 minutes of moderate intensity exercise per week as tolerated. Discussed medication compliance, adverse effects. - Follow-up with primary provider in 3 months or sooner if needed.  - NIFEdipine (ADALAT CC) 30 MG 24 hr tablet; Take 1 tablet (30 mg total) by mouth daily.  Dispense: 90 tablet; Refill: 0  2. Postpartum care following vaginal delivery: - Continue Prenatal Vit-Fe Fumarate-FA as prescribed.  - Follow-up with primary provider as scheduled. - Prenatal Vit-Fe Fumarate-FA (PREPLUS) 27-1 MG TABS; Take 1 tablet by mouth daily.  Dispense: 30 tablet; Refill: 6  3. Encounter for routine dental examination: - Patient provided with dental resources for self pay. Patient declined.  - Per patient request referral to [facility] for further evaluation and management.  - Ambulatory referral to Dentistry  4. Language barrier: - [employer] participated during today's appointment. Interpreter Name: Quillian Quince, ID#: [id number].  Patient was given the opportunity to ask questions.  Patient verbalized understanding of the plan and was able to repeat key elements of the plan. Patient was given clear instructions to go to Emergency Department or return to medical center if symptoms don't improve, worsen, or new problems develop.The patient verbalized understanding.   Orders Placed This Encounter  Procedures   Ambulatory referral to Dentistry     Requested Prescriptions   Signed Prescriptions Disp Refills   Prenatal Vit-Fe Fumarate-FA (PREPLUS) 27-1 MG TABS 30 tablet 6    Sig: Take 1 tablet by mouth daily.   NIFEdipine (ADALAT CC) 30 MG 24 hr tablet 90 tablet 0    Sig: Take 1 tablet (30 mg total) by mouth daily.    Return in about 3 months  (around 08/11/2021) for Follow-Up or next available hypertension .  Camillia Herter, NP

## 2021-05-13 ENCOUNTER — Other Ambulatory Visit: Payer: Self-pay

## 2021-05-13 ENCOUNTER — Ambulatory Visit (INDEPENDENT_AMBULATORY_CARE_PROVIDER_SITE_OTHER): Payer: Self-pay | Admitting: Family

## 2021-05-13 ENCOUNTER — Encounter: Payer: Self-pay | Admitting: Family

## 2021-05-13 VITALS — BP 122/83 | HR 62 | Temp 98.5°F | Resp 18 | Ht 61.77 in | Wt 145.0 lb

## 2021-05-13 DIAGNOSIS — Z789 Other specified health status: Secondary | ICD-10-CM

## 2021-05-13 DIAGNOSIS — I10 Essential (primary) hypertension: Secondary | ICD-10-CM

## 2021-05-13 DIAGNOSIS — Z0120 Encounter for dental examination and cleaning without abnormal findings: Secondary | ICD-10-CM

## 2021-05-13 MED ORDER — NIFEDIPINE ER 30 MG PO TB24
30.0000 mg | ORAL_TABLET | Freq: Every day | ORAL | 0 refills | Status: DC
  Filled 2021-05-13: qty 30, 30d supply, fill #0
  Filled 2021-05-13: qty 90, 90d supply, fill #0
  Filled 2021-06-26: qty 60, 60d supply, fill #1

## 2021-05-13 MED ORDER — PREPLUS 27-1 MG PO TABS
1.0000 | ORAL_TABLET | Freq: Every day | ORAL | 6 refills | Status: DC
  Filled 2021-05-13: qty 30, 30d supply, fill #0

## 2021-05-13 NOTE — Patient Instructions (Addendum)
Nifedipine Oral Capsules Qu es este medicamento? El NIFEDIPINO es un bloqueador de los canales de calcio. Relaja los vasos sanguneos y reduce el trabajo que debe realizar el corazn. Se [place] para tratar y/o Estate agent en el pecho (tambin llamado angina de pecho). Este medicamento puede ser utilizado para otros usos; si tiene alguna pregunta consulte con su proveedor de atencin mdica o con su farmacutico. MARCAS COMUNES: Adalat, Procardia Qu le debo informar a mi profesional de la salud antes de tomar este medicamento? Necesitan saber si usted presenta alguno de los siguientes problemas o situaciones: ataque cardiaco enfermedad cardiaca insuficiencia cardiaca presin sangunea alta presin sangunea baja una reaccin alrgica o inusual al nifedipino, a otros frmacos, alimentos, colorantes o conservantes si est embarazada o buscando quedar embarazada si est amamantando a un beb Cmo debo utilizar este medicamento? Tome este frmaco por va oral. selo segn las instrucciones en la etiqueta a la misma hora todos [place]. Puede tomarlo con o sin alimentos. Si el Social worker, tmelo con alimentos. Siga usndolo a menos que su proveedor de Insurance risk surveyor indique dejar de [place]. No tome este frmaco con jugo de toronja (pomelo). Hable con su proveedor de atencin [employer] uso de este frmaco en nios. Puede requerir atencin especial. Sobredosis: Pngase en contacto inmediatamente con un centro toxicolgico o una sala de urgencia si usted cree que haya tomado demasiado medicamento. ATENCIN: [employer] es solo para usted. No comparta este medicamento con nadie. Qu sucede si me olvido de una dosis? Si olvida una dosis, adminstrela lo antes posible. Si es casi la hora de la prxima dosis, administre solo esa dosis. No se administre dosis adicionales o dobles. Qu puede interactuar con este medicamento? No use este medicamento  con ninguno de los siguientes frmacos: ciertos medicamentos para convulsiones, tales como carbamazepina, fenobarbital y Customer service manager; ivacaftor rifabutina rifampicina rifapentina hierba de [place] medicamento tambin puede Product/process development scientist con los siguientes frmacos: medicamentos antivirales para el VIH o SIDA ciertos medicamentos para la presin sangunea ciertos medicamentos para la diabetes ciertos medicamentos para la disfuncin erctil ciertos medicamentos para las infecciones micticas, tales como ketoconazol, fluconazol e itraconazol ciertos medicamentos para la frecuencia cardiaca irregular, tales como flecainida y quinidina ciertos medicamentos que tratan o previenen cogulos sanguneos, como warfarina claritromicina digoxina dolasetrn eritromicina fluoxetina jugo de toronja (pomelo) anestesia local o general nefazodona orlistat quinupristina; dalfopristina sirolims bloqueadores del cido del estmago, tales como cimetidina, ranitidina, omeprazol o pantoprazol tacrolims cido valproico Puede ser que esta lista no menciona todas las posibles interacciones. Informe a su profesional de [employer] de [employer] productos a base de hierbas, medicamentos de [place] o suplementos nutritivos que est tomando. Si usted fuma, consume bebidas alcohlicas o si utiliza drogas ilegales, indqueselo tambin a su profesional de [employer]. Algunas sustancias pueden interactuar con su medicamento. A qu debo estar atento al usar [employer]? Visite a su proveedor de atencin mdica para que revise regularmente su evolucin. Revise su presin sangunea como se le indique. Pregunte a su proveedor de atencin mdica cul debe ser su presin sangunea. Tambin averige cundo deber contactarlo. No se trate usted mismo si tiene tos, resfro o Scientist, research (physical sciences) est usando este medicamento sin consultar con su proveedor de Psychologist, prison and probation services. Algunos frmacos pueden aumentar su  presin sangunea. Puede experimentar somnolencia o mareos. No conduzca, no utilice maquinaria ni haga nada que Scientist, research (life sciences) en estado de alerta hasta que sepa cmo le afecta este  frmaco. No se siente ni se ponga de pie con rapidez, especialmente si es un paciente de edad avanzada. Esto reduce el riesgo de mareos o [employer]. Qu efectos secundarios puedo tener al [employer] este medicamento? Efectos secundarios que debe informar a su mdico o a su proveedor de atencin mdica tan pronto como sea posible: Therapist, art (erupcin cutnea, comezn/picazn o urticaria; hinchazn de la cara, los labios o Scientist, product/process development) ataque cardiaco (problemas para Industrial/product designer; Engineer, mining u opresin en el pecho, cuello, espalda o brazos; debilidad o cansancio inusuales) insuficiencia cardiaca (problemas para respirar; frecuencia cardiaca rpida, irregular; aumento repentino de peso; hinchazn de los tobillos, pies, manos; debilidad o cansancio inusuales) presin sangunea baja (mareos; sensacin de desmayo o aturdimiento; cadas; debilidad o cansancio inusuales) Efectos secundarios que generalmente no requieren atencin mdica (debe informarlos a su mdico o a su proveedor de atencin mdica si persisten o si son molestos): cambios en las emociones o el estado de nimo tos enrojecimiento del rostro dolor de cabeza Engineering geologist (sensacin de ardor en el pecho, generalmente despus de comer o al estar acostado) calambres musculares congestin nasal (tal como goteo nasal o nariz congestionada) nuseas temblores Puede ser que esta lista no menciona todos los posibles efectos secundarios. Comunquese a su mdico por asesoramiento mdico [employer]. Usted puede informar los efectos secundarios a la FDA por telfono al 1-800-FDA-1088. Dnde debo guardar mi medicina? Mantenga fuera del alcance de nios y Neurosurgeon. Guarde a [employer], entre 15 y 25 grados Celsius (59 y 36 grados Fahrenheit).  Proteja de la luz y la humedad. Mantenga el recipiente bien cerrado. Deseche todo el frmaco que no haya utilizado despus de la fecha de vencimiento. ATENCIN: Este folleto es un resumen. Puede ser que no cubra toda la posible informacin. Si usted tiene preguntas acerca de esta medicina, consulte con su mdico, su farmacutico o su profesional de Radiographer, therapeutic.  2022 Elsevier/Gold Standard (2019-08-09 00:00:00)

## 2021-05-13 NOTE — Progress Notes (Signed)
Pt presents for hypertension follow-up, Needs refill on prenatal vitamins

## 2021-05-14 ENCOUNTER — Other Ambulatory Visit: Payer: Self-pay

## 2021-06-26 ENCOUNTER — Other Ambulatory Visit: Payer: Self-pay

## 2021-08-07 ENCOUNTER — Telehealth: Payer: Self-pay | Admitting: Family

## 2021-08-07 NOTE — Progress Notes (Signed)
? ? ?Patient ID: Alexis Potts, female    DOB: 06/18/1985  MRN: [id number] ? ?CC: Hypertension Follow-Up ? ?Subjective: ?Alexis Potts is a 36 y.o. female who presents for hypertension follow-up.  ? ?Her concerns today include:  ?HYPERTENSION FOLLOW-UP: ?05/13/2021: ?- Continue Nifedipine as prescribed.  ? ?08/12/2021: ?Doing well on current regimen. No side effects. No issues/concerns. Denies chest pain, shortness of breath, worst headache of life and additional red flag symptoms. Not checking blood pressures at home. Eating more fruits and vegetables. She is no longer breastfeeding.  ? ? ?Patient Active Problem List  ? Diagnosis Date Noted  ? Vaginal delivery 04/09/2021  ? Pre-existing essential hypertension during pregnancy in third trimester 02/10/2021  ? Supervision of high risk pregnancy, antepartum 10/13/2020  ? Chronic hypertension 10/13/2020  ? Language barrier 08/13/2019  ?  ? ?Current Outpatient Medications on File Prior to Visit  ?Medication Sig Dispense Refill  ? acetaminophen (TYLENOL) 325 MG tablet Take 2 tablets (650 mg total) by mouth every 4 (four) hours as needed (for pain scale < 4). 30 tablet 0  ? furosemide (LASIX) 20 MG tablet Take 1 tablet (20 mg total) by mouth daily for 5 days. 5 tablet 0  ? ibuprofen (ADVIL) 600 MG tablet Take 1 tablet (600 mg total) by mouth every 6 (six) hours. 30 tablet 0  ? Prenatal Vit-Fe Fumarate-FA (PREPLUS) 27-1 MG TABS Take 1 tablet by mouth daily. 30 tablet 6  ? ?No current facility-administered medications on file prior to visit.  ? ? ?No Known Allergies ? ?Social History  ? ?Socioeconomic History  ? Marital status: Single  ?  Spouse name: Not on file  ? Number of children: Not on file  ? Years of education: Not on file  ? Highest education level: Not on file  ?Occupational History  ? Not on file  ?Tobacco Use  ? Smoking status: Never  ?  Passive exposure: Never  ? Smokeless tobacco: Never  ?Vaping Use  ? Vaping Use: Never used  ?Substance  and Sexual Activity  ? Alcohol use: Not Currently  ? Drug use: Never  ? Sexual activity: Yes  ?  Birth control/protection: None  ?Other Topics Concern  ? Not on file  ?Social History Narrative  ? Not on file  ? ?Social Determinants of Health  ? ?Financial Resource Strain: Not on file  ?Food Insecurity: No Food Insecurity  ? Worried About Charity fundraiser in the Last Year: Never true  ? Ran Out of Food in the Last Year: Never true  ?Transportation Needs: No Transportation Needs  ? Lack of Transportation (Medical): No  ? Lack of Transportation (Non-Medical): No  ?Physical Activity: Not on file  ?Stress: Not on file  ?Social Connections: Not on file  ?Intimate Partner Violence: Not At Risk  ? Fear of Current or Ex-Partner: No  ? Emotionally Abused: No  ? Physically Abused: No  ? Sexually Abused: No  ? ? ?Family History  ?Problem Relation Age of Onset  ? Diabetes Brother   ? ? ?Past Surgical History:  ?Procedure Laterality Date  ? NO PAST SURGERIES    ? ? ?ROS: ?Review of Systems ?Negative except as stated above ? ?PHYSICAL EXAM: ?BP 130/85 (BP Location: Left Arm, Patient Position: Sitting, Cuff Size: Normal)   Pulse 72   Temp 98.3 ?F (36.8 ?C)   Resp 18   Ht 5' 1.77" (1.569 m)   Wt 141 lb (64 kg)   SpO2 98%  Breastfeeding No   BMI 25.98 kg/m?  ? ?Physical Exam ?HENT:  ?   Head: Normocephalic and atraumatic.  ?Eyes:  ?   Extraocular Movements: Extraocular movements intact.  ?   Conjunctiva/sclera: Conjunctivae normal.  ?   Pupils: Pupils are equal, round, and reactive to light.  ?Cardiovascular:  ?   Rate and Rhythm: Normal rate and regular rhythm.  ?   Pulses: Normal pulses.  ?   Heart sounds: Normal heart sounds.  ?Pulmonary:  ?   Effort: Pulmonary effort is normal.  ?   Breath sounds: Normal breath sounds.  ?Musculoskeletal:  ?   Cervical back: Normal range of motion and neck supple.  ?Neurological:  ?   General: No focal deficit present.  ?   Mental Status: She is alert and oriented to person, place, and  time.  ?Psychiatric:     ?   Mood and Affect: Mood normal.     ?   Behavior: Behavior normal.  ?  ? ?ASSESSMENT AND PLAN: ?1. Essential (primary) hypertension: ?- Continue Nifedipine as prescribed.  ?- Patient reports no longer breastfeeding. ?- Counseled on blood pressure goal of less than 130/80, low-sodium, DASH diet, medication compliance, and 150 minutes of moderate intensity exercise per week as tolerated. Counseled on medication adherence and adverse effects. ?- Update BMP. ?- Follow-up with primary provider in 4 months or sooner if needed. ?- Basic Metabolic Panel ?- NIFEdipine (ADALAT CC) 30 MG 24 hr tablet; Take 1 tablet (30 mg total) by mouth daily.  Dispense: 120 tablet; Refill: 0 ? ?2. Language barrier: ?- [employer] participated during today's appointment. Interpreter Name: [place], [place]: [id number]. ? ? ?Patient was given the opportunity to ask questions.  Patient verbalized understanding of the plan and was able to repeat key elements of the plan. Patient was given clear instructions to go to Emergency Department or return to medical center if symptoms don't improve, worsen, or new problems develop.The patient verbalized understanding. ? ? ?Orders Placed This Encounter  ?Procedures  ? Basic Metabolic Panel  ? ? ? ?Requested Prescriptions  ? ?Signed Prescriptions Disp Refills  ? NIFEdipine (ADALAT CC) 30 MG 24 hr tablet 120 tablet 0  ?  Sig: Take 1 tablet (30 mg total) by mouth daily.  ? ? ?Return in about 4 months (around 12/12/2021) for Follow-Up or next available HTN. ? ?Alexis Herter, NP  ?

## 2021-08-07 NOTE — Telephone Encounter (Signed)
LVM to inform pt can stop by to pick up Financial Assistance application at [facility] office M-F 8-5 with the exception of 12-1 (close for lunch) if pt still needing this. Pt would then need to mail this packet to the address listed on the 1st page of packet. Thank you.  ?

## 2021-08-12 ENCOUNTER — Encounter: Payer: Self-pay | Admitting: Family

## 2021-08-12 ENCOUNTER — Ambulatory Visit (INDEPENDENT_AMBULATORY_CARE_PROVIDER_SITE_OTHER): Payer: Self-pay | Admitting: Family

## 2021-08-12 ENCOUNTER — Other Ambulatory Visit: Payer: Self-pay

## 2021-08-12 VITALS — BP 130/85 | HR 72 | Temp 98.3°F | Resp 18 | Ht 61.77 in | Wt 141.0 lb

## 2021-08-12 DIAGNOSIS — I10 Essential (primary) hypertension: Secondary | ICD-10-CM

## 2021-08-12 DIAGNOSIS — Z789 Other specified health status: Secondary | ICD-10-CM

## 2021-08-12 MED ORDER — NIFEDIPINE ER 30 MG PO TB24
30.0000 mg | ORAL_TABLET | Freq: Every day | ORAL | 0 refills | Status: DC
  Filled 2021-08-12: qty 90, 90d supply, fill #0

## 2021-08-12 NOTE — Progress Notes (Signed)
Pt presents for hypertension f/u, pt states she is no longer breastfeeding ?Needs refill on Nifedipine  ? ?

## 2021-08-13 ENCOUNTER — Other Ambulatory Visit: Payer: Self-pay

## 2021-08-13 LAB — BASIC METABOLIC PANEL
BUN/Creatinine Ratio: 15 (ref 9–23)
BUN: 12 mg/dL (ref 6–20)
CO2: 23 mmol/L (ref 20–29)
Calcium: 9.5 mg/dL (ref 8.7–10.2)
Chloride: 105 mmol/L (ref 96–106)
Creatinine, Ser: 0.78 mg/dL (ref 0.57–1.00)
Glucose: 89 mg/dL (ref 70–99)
Potassium: 4.2 mmol/L (ref 3.5–5.2)
Sodium: 138 mmol/L (ref 134–144)
eGFR: 102 mL/min/{1.73_m2} (ref >59)

## 2021-08-13 NOTE — Progress Notes (Signed)
Call patient with update.  ? ?Kidney function and electrolytes normal.

## 2021-09-27 IMAGING — US US MFM OB DETAIL+14 WK
1 series · 13 of 28 positions shown · non-contrast
Comparison: none

[Series 1: us mfm ob detail+14 wk · 13 of 73 slices shown]
[im 3/73]
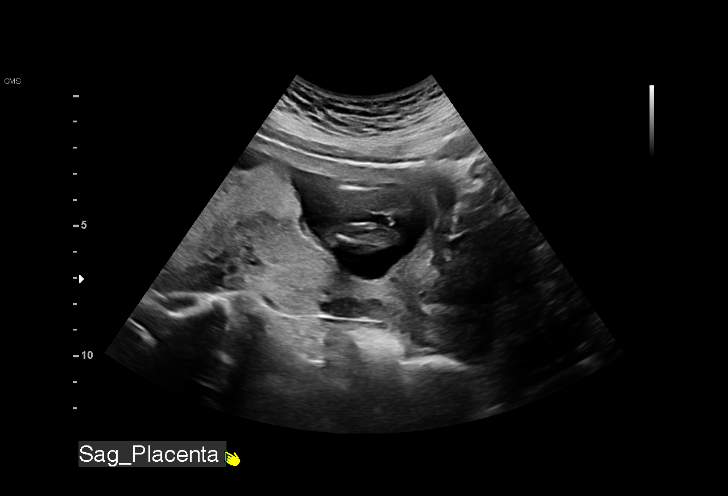
[im 9/73]
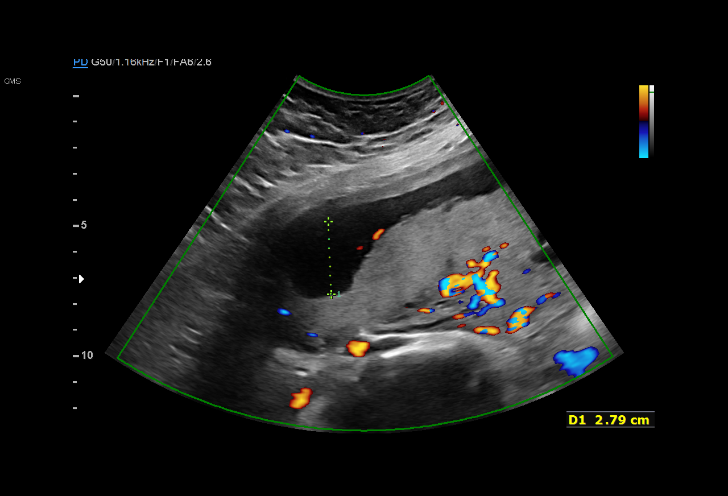
[im 14/73]
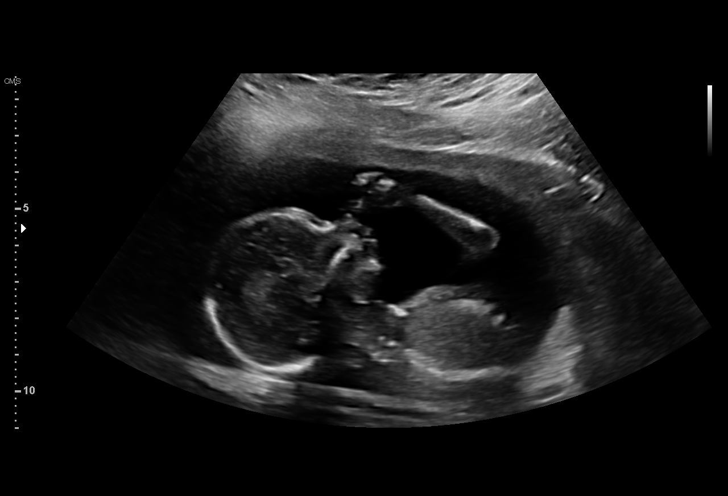
[im 19/73]
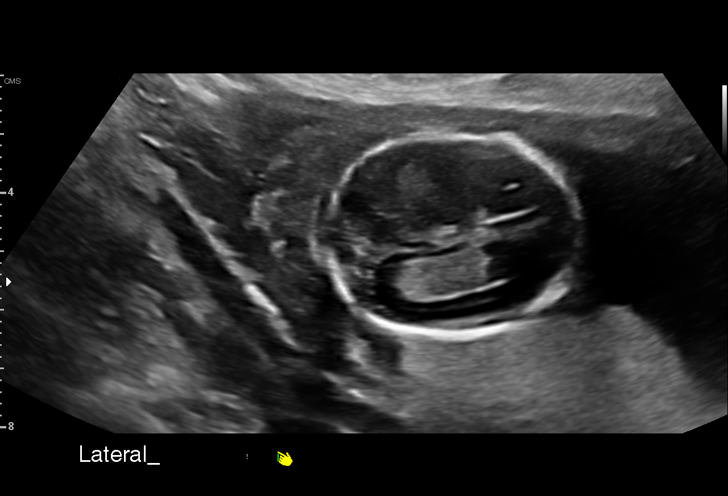
[im 25/73]
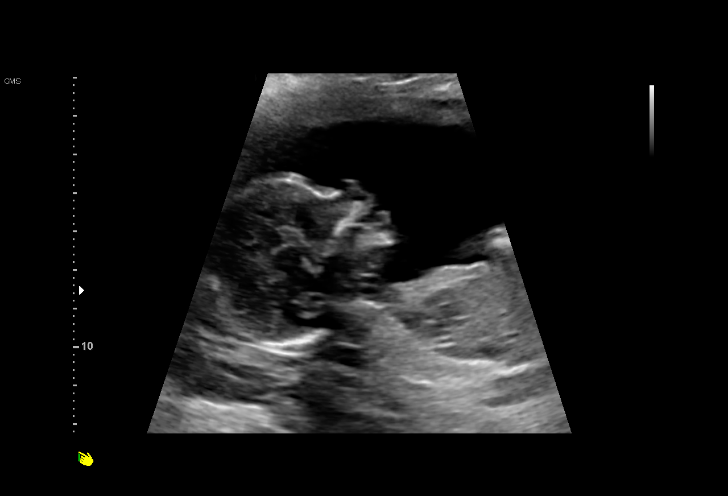
[im 30/73]
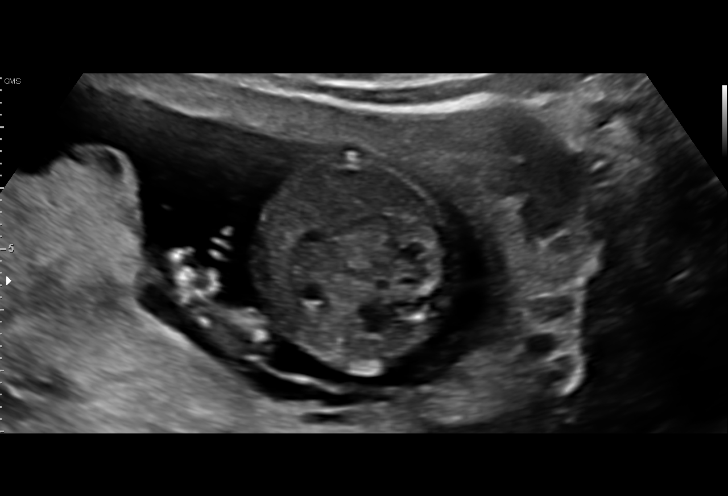
[im 38/73]
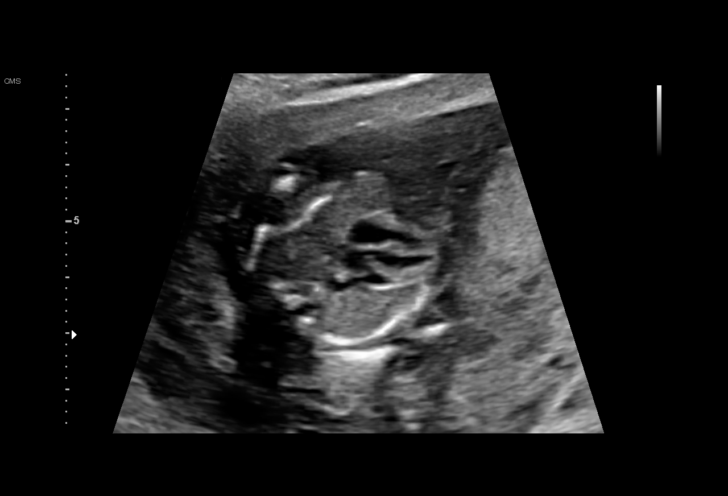
[im 43/73]
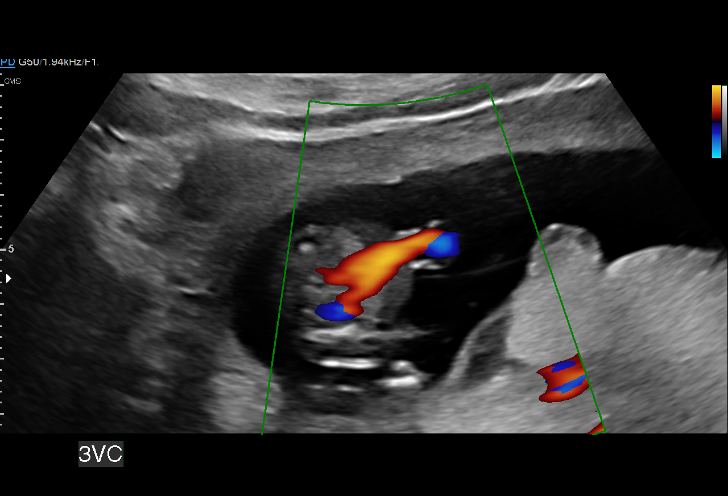
[im 49/73]
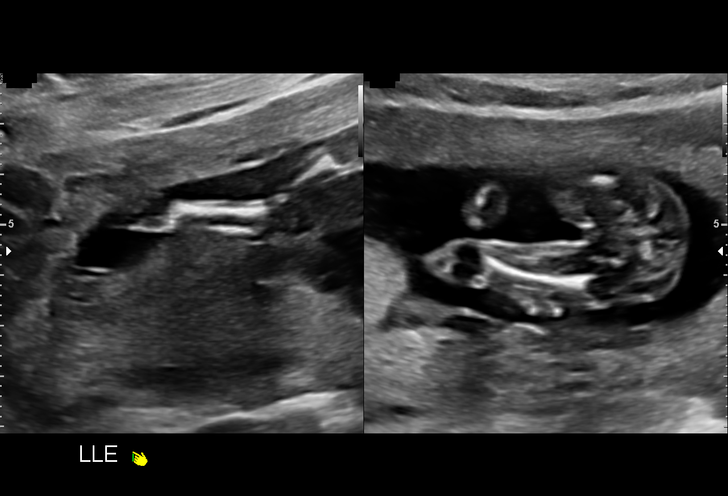
[im 54/73]
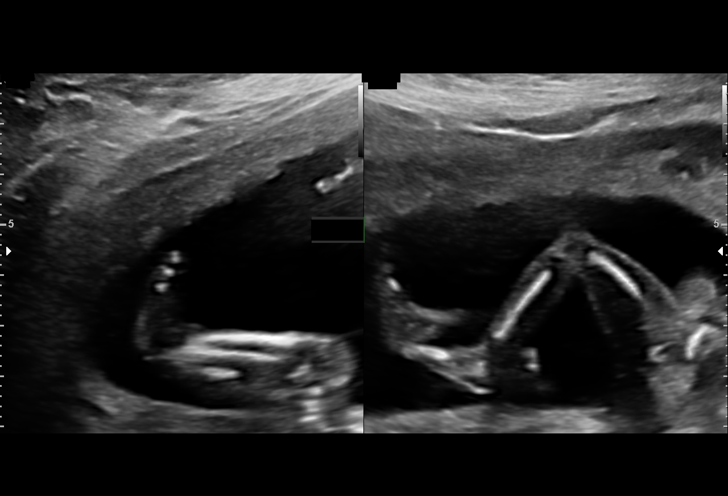
[im 59/73]
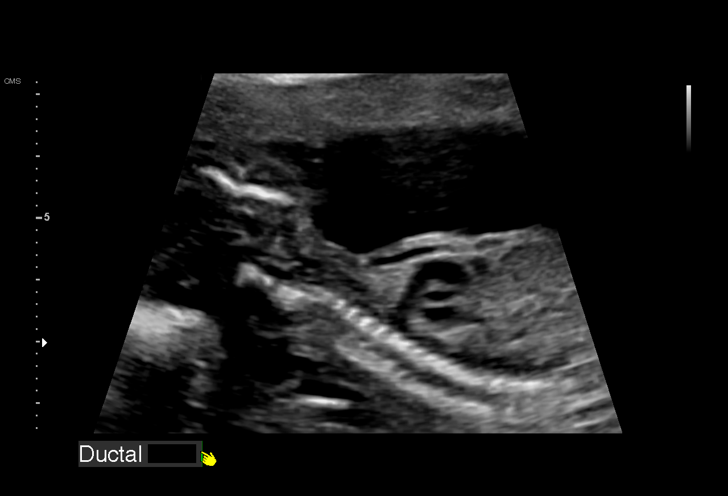
[im 65/73]
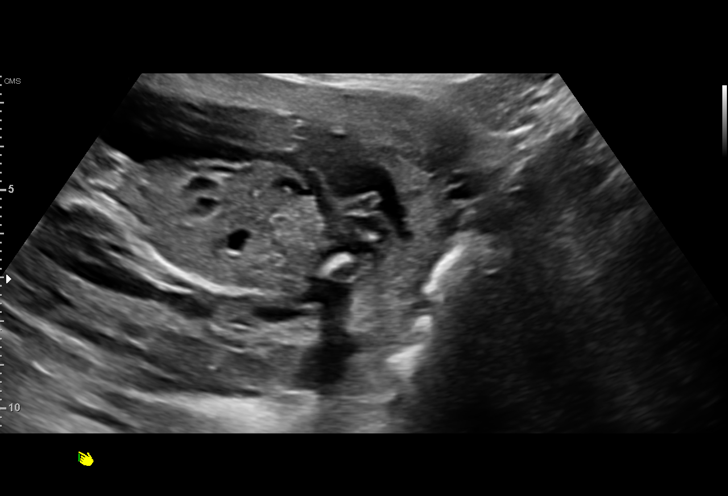
[im 70/73]
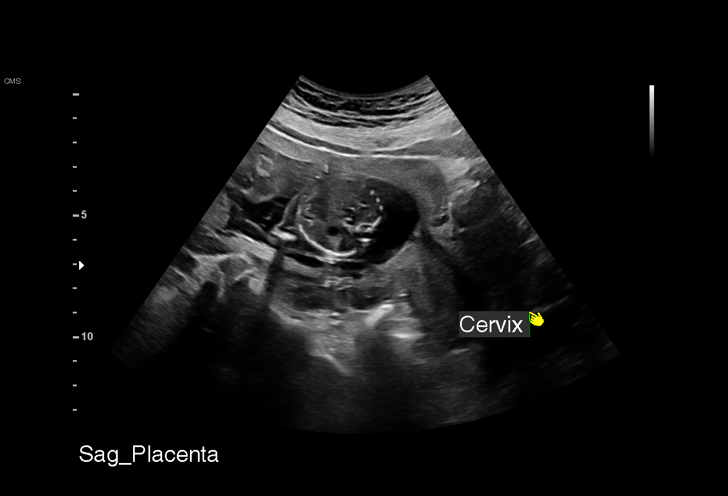

[13 of 28 positions shown; findings below may reference images not displayed]

[facility]

                                                            [address]

 ----------------------------------------------------------------------

 ----------------------------------------------------------------------
Indications

  Antenatal screening for malformations
  Encounter for uncertain dates
  Hypertension - Chronic/Pre-existing (no
  meds)
  16 weeks gestation of pregnancy
 ----------------------------------------------------------------------
Fetal Evaluation

 Num Of Fetuses:         1
 Fetal Heart Rate(bpm):  149
 Cardiac Activity:       Observed
 Presentation:           Breech
 Placenta:               Posterior
 P. Cord Insertion:      Visualized

 Amniotic Fluid
 AFI FV:      Within normal limits

                             Largest Pocket(cm)

Biometry

 BPD:      33.9  mm     G. Age:  16w 3d         44  %    CI:         68.7   %    70 - 86
                                                         FL/HC:      16.1   %    14.6 -
 HC:      130.7  mm     G. Age:  16w 5d         44  %    HC/AC:      1.18        1.07 -
 AC:      110.7  mm     G. Age:  16w 6d         63  %    FL/BPD:     61.9   %
 FL:         21  mm     G. Age:  16w 2d         33  %    FL/AC:      19.0   %    20 - 24
 CER:      16.2  mm     G. Age:  16w 1d         42  %
 NFT:       4.2  mm
 CM:        4.5  mm

 Est. FW:     163  gm      0 lb 6 oz     46  %
OB History

 Gravidity:    3         Term:   1        Prem:   0        SAB:   1
 TOP:          0       Ectopic:  0        Living: 1
Gestational Age

 LMP:           16w 2d        Date:  12/30/18                 EDD:   10/06/19
 U/S Today:     16w 4d                                        EDD:   10/04/19
 Best:          16w 4d     Det. By:  U/S (04/23/19)           EDD:   10/04/19
Anatomy

 Cranium:               Appears normal         LVOT:                   Appears normal
 Cavum:                 Appears normal         Aortic Arch:            Appears normal
 Ventricles:            Appears normal         Ductal Arch:            Appears normal
 Choroid Plexus:        Appears normal         Diaphragm:              Not well visualized
 Cerebellum:            Appears normal         Stomach:                Appears normal, left
                                                                       sided
 Posterior Fossa:       Appears normal         Abdomen:                Appears normal
 Nuchal Fold:           Appears normal         Abdominal Wall:         Appears nml (cord
                                                                       insert, abd wall)
 Face:                  Appears normal         Cord Vessels:           Appears normal (3
                        (orbits and profile)                           vessel cord)
 Lips:                  Not well visualized    Kidneys:                Appear normal
 Palate:                Not well visualized    Bladder:                Appears normal
 Thoracic:              Appears normal         Spine:                  Not well visualized
 Heart:                 Appears normal         Upper Extremities:      Appears normal
                        (4CH, axis, and
                        situs)
 RVOT:                  Not well visualized    Lower Extremities:      Appears normal

 Other:  Heels/feet and open hands/5th digits visualized. Nasal bone
         visualized. Technically difficult due to early gestational age.
Cervix Uterus Adnexa

 Cervix
 Length:            3.6  cm.
 Normal appearance by transabdominal scan.

 Uterus
 No abnormality visualized.

 Left Ovary
 Within normal limits.

 Right Ovary
 Not visualized.

 Cul De Sac
 No free fluid seen.

 Adnexa
 No abnormality visualized.
Impression

 G3 [id number]. An emergency ultrasound was requested because
 of inability to find fetal heart activity on clinical evaluation at
 your office. Patient is unsure of her LMP date.

 Obstetric history is significant for a term vaginal delivery in
 7881of a female infant weighing 3,317 g at birth. In addition,
 she had one early SAB ([DATE]).

 Patient has chronic hypertension and takes amlodipine for
 control.

 BP at our office today is 114/77 mm Hg.

 On cell-free fetal DNA screening, the risks of fetal
 aneuploidies are not increased.

 We performed a fetal anatomy scan. Amniotic fluid is normal
 and good fetal activity is seen. No markers of aneuploidies or
 fetal structural defects are seen.Fetal anatomical survey is
 limited because of early gestational age. Fetal biometry is
 consistent with 16w 4d gestation. Patient understands the
 limitations of ultrasound in detecting fetal anomalies.
 We have assigned her EDD at 10/04/2019.
Recommendations

 -An appointment was made for her to return in 4 weeks for
 completion of fetal anatomy.
 -Fetal growth assessment every 4 weeks.
                 Fir, Mow

## 2021-11-30 NOTE — Progress Notes (Deleted)
Patient ID: Alexis Potts, female    DOB: January 16, 1986  MRN: [id number]  CC: Chronic Care Management  Subjective: Alexis Potts is a 36 y.o. female who presents for chronic care management.   Her concerns today include: HTN - Continue Nifedipine as prescribed.   DM screening  Patient Active Problem List   Diagnosis Date Noted   Vaginal delivery 04/09/2021   Pre-existing essential hypertension during pregnancy in third trimester 02/10/2021   Supervision of high risk pregnancy, antepartum 10/13/2020   Chronic hypertension 10/13/2020   Language barrier 08/13/2019     Current Outpatient Medications on File Prior to Visit  Medication Sig Dispense Refill   acetaminophen (TYLENOL) 325 MG tablet Take 2 tablets (650 mg total) by mouth every 4 (four) hours as needed (for pain scale < 4). 30 tablet 0   furosemide (LASIX) 20 MG tablet Take 1 tablet (20 mg total) by mouth daily for 5 days. 5 tablet 0   ibuprofen (ADVIL) 600 MG tablet Take 1 tablet (600 mg total) by mouth every 6 (six) hours. 30 tablet 0   NIFEdipine (ADALAT CC) 30 MG 24 hr tablet Take 1 tablet (30 mg total) by mouth daily. 120 tablet 0   Prenatal Vit-Fe Fumarate-FA (PREPLUS) 27-1 MG TABS Take 1 tablet by mouth daily. 30 tablet 6   No current facility-administered medications on file prior to visit.    No Known Allergies  Social History   Socioeconomic History   Marital status: Single    Spouse name: Not on file   Number of children: Not on file   Years of education: Not on file   Highest education level: Not on file  Occupational History   Not on file  Tobacco Use   Smoking status: Never    Passive exposure: Never   Smokeless tobacco: Never  Vaping Use   Vaping Use: Never used  Substance and Sexual Activity   Alcohol use: Not Currently   Drug use: Never   Sexual activity: Yes    Birth control/protection: None  Other Topics Concern   Not on file  Social History Narrative   Not on  file   Social Determinants of Health   Financial Resource Strain: Not on file  Food Insecurity: No Food Insecurity (12/12/2020)   Hunger Vital Sign    Worried About Running Out of Food in the Last Year: Never true    Ran Out of Food in the Last Year: Never true  Transportation Needs: No Transportation Needs (12/12/2020)   PRAPARE - Administrator, Civil Service (Medical): No    Lack of Transportation (Non-Medical): No  Physical Activity: Not on file  Stress: Not on file  Social Connections: Not on file  Intimate Partner Violence: Not At Risk (10/13/2020)   Humiliation, Afraid, Rape, and Kick questionnaire    Fear of Current or Ex-Partner: No    Emotionally Abused: No    Physically Abused: No    Sexually Abused: No    Family History  Problem Relation Age of Onset   Diabetes Brother     Past Surgical History:  Procedure Laterality Date   NO PAST SURGERIES      ROS: Review of Systems Negative except as stated above  PHYSICAL EXAM: There were no vitals taken for this visit.  Physical Exam  {female adult master:310786} {female adult master:310785}     Latest Ref Rng & Units 08/12/2021    4:04 PM 04/08/2021   10:29 AM 10/13/2020  10:14 AM  CMP  Glucose 70 - 99 mg/dL 89  84  86   BUN 6 - 20 mg/dL 12  6  5    Creatinine 0.57 - 1.00 mg/dL  3.22  0.25   Sodium 134 - 144 mmol/L 138  132  136   Potassium 3.5 - 5.2 mmol/L 4.2  4.1  3.9   Chloride 96 - 106 mmol/L 105  107  103   CO2 20 - 29 mmol/L 23  18  18    Calcium 8.7 - 10.2 mg/dL 9.5  8.9  9.0   Total Protein 6.5 - 8.1 g/dL  6.5  6.7   Total Bilirubin 0.3 - 1.2 mg/dL  0.4  0.3   Alkaline Phos 38 - 126 U/L  153  48   AST 15 - 41 U/L  29  13   ALT 0 - 44 U/L  26  16    Lipid Panel     Component Value Date/Time   CHOL 219 (H) 06/23/2020 0942   TRIG 169 (H) 06/23/2020 0942   HDL 48 06/23/2020 0942   CHOLHDL 4.6 (H) 06/23/2020 0942   LDLCALC 141 (H) 06/23/2020 0942    CBC    Component Value  Date/Time   WBC 5.9 04/08/2021 1029   RBC 4.30 04/08/2021 1029   HGB 13.5 04/08/2021 1029   HGB 12.5 01/12/2021 1056   HCT 39.4 04/08/2021 1029   HCT 36.4 01/12/2021 1056   PLT 218 04/08/2021 1029   PLT 235 01/12/2021 1056   MCV 91.6 04/08/2021 1029   MCV 92 01/12/2021 1056   MCH 31.4 04/08/2021 1029   MCHC 34.3 04/08/2021 1029   RDW 13.9 04/08/2021 1029   RDW 13.1 01/12/2021 1056   LYMPHSABS 1.6 10/13/2020 1014   EOSABS 0.1 10/13/2020 1014   BASOSABS 0.0 10/13/2020 1014    ASSESSMENT AND PLAN:  There are no diagnoses linked to this encounter.   Patient was given the opportunity to ask questions.  Patient verbalized understanding of the plan and was able to repeat key elements of the plan. Patient was given clear instructions to go to Emergency Department or return to medical center if symptoms don't improve, worsen, or new problems develop.The patient verbalized understanding.   No orders of the defined types were placed in this encounter.    Requested Prescriptions    No prescriptions requested or ordered in this encounter    No follow-ups on file.  10/15/2020, NP

## 2021-12-01 NOTE — Progress Notes (Unsigned)
Patient ID: Alexis Potts, female    DOB: [birth year]  MRN: [id number]  CC: Chronic Care Management   Subjective: Alexis Potts is a 36 y.o. female who presents for chronic care management.   Her concerns today include:  HTN - Nifedipine   Patient Active Problem List   Diagnosis Date Noted   Vaginal delivery 04/09/2021   Pre-existing essential hypertension during pregnancy in third trimester 02/10/2021   Supervision of high risk pregnancy, antepartum 10/13/2020   Chronic hypertension 10/13/2020   Language barrier 08/13/2019     Current Outpatient Medications on File Prior to Visit  Medication Sig Dispense Refill   acetaminophen (TYLENOL) 325 MG tablet Take 2 tablets (650 mg total) by mouth every 4 (four) hours as needed (for pain scale < 4). 30 tablet 0   furosemide (LASIX) 20 MG tablet Take 1 tablet (20 mg total) by mouth daily for 5 days. 5 tablet 0   ibuprofen (ADVIL) 600 MG tablet Take 1 tablet (600 mg total) by mouth every 6 (six) hours. 30 tablet 0   NIFEdipine (ADALAT CC) 30 MG 24 hr tablet Take 1 tablet (30 mg total) by mouth daily. 120 tablet 0   Prenatal Vit-Fe Fumarate-FA (PREPLUS) 27-1 MG TABS Take 1 tablet by mouth daily. 30 tablet 6   No current facility-administered medications on file prior to visit.    No Known Allergies  Social History   Socioeconomic History   Marital status: Single    Spouse name: Not on file   Number of children: Not on file   Years of education: Not on file   Highest education level: Not on file  Occupational History   Not on file  Tobacco Use   Smoking status: Never    Passive exposure: Never   Smokeless tobacco: Never  Vaping Use   Vaping Use: Never used  Substance and Sexual Activity   Alcohol use: Not Currently   Drug use: Never   Sexual activity: Yes    Birth control/protection: None  Other Topics Concern   Not on file  Social History Narrative   Not on file   Social Determinants of  Health   Financial Resource Strain: Not on file  Food Insecurity: No Food Insecurity (12/12/2020)   Hunger Vital Sign    Worried About Running Out of Food in the Last Year: Never true    Ran Out of Food in the Last Year: Never true  Transportation Needs: No Transportation Needs (12/12/2020)   PRAPARE - Administrator, Civil Service (Medical): No    Lack of Transportation (Non-Medical): No  Physical Activity: Not on file  Stress: Not on file  Social Connections: Not on file  Intimate Partner Violence: Not At Risk (10/13/2020)   Humiliation, Afraid, Rape, and Kick questionnaire    Fear of Current or Ex-Partner: No    Emotionally Abused: No    Physically Abused: No    Sexually Abused: No    Family History  Problem Relation Age of Onset   Diabetes Brother     Past Surgical History:  Procedure Laterality Date   NO PAST SURGERIES      ROS: Review of Systems Negative except as stated above  PHYSICAL EXAM: There were no vitals taken for this visit.  Physical Exam  {female adult master:310786} {female adult master:310785}     Latest Ref Rng & Units 08/12/2021    4:04 PM 04/08/2021   10:29 AM 10/13/2020   10:14 AM  CMP  Glucose 70 - 99 mg/dL 89  84  86   BUN 6 - 20 mg/dL 12  6  5    Creatinine 0.57 - 1.00 mg/dL  0.26  3.78   Sodium 134 - 144 mmol/L 138  132  136   Potassium 3.5 - 5.2 mmol/L 4.2  4.1  3.9   Chloride 96 - 106 mmol/L 105  107  103   CO2 20 - 29 mmol/L 23  18  18    Calcium 8.7 - 10.2 mg/dL 9.5  8.9  9.0   Total Protein 6.5 - 8.1 g/dL  6.5  6.7   Total Bilirubin 0.3 - 1.2 mg/dL  0.4  0.3   Alkaline Phos 38 - 126 U/L  153  48   AST 15 - 41 U/L  29  13   ALT 0 - 44 U/L  26  16    Lipid Panel     Component Value Date/Time   CHOL 219 (H) 06/23/2020 0942   TRIG 169 (H) 06/23/2020 0942   HDL 48 06/23/2020 0942   CHOLHDL 4.6 (H) 06/23/2020 0942   LDLCALC 141 (H) 06/23/2020 0942    CBC    Component Value Date/Time   WBC 5.9 04/08/2021  1029   RBC 4.30 04/08/2021 1029   HGB 13.5 04/08/2021 1029   HGB 12.5 01/12/2021 1056   HCT 39.4 04/08/2021 1029   HCT 36.4 01/12/2021 1056   PLT 218 04/08/2021 1029   PLT 235 01/12/2021 1056   MCV 91.6 04/08/2021 1029   MCV 92 01/12/2021 1056   MCH 31.4 04/08/2021 1029   MCHC 34.3 04/08/2021 1029   RDW 13.9 04/08/2021 1029   RDW 13.1 01/12/2021 1056   LYMPHSABS 1.6 10/13/2020 1014   EOSABS 0.1 10/13/2020 1014   BASOSABS 0.0 10/13/2020 1014    ASSESSMENT AND PLAN:  There are no diagnoses linked to this encounter.   Patient was given the opportunity to ask questions.  Patient verbalized understanding of the plan and was able to repeat key elements of the plan. Patient was given clear instructions to go to Emergency Department or return to medical center if symptoms don't improve, worsen, or new problems develop.The patient verbalized understanding.   No orders of the defined types were placed in this encounter.    Requested Prescriptions    No prescriptions requested or ordered in this encounter    No follow-ups on file.  10/15/2020, NP

## 2021-12-02 ENCOUNTER — Ambulatory Visit (INDEPENDENT_AMBULATORY_CARE_PROVIDER_SITE_OTHER): Payer: Self-pay | Admitting: Family

## 2021-12-02 ENCOUNTER — Other Ambulatory Visit: Payer: Self-pay

## 2021-12-02 VITALS — BP 116/80 | HR 73 | Temp 98.3°F | Resp 16 | Ht 61.77 in | Wt 150.0 lb

## 2021-12-02 DIAGNOSIS — I10 Essential (primary) hypertension: Secondary | ICD-10-CM

## 2021-12-02 DIAGNOSIS — Z603 Acculturation difficulty: Secondary | ICD-10-CM

## 2021-12-02 DIAGNOSIS — Z131 Encounter for screening for diabetes mellitus: Secondary | ICD-10-CM

## 2021-12-02 DIAGNOSIS — Z789 Other specified health status: Secondary | ICD-10-CM

## 2021-12-02 LAB — POCT GLYCOSYLATED HEMOGLOBIN (HGB A1C): Hemoglobin A1C: 5.3 % (ref 4.0–5.6)

## 2021-12-02 MED ORDER — NIFEDIPINE ER 30 MG PO TB24
30.0000 mg | ORAL_TABLET | Freq: Every day | ORAL | 5 refills | Status: DC
  Filled 2021-12-02 – 2021-12-04 (×2): qty 30, 30d supply, fill #0
  Filled 2022-01-07: qty 90, 90d supply, fill #1

## 2021-12-02 NOTE — Patient Instructions (Signed)
Nifedipine Capsules Qu es este medicamento? El NIFEDIPINO previene y trata el dolor en el pecho (angina de pecho). Acta relajando los vasos sanguneos y as se reduce el trabajo que debe realizar el corazn. Pertenece a un grupo de medicamentos llamados bloqueadores de los canales de calcio. Este medicamento puede ser utilizado para otros usos; si tiene alguna pregunta consulte con su proveedor de atencin mdica o con su farmacutico. MARCAS COMUNES: Adalat, Procardia Qu le debo informar a mi profesional de la salud antes de tomar este medicamento? Necesitan saber si usted presenta alguno de los siguientes problemas o situaciones: Ataque cardiaco Enfermedad cardiaca Insuficiencia cardiaca Presin arterial alta Presin arterial baja Una reaccin alrgica o inusual al nifedipino, a otros medicamentos, alimentos, colorantes o conservantes Si est embarazada o buscando quedar embarazada Si est amamantando a un beb Cmo debo utilizar este medicamento? Tome este medicamento por va oral. Use el medicamento segn las instrucciones en la etiqueta a la misma hora todos [place]. Puede tomarlo con o sin alimentos. Si el Social worker, tmelo con alimentos. Siga usndolo a menos que su equipo de atencin le indique dejar de Media planner. No tome este medicamento con jugo de toronja (pomelo). Hable con su equipo de atencin sobre el uso de este medicamento en nios. Puede requerir atencin especial. Sobredosis: Pngase en contacto inmediatamente con un centro toxicolgico o una sala de urgencia si usted cree que haya tomado demasiado medicamento. ATENCIN: [employer] es solo para usted. No comparta este medicamento con nadie. Qu sucede si me olvido de una dosis? Si olvida una dosis, adminstrela lo antes posible. Si es casi la hora de la prxima dosis, administre solo esa dosis. No se administre dosis adicionales o dobles. Qu puede interactuar con este  medicamento? No use este medicamento con ninguno de los siguientes productos: Ciertos medicamentos para convulsiones, tales como carbamazepina, fenobarbital, Customer service manager; ivacaftor Rifabutina Rifampicina Rifapentina Hierba de [place] medicamento tambin podra Product/process development scientist con los siguientes productos: Medicamentos antivirales para el VIH o SIDA Ciertos medicamentos para la presin arterial Ciertos medicamentos para la diabetes Ciertos medicamentos para la disfuncin erctil Ciertos medicamentos para las infecciones micticas, tales como ketoconazol, fluconazol e itraconazol Ciertos medicamentos para la frecuencia cardiaca irregular, tales como flecainida y quinidina Ciertos medicamentos que tratan o previenen cogulos sanguneos, como warfarina Claritromicina Digoxina Dolasetrn Eritromicina Fluoxetina Jugo de toronja (pomelo) Anestesia local o general Nefazodona Orlistat Quinupristina; dalfopristina Sirolims Bloqueadores del cido del estmago, tales como cimetidina, ranitidina, omeprazol o pantoprazol Tacrolims cido valproico Puede ser que esta lista no menciona todas las posibles interacciones. Informe a su profesional de [employer] de [employer] productos a base de hierbas, medicamentos de [place] o suplementos nutritivos que est tomando. Si usted fuma, consume bebidas alcohlicas o si utiliza drogas ilegales, indqueselo tambin a su profesional de [employer]. Algunas sustancias pueden interactuar con su medicamento. A qu debo estar atento al usar [employer]? Visite a su equipo de atencin para que revise su evolucin peridicamente. Revise su presin arterial como se le indique. Pregunte a su equipo de atencin cul debe ser su presin arterial. Tambin averige cundo deber contactarlos. No se trate usted mismo si tiene tos, resfriado o Scientist, research (physical sciences) est usando este medicamento sin consultar con su equipo de atencin. Algunos medicamentos pueden  aumentar su presin arterial. Puede experimentar somnolencia o mareos. No conduzca, no utilice maquinaria ni haga nada que Scientist, research (life sciences) en estado de alerta hasta que sepa cmo le afecta este medicamento. No  se ponga de pie ni se siente con rapidez, especialmente si es un paciente de edad avanzada. Esto reduce el riesgo de mareos o [employer]. Qu efectos secundarios puedo tener al [employer] este medicamento? Efectos secundarios que debe informar a su equipo de atencin tan pronto como sea posible: Reacciones alrgicas: erupcin cutnea, comezn/picazn, urticaria, hinchazn de la cara, los labios, la lengua o la garganta Ataque cardiaco: Engineer, mining u opresin en el pecho, los hombros, los brazos o la [place], nuseas, falta de [place], piel fra o sudorosa, sensacin de [place] o aturdimiento Insuficiencia cardiaca: falta de aire, hinchazn de los tobillos, los pies o las manos, aumento de peso repentino, debilidad o fatiga inusuales Presin arterial baja: mareo, sensacin de desmayo o aturdimiento, visin borrosa Empeoramiento del Journalist, newspaper (angina de pecho): dolor, presin u opresin en el pecho, cuello, espalda o brazos Efectos secundarios que generalmente no requieren atencin mdica (debe informarlos a su equipo de atencin si persisten o si son molestos): Estreimiento [employer] Enrojecimiento facial Dolor de Training and development officer Palpitaciones cardacas: frecuencia cardiaca rpida, intensa o irregular Nuseas Dolor estomacal Puede ser que esta lista no menciona todos los posibles efectos secundarios. Comunquese a su mdico por asesoramiento mdico [employer]. Usted puede informar los efectos secundarios a la FDA por telfono al 1-800-FDA-1088. Dnde debo guardar mi medicina? Mantenga fuera del alcance de nios y Neurosurgeon. Guarde a [employer], entre 15 y 25 grados Celsius (59 y 42 grados Fahrenheit). Proteja de la luz y la humedad. Mantenga el recipiente bien cerrado.  Deseche todo el medicamento que no haya utilizado despus de la fecha de vencimiento. ATENCIN: Este folleto es un resumen. Puede ser que no cubra toda la posible informacin. Si usted tiene preguntas acerca de esta medicina, consulte con su mdico, su farmacutico o su profesional de Radiographer, therapeutic.  2023 Elsevier/Gold Standard (2021-06-30 00:00:00)

## 2021-12-02 NOTE — Progress Notes (Signed)
.  Pt presents for chronic care management   

## 2021-12-04 ENCOUNTER — Other Ambulatory Visit: Payer: Self-pay

## 2021-12-07 ENCOUNTER — Ambulatory Visit: Payer: Self-pay | Admitting: Family

## 2021-12-08 ENCOUNTER — Ambulatory Visit: Payer: Self-pay | Admitting: Family

## 2021-12-08 DIAGNOSIS — I10 Essential (primary) hypertension: Secondary | ICD-10-CM

## 2021-12-08 DIAGNOSIS — Z131 Encounter for screening for diabetes mellitus: Secondary | ICD-10-CM

## 2021-12-08 DIAGNOSIS — Z789 Other specified health status: Secondary | ICD-10-CM

## 2022-01-07 ENCOUNTER — Other Ambulatory Visit: Payer: Self-pay

## 2022-01-08 ENCOUNTER — Other Ambulatory Visit: Payer: Self-pay

## 2022-01-11 ENCOUNTER — Other Ambulatory Visit: Payer: Self-pay

## 2022-01-12 ENCOUNTER — Other Ambulatory Visit: Payer: Self-pay

## 2022-03-25 ENCOUNTER — Ambulatory Visit: Payer: Self-pay | Admitting: *Deleted

## 2022-03-25 NOTE — Telephone Encounter (Signed)
  Chief Complaint: Abdominal Bloating Symptoms: Bloating, constipation, fatigue. None of these presently. States onset after starting Nifedipine, last took med 3 days ago, symptoms subsided. "Feel normal now." Frequency: Stopped taking 3 days  Pertinent Negatives: Patient denies Disposition: [] ED /[] Urgent Care (no appt availability in office) / [] Appointment(In office/virtual)/ []  [facility]/ [] Home Care/ [] Refused Recommended Disposition /[] [facility]/ [x]  Follow-up with PCP Additional Notes: Asked pt to check BP during call, reports 135/84. Pt would like alternative med if possible. States was on Lisinopril previously, "Changed because I was pregnant." Reason for Disposition  Abdomen BLOATING (e.g., feeling full or gassy; no visible swelling)  Answer Assessment - Initial Assessment Questions 1. SYMPTOM: "What's the main symptom you're concerned about?" (e.g., abdomen bloating, swelling)     Bloating, constipation 2. ONSET: "When did   start?"     After taking Nifedipine 3. SEVERITY: "How bad is the bloating or swelling?"    - BLOATING: Feels gassy or bloated. No visible swelling.     - MILD SWELLING: Feels gassy or bloated. Abdomen looks mildly distended or swollen.    - MODERATE - SEVERE SWELLING: Abdomen looks very distended or swollen.      None presently 4. ABDOMEN PAIN:  "Is there any abdomen pain?" If Yes, ask: "How bad is the pain?"  (e.g., Scale 1-10; mild, moderate, or severe)   - NONE (0): No pain.   - MILD (1-3): Doesn't interfere with normal activities, abdomen soft and not tender to touch.    - MODERATE (4-7): Interferes with normal activities or awakens from sleep, abdomen tender to touch.    - SEVERE (8-10): Excruciating pain, doubled over, unable to do any normal activities.       When occurs, "Just bloating" 5. RELIEVING AND AGGRAVATING FACTORS: "What makes it better or worse?" (e.g., certain foods, lactose, medicines)     Did not take for 3  days "Better" 6. GI HISTORY: "Do you have any history of stomach or intestine problems?" (e.g., bowel obstruction, cancer, irritable bowel)      no 7. CAUSE: "What do you think is causing the bloating?"      Nifedipine 8. OTHER SYMPTOMS: "Do you have any other symptoms?" (e.g., belching, blood in stool, breathing difficulty, constipation, diarrhea, fever, passing gas, vomiting, weight loss, white of eyes have turned yellow)     Fatigue, constipation  Protocols used: Abdomen Bloating and Swelling-A-AH

## 2022-03-25 NOTE — Telephone Encounter (Signed)
Pt would need to schedule an appt for further evaluation  

## 2022-03-26 ENCOUNTER — Other Ambulatory Visit: Payer: Self-pay

## 2022-03-26 ENCOUNTER — Ambulatory Visit (INDEPENDENT_AMBULATORY_CARE_PROVIDER_SITE_OTHER): Payer: Self-pay | Admitting: Family

## 2022-03-26 VITALS — BP 145/97 | HR 92 | Temp 98.3°F | Resp 16 | Ht 61.77 in | Wt 150.0 lb

## 2022-03-26 DIAGNOSIS — Z789 Other specified health status: Secondary | ICD-10-CM

## 2022-03-26 DIAGNOSIS — I10 Essential (primary) hypertension: Secondary | ICD-10-CM

## 2022-03-26 MED ORDER — LISINOPRIL 10 MG PO TABS
10.0000 mg | ORAL_TABLET | Freq: Every day | ORAL | 0 refills | Status: DC
  Filled 2022-03-26: qty 30, 30d supply, fill #0

## 2022-03-26 MED ORDER — LISINOPRIL 10 MG PO TABS: 10.0000 mg | ORAL_TABLET | Freq: Every day | ORAL | 0 refills | Status: DC

## 2022-03-26 NOTE — Progress Notes (Signed)
Pt presents for medication management  -pt wants to start back on Lisinopril 10 mg, due to she is not pregnant anymore and the Nifedipine cause her discomfort

## 2022-03-26 NOTE — Progress Notes (Signed)
Patient ID: Alexis Potts, female    DOB: 1985-06-30  MRN: [id number]  CC: Medication Problem   Subjective: Alexis Potts is a 36 y.o. female who presents for medication problem.  Her concerns today include:  03/25/2022 per triage RN note: Chief Complaint: Abdominal Bloating Symptoms: Bloating, constipation, fatigue. None of these presently. States onset after starting Nifedipine, last took med 3 days ago, symptoms subsided. "Feel normal now." Frequency: Stopped taking 3 days  Pertinent Negatives: Patient denies Disposition: [] ED /[] Urgent Care (no appt availability in office) / [] Appointment(In office/virtual)/ []  [facility]/ [] Home Care/ [] Refused Recommended Disposition /[] [facility]/ [x]  Follow-up with PCP Additional Notes: Asked pt to check BP during call, reports 135/84. Pt would like alternative med if possible. States was on Lisinopril previously, "Changed because I was pregnant." Reason for Disposition  Abdomen BLOATING (e.g., feeling full or gassy; no visible swelling)  Answer Assessment - Initial Assessment Questions 1. SYMPTOM: "What's the main symptom you're concerned about?" (e.g., abdomen bloating, swelling)     Bloating, constipation 2. ONSET: "When did   start?"     After taking Nifedipine 3. SEVERITY: "How bad is the bloating or swelling?"    - BLOATING: Feels gassy or bloated. No visible swelling.     - MILD SWELLING: Feels gassy or bloated. Abdomen looks mildly distended or swollen.    - MODERATE - SEVERE SWELLING: Abdomen looks very distended or swollen.      None presently 4. ABDOMEN PAIN:  "Is there any abdomen pain?" If Yes, ask: "How bad is the pain?"  (e.g., Scale 1-10; mild, moderate, or severe)   - NONE (0): No pain.   - MILD (1-3): Doesn't interfere with normal activities, abdomen soft and not tender to touch.    - MODERATE (4-7): Interferes with normal activities or awakens from sleep, abdomen tender to  touch.    - SEVERE (8-10): Excruciating pain, doubled over, unable to do any normal activities.       When occurs, "Just bloating" 5. RELIEVING AND AGGRAVATING FACTORS: "What makes it better or worse?" (e.g., certain foods, lactose, medicines)     Did not take for 3 days "Better" 6. GI HISTORY: "Do you have any history of stomach or intestine problems?" (e.g., bowel obstruction, cancer, irritable bowel)      no 7. CAUSE: "What do you think is causing the bloating?"      Nifedipine 8. OTHER SYMPTOMS: "Do you have any other symptoms?" (e.g., belching, blood in stool, breathing difficulty, constipation, diarrhea, fever, passing gas, vomiting, weight loss, white of eyes have turned yellow)     Fatigue, constipation  Today's visit 03/26/2022: - Concerns of Nifedipine causing side effects (see triage RN note). The same was initiated by OB/GYN in 2022. She would like to restart Lisinopril since she is no longer pregnant. She is not breastfeeding. Blood pressures at home above goal. Denies red flag symptoms.   - Taking over-the-counter potassium and magnesium supplements per her preference.  - Concern about hormones since delivering her baby who will turn 5 year-old soon. Reports she has an annual visit with GYN upcoming.   Patient Active Problem List   Diagnosis Date Noted   Vaginal delivery 04/09/2021   Pre-existing essential hypertension during pregnancy in third trimester 02/10/2021   Supervision of high risk pregnancy, antepartum 10/13/2020   Chronic hypertension 10/13/2020   Language barrier 08/13/2019     Current Outpatient Medications on File Prior to Visit  Medication Sig  Dispense Refill   loratadine (CLARITIN) 10 MG tablet Take 10 mg by mouth daily.     acetaminophen (TYLENOL) 325 MG tablet Take 2 tablets (650 mg total) by mouth every 4 (four) hours as needed (for pain scale < 4). 30 tablet 0   furosemide (LASIX) 20 MG tablet Take 1 tablet (20 mg total) by mouth daily for 5 days. 5  tablet 0   ibuprofen (ADVIL) 600 MG tablet Take 1 tablet (600 mg total) by mouth every 6 (six) hours. 30 tablet 0   NIFEdipine (ADALAT CC) 30 MG 24 hr tablet Take 1 tablet (30 mg total) by mouth daily. 30 tablet 5   Prenatal Vit-Fe Fumarate-FA (PREPLUS) 27-1 MG TABS Take 1 tablet by mouth daily. 30 tablet 6   No current facility-administered medications on file prior to visit.    No Known Allergies  Social History   Socioeconomic History   Marital status: Single    Spouse name: Not on file   Number of children: Not on file   Years of education: Not on file   Highest education level: Not on file  Occupational History   Not on file  Tobacco Use   Smoking status: Never    Passive exposure: Never   Smokeless tobacco: Never  Vaping Use   Vaping Use: Never used  Substance and Sexual Activity   Alcohol use: Not Currently   Drug use: Never   Sexual activity: Yes    Birth control/protection: None  Other Topics Concern   Not on file  Social History Narrative   Not on file   Social Determinants of Health   Financial Resource Strain: Not on file  Food Insecurity: No Food Insecurity (12/12/2020)   Hunger Vital Sign    Worried About Running Out of Food in the Last Year: Never true    Ran Out of Food in the Last Year: Never true  Transportation Needs: No Transportation Needs (12/12/2020)   PRAPARE - Administrator, Civil Service (Medical): No    Lack of Transportation (Non-Medical): No  Physical Activity: Not on file  Stress: Not on file  Social Connections: Not on file  Intimate Partner Violence: Not At Risk (10/13/2020)   Humiliation, Afraid, Rape, and Kick questionnaire    Fear of Current or Ex-Partner: No    Emotionally Abused: No    Physically Abused: No    Sexually Abused: No    Family History  Problem Relation Age of Onset   Diabetes Brother     Past Surgical History:  Procedure Laterality Date   NO PAST SURGERIES      ROS: Review of  Systems Negative except as stated above  PHYSICAL EXAM: BP (!) 145/97 (BP Location: Left Arm, Patient Position: Sitting, Cuff Size: Normal)   Pulse 92   Temp 98.3 F (36.8 C)   Resp 16   Ht 5' 1.77" (1.569 m)   Wt 150 lb (68 kg)   SpO2 98%   BMI 27.64 kg/m   Physical Exam HENT:     Head: Normocephalic and atraumatic.  Eyes:     Extraocular Movements: Extraocular movements intact.     Conjunctiva/sclera: Conjunctivae normal.     Pupils: Pupils are equal, round, and reactive to light.  Cardiovascular:     Rate and Rhythm: Normal rate and regular rhythm.     Pulses: Normal pulses.     Heart sounds: Normal heart sounds.  Pulmonary:     Effort: Pulmonary effort is normal.  Breath sounds: Normal breath sounds.  Musculoskeletal:     Cervical back: Normal range of motion and neck supple.  Neurological:     General: No focal deficit present.     Mental Status: She is alert and oriented to person, place, and time.  Psychiatric:        Mood and Affect: Mood normal.        Behavior: Behavior normal.     ASSESSMENT AND PLAN: 1. Primary hypertension - Nifedipine discontinued due to patient side effects.  - Begin Lisinopril as prescribed. Counseled on medication adherence/adverse effects.  - Counseled on blood pressure goal of less than 130/80, low-sodium, DASH diet, medication compliance, and 150 minutes of moderate intensity exercise per week as tolerated. Counseled on medication adherence and adverse effects. - Routine labs.  - Follow-up with primary provider in 2 weeks or sooner if needed for blood pressure check.  - lisinopril (ZESTRIL) 10 MG tablet; Take 1 tablet (10 mg total) by mouth daily.  Dispense: 30 tablet; Refill: 0 - Potassium - Magnesium  2. Language barrier - Photographer. Name: Damaris Schooner   ID#: [id number]    Patient was given the opportunity to ask questions.  Patient verbalized understanding of the plan and was able to repeat key elements of the plan.  Patient was given clear instructions to go to Emergency Department or return to medical center if symptoms don't improve, worsen, or new problems develop.The patient verbalized understanding.   Orders Placed This Encounter  Procedures   Potassium   Magnesium     Requested Prescriptions   Signed Prescriptions Disp Refills   lisinopril (ZESTRIL) 10 MG tablet 30 tablet 0    Sig: Take 1 tablet (10 mg total) by mouth daily.    Return in about 2 weeks (around 04/09/2022) for Follow-Up or next available bp check .  Rema Fendt, NP

## 2022-03-27 LAB — POTASSIUM: Potassium: 4 mmol/L (ref 3.5–5.2)

## 2022-03-27 LAB — MAGNESIUM: Magnesium: 2.5 mg/dL — ABNORMAL HIGH (ref 1.6–2.3)

## 2022-03-30 ENCOUNTER — Ambulatory Visit: Payer: Self-pay | Admitting: Family

## 2022-04-05 NOTE — Progress Notes (Signed)
Patient ID: Alexis Potts, female    DOB: 05-08-1985  MRN: [id number]  CC: Blood Pressure Check   Subjective: Alexis Potts is a 36 y.o. female who presents for blood pressure check.   Her concerns today include:  - Doing well on blood pressure medication, no issues/concerns. Denies red flag symptoms.  - Bloating and constipation x 2 years. Denies red flag symptoms.    Patient Active Problem List   Diagnosis Date Noted   Vaginal delivery 04/09/2021   Pre-existing essential hypertension during pregnancy in third trimester 02/10/2021   Supervision of high risk pregnancy, antepartum 10/13/2020   Chronic hypertension 10/13/2020   Language barrier 08/13/2019     Current Outpatient Medications on File Prior to Visit  Medication Sig Dispense Refill   acetaminophen (TYLENOL) 325 MG tablet Take 2 tablets (650 mg total) by mouth every 4 (four) hours as needed (for pain scale < 4). 30 tablet 0   furosemide (LASIX) 20 MG tablet Take 1 tablet (20 mg total) by mouth daily for 5 days. 5 tablet 0   ibuprofen (ADVIL) 600 MG tablet Take 1 tablet (600 mg total) by mouth every 6 (six) hours. 30 tablet 0   loratadine (CLARITIN) 10 MG tablet Take 10 mg by mouth daily.     Prenatal Vit-Fe Fumarate-FA (PREPLUS) 27-1 MG TABS Take 1 tablet by mouth daily. 30 tablet 6   No current facility-administered medications on file prior to visit.    No Known Allergies  Social History   Socioeconomic History   Marital status: Single    Spouse name: Not on file   Number of children: Not on file   Years of education: Not on file   Highest education level: Not on file  Occupational History   Not on file  Tobacco Use   Smoking status: Never    Passive exposure: Never   Smokeless tobacco: Never  Vaping Use   Vaping Use: Never used  Substance and Sexual Activity   Alcohol use: Not Currently   Drug use: Never   Sexual activity: Yes    Birth control/protection: None  Other Topics  Concern   Not on file  Social History Narrative   Not on file   Social Determinants of Health   Financial Resource Strain: Not on file  Food Insecurity: No Food Insecurity (12/12/2020)   Hunger Vital Sign    Worried About Running Out of Food in the Last Year: Never true    Ran Out of Food in the Last Year: Never true  Transportation Needs: No Transportation Needs (12/12/2020)   PRAPARE - Administrator, Civil Service (Medical): No    Lack of Transportation (Non-Medical): No  Physical Activity: Not on file  Stress: Not on file  Social Connections: Not on file  Intimate Partner Violence: Not At Risk (10/13/2020)   Humiliation, Afraid, Rape, and Kick questionnaire    Fear of Current or Ex-Partner: No    Emotionally Abused: No    Physically Abused: No    Sexually Abused: No    Family History  Problem Relation Age of Onset   Diabetes Brother     Past Surgical History:  Procedure Laterality Date   NO PAST SURGERIES      ROS: Review of Systems Negative except as stated above  PHYSICAL EXAM: BP 131/89 (BP Location: Left Arm, Patient Position: Sitting, Cuff Size: Normal)   Pulse 67   Temp 98.3 F (36.8 C)   Resp 16  Ht 5' 1.77" (1.569 m)   Wt 150 lb (68 kg)   SpO2 97%   BMI 27.64 kg/m   Physical Exam HENT:     Head: Normocephalic and atraumatic.  Eyes:     Extraocular Movements: Extraocular movements intact.     Conjunctiva/sclera: Conjunctivae normal.     Pupils: Pupils are equal, round, and reactive to light.  Cardiovascular:     Rate and Rhythm: Normal rate and regular rhythm.     Pulses: Normal pulses.     Heart sounds: Normal heart sounds.  Pulmonary:     Effort: Pulmonary effort is normal.     Breath sounds: Normal breath sounds.  Abdominal:     General: Bowel sounds are normal.     Palpations: Abdomen is soft.  Musculoskeletal:     Cervical back: Normal range of motion and neck supple.  Neurological:     General: No focal deficit  present.     Mental Status: She is alert and oriented to person, place, and time.  Psychiatric:        Mood and Affect: Mood normal.        Behavior: Behavior normal.   ASSESSMENT AND PLAN: 1. Primary hypertension - Continue Lisinopril as prescribed.  - Counseled on blood pressure goal of less than 130/80, low-sodium, DASH diet, medication compliance, and 150 minutes of moderate intensity exercise per week as tolerated. Counseled on medication adherence and adverse effects. - Update BMP.  - Follow-up with primary provider in 3 months or sooner if needed.  - Basic Metabolic Panel - lisinopril (ZESTRIL) 10 MG tablet; Take 1 tablet (10 mg total) by mouth daily.  Dispense: 30 tablet; Refill: 2  2. Chronic constipation 3. Abdominal bloating - Miralax as prescribed. Counseled on medication adherence.  - Referral to Gastroenterology for further evaluation/management. - Ambulatory referral to Gastroenterology - polyethylene glycol (MIRALAX / GLYCOLAX) 17 g packet; Take 17 g by mouth daily.  Dispense: 30 each; Refill: 1  4. Language barrier - Photographer. Name: Alexis Potts  ID#: [id number]    Patient was given the opportunity to ask questions.  Patient verbalized understanding of the plan and was able to repeat key elements of the plan. Patient was given clear instructions to go to Emergency Department or return to medical center if symptoms don't improve, worsen, or new problems develop.The patient verbalized understanding.   Orders Placed This Encounter  Procedures   Basic Metabolic Panel   Ambulatory referral to Gastroenterology     Requested Prescriptions   Signed Prescriptions Disp Refills   lisinopril (ZESTRIL) 10 MG tablet 30 tablet 2    Sig: Take 1 tablet (10 mg total) by mouth daily.   polyethylene glycol (MIRALAX / GLYCOLAX) 17 g packet 30 each 1    Sig: Take 17 g by mouth daily.    Return in about 3 months (around 07/07/2022) for Follow-Up or next available chronic care  mgmt.  Rema Fendt, NP

## 2022-04-07 ENCOUNTER — Ambulatory Visit (INDEPENDENT_AMBULATORY_CARE_PROVIDER_SITE_OTHER): Payer: Self-pay | Admitting: Family

## 2022-04-07 ENCOUNTER — Other Ambulatory Visit: Payer: Self-pay

## 2022-04-07 VITALS — BP 131/89 | HR 67 | Temp 98.3°F | Resp 16 | Ht 61.77 in | Wt 150.0 lb

## 2022-04-07 DIAGNOSIS — I10 Essential (primary) hypertension: Secondary | ICD-10-CM

## 2022-04-07 DIAGNOSIS — R140 Abdominal distension (gaseous): Secondary | ICD-10-CM

## 2022-04-07 DIAGNOSIS — Z789 Other specified health status: Secondary | ICD-10-CM

## 2022-04-07 DIAGNOSIS — K5909 Other constipation: Secondary | ICD-10-CM

## 2022-04-07 MED ORDER — POLYETHYLENE GLYCOL 3350 17 G PO PACK
17.0000 g | PACK | Freq: Every day | ORAL | 1 refills | Status: DC
Start: 2022-04-07 — End: 2023-01-10
  Filled 2022-04-07: qty 30, 30d supply, fill #0

## 2022-04-07 MED ORDER — LISINOPRIL 10 MG PO TABS
10.0000 mg | ORAL_TABLET | Freq: Every day | ORAL | 2 refills | Status: DC
  Filled 2022-04-07: qty 30, 30d supply, fill #0
  Filled 2022-05-14: qty 60, 60d supply, fill #1

## 2022-04-07 NOTE — Progress Notes (Signed)
.  Pt presents for blood pressure check   -dealing w/constipation for past 2 years -bloating frequently

## 2022-04-08 ENCOUNTER — Other Ambulatory Visit: Payer: Self-pay

## 2022-04-08 LAB — BASIC METABOLIC PANEL
BUN/Creatinine Ratio: 18 (ref 9–23)
BUN: 9 mg/dL (ref 6–20)
CO2: 22 mmol/L (ref 20–29)
Calcium: 9.2 mg/dL (ref 8.7–10.2)
Chloride: 103 mmol/L (ref 96–106)
Creatinine, Ser: 0.5 mg/dL — ABNORMAL LOW (ref 0.57–1.00)
Glucose: 86 mg/dL (ref 70–99)
Potassium: 4.3 mmol/L (ref 3.5–5.2)
Sodium: 138 mmol/L (ref 134–144)
eGFR: 125 mL/min/{1.73_m2} (ref >59)

## 2022-04-09 ENCOUNTER — Other Ambulatory Visit: Payer: Self-pay

## 2022-04-13 ENCOUNTER — Ambulatory Visit: Payer: Self-pay | Attending: Family Medicine

## 2022-05-14 ENCOUNTER — Other Ambulatory Visit: Payer: Self-pay

## 2022-05-24 ENCOUNTER — Ambulatory Visit: Payer: Self-pay | Admitting: Family

## 2022-06-04 ENCOUNTER — Ambulatory Visit: Payer: Self-pay | Admitting: Family

## 2022-06-07 ENCOUNTER — Encounter: Payer: Self-pay | Admitting: Family

## 2022-07-05 NOTE — Progress Notes (Unsigned)
Patient ID: Alexis Potts, female    DOB: 03/29/1986  MRN: [id number]  CC: Chronic Care Management   Subjective: Alexis Potts is a 37 y.o. female who presents for chronic care management.   Her concerns today include:  HTN - Lisinopril   Patient Active Problem List   Diagnosis Date Noted   Vaginal delivery 04/09/2021   Pre-existing essential hypertension during pregnancy in third trimester 02/10/2021   Supervision of high risk pregnancy, antepartum 10/13/2020   Chronic hypertension 10/13/2020   Language barrier 08/13/2019     Current Outpatient Medications on File Prior to Visit  Medication Sig Dispense Refill   acetaminophen (TYLENOL) 325 MG tablet Take 2 tablets (650 mg total) by mouth every 4 (four) hours as needed (for pain scale < 4). 30 tablet 0   furosemide (LASIX) 20 MG tablet Take 1 tablet (20 mg total) by mouth daily for 5 days. 5 tablet 0   ibuprofen (ADVIL) 600 MG tablet Take 1 tablet (600 mg total) by mouth every 6 (six) hours. 30 tablet 0   lisinopril (ZESTRIL) 10 MG tablet Take 1 tablet (10 mg total) by mouth daily. 30 tablet 2   loratadine (CLARITIN) 10 MG tablet Take 10 mg by mouth daily.     polyethylene glycol (MIRALAX / GLYCOLAX) 17 g packet Take 17 g by mouth daily. 30 each 1   Prenatal Vit-Fe Fumarate-FA (PREPLUS) 27-1 MG TABS Take 1 tablet by mouth daily. 30 tablet 6   No current facility-administered medications on file prior to visit.    No Known Allergies  Social History   Socioeconomic History   Marital status: Single    Spouse name: Not on file   Number of children: Not on file   Years of education: Not on file   Highest education level: Not on file  Occupational History   Not on file  Tobacco Use   Smoking status: Never    Passive exposure: Never   Smokeless tobacco: Never  Vaping Use   Vaping Use: Never used  Substance and Sexual Activity   Alcohol use: Not Currently   Drug use: Never   Sexual activity:  Yes    Birth control/protection: None  Other Topics Concern   Not on file  Social History Narrative   Not on file   Social Determinants of Health   Financial Resource Strain: Not on file  Food Insecurity: No Food Insecurity (12/12/2020)   Hunger Vital Sign    Worried About Running Out of Food in the Last Year: Never true    Ran Out of Food in the Last Year: Never true  Transportation Needs: No Transportation Needs (12/12/2020)   PRAPARE - Hydrologist (Medical): No    Lack of Transportation (Non-Medical): No  Physical Activity: Not on file  Stress: Not on file  Social Connections: Not on file  Intimate Partner Violence: Not At Risk (10/13/2020)   Humiliation, Afraid, Rape, and Kick questionnaire    Fear of Current or Ex-Partner: No    Emotionally Abused: No    Physically Abused: No    Sexually Abused: No    Family History  Problem Relation Age of Onset   Diabetes Brother     Past Surgical History:  Procedure Laterality Date   NO PAST SURGERIES      ROS: Review of Systems Negative except as stated above  PHYSICAL EXAM: There were no vitals taken for this visit.  Physical Exam  {  female adult master:310786} {female adult master:310785}     Latest Ref Rng & Units 04/07/2022    4:49 PM 03/26/2022    4:32 PM 08/12/2021    4:04 PM  CMP  Glucose 70 - 99 mg/dL 86   89   BUN 6 - 20 mg/dL 9   12   Creatinine 0.57 - 1.00 mg/dL 0.50   0.78   Sodium 134 - 144 mmol/L 138   138   Potassium 3.5 - 5.2 mmol/L 4.3  4.0  4.2   Chloride 96 - 106 mmol/L 103   105   CO2 20 - 29 mmol/L 22   23   Calcium 8.7 - 10.2 mg/dL 9.2   9.5    Lipid Panel     Component Value Date/Time   CHOL 219 (H) 06/23/2020 0942   TRIG 169 (H) 06/23/2020 0942   HDL 48 06/23/2020 0942   CHOLHDL 4.6 (H) 06/23/2020 0942   LDLCALC 141 (H) 06/23/2020 0942    CBC    Component Value Date/Time   WBC 5.9 04/08/2021 1029   RBC 4.30 04/08/2021 1029   HGB 13.5 04/08/2021 1029    HGB 12.5 01/12/2021 1056   HCT 39.4 04/08/2021 1029   HCT 36.4 01/12/2021 1056   PLT 218 04/08/2021 1029   PLT 235 01/12/2021 1056   MCV 91.6 04/08/2021 1029   MCV 92 01/12/2021 1056   MCH 31.4 04/08/2021 1029   MCHC 34.3 04/08/2021 1029   RDW 13.9 04/08/2021 1029   RDW 13.1 01/12/2021 1056   LYMPHSABS 1.6 10/13/2020 1014   EOSABS 0.1 10/13/2020 1014   BASOSABS 0.0 10/13/2020 1014    ASSESSMENT AND PLAN:  There are no diagnoses linked to this encounter.   Patient was given the opportunity to ask questions.  Patient verbalized understanding of the plan and was able to repeat key elements of the plan. Patient was given clear instructions to go to Emergency Department or return to medical center if symptoms don't improve, worsen, or new problems develop.The patient verbalized understanding.   No orders of the defined types were placed in this encounter.    Requested Prescriptions    No prescriptions requested or ordered in this encounter    No follow-ups on file.  Camillia Herter, NP

## 2022-07-07 ENCOUNTER — Other Ambulatory Visit: Payer: Self-pay

## 2022-07-07 ENCOUNTER — Ambulatory Visit (INDEPENDENT_AMBULATORY_CARE_PROVIDER_SITE_OTHER): Payer: Self-pay | Admitting: Family

## 2022-07-07 VITALS — BP 108/75 | HR 79 | Temp 98.3°F | Resp 16 | Ht 61.77 in | Wt 160.0 lb

## 2022-07-07 DIAGNOSIS — Z0120 Encounter for dental examination and cleaning without abnormal findings: Secondary | ICD-10-CM

## 2022-07-07 DIAGNOSIS — Z789 Other specified health status: Secondary | ICD-10-CM

## 2022-07-07 DIAGNOSIS — Z13220 Encounter for screening for lipoid disorders: Secondary | ICD-10-CM

## 2022-07-07 DIAGNOSIS — I10 Essential (primary) hypertension: Secondary | ICD-10-CM

## 2022-07-07 MED ORDER — LISINOPRIL 10 MG PO TABS
10.0000 mg | ORAL_TABLET | Freq: Every day | ORAL | 2 refills | Status: DC
  Filled 2022-07-07: qty 30, 30d supply, fill #0
  Filled 2022-08-23: qty 60, 60d supply, fill #1

## 2022-07-07 NOTE — Progress Notes (Signed)
.  Pt presents for chronic care management  -needs refill on Lisinopril

## 2022-07-08 ENCOUNTER — Other Ambulatory Visit: Payer: Self-pay | Admitting: Family

## 2022-07-08 ENCOUNTER — Other Ambulatory Visit: Payer: Self-pay

## 2022-07-08 DIAGNOSIS — E785 Hyperlipidemia, unspecified: Secondary | ICD-10-CM

## 2022-07-08 LAB — LIPID PANEL
Chol/HDL Ratio: 4.4 ratio (ref 0.0–4.4)
Cholesterol, Total: 225 mg/dL — ABNORMAL HIGH (ref 100–199)
HDL: 51 mg/dL (ref >39)
LDL Chol Calc (NIH): 141 mg/dL — ABNORMAL HIGH (ref 0–99)
Triglycerides: 187 mg/dL — ABNORMAL HIGH (ref 0–149)
VLDL Cholesterol Cal: 33 mg/dL (ref 5–40)

## 2022-07-08 MED ORDER — ATORVASTATIN CALCIUM 20 MG PO TABS
20.0000 mg | ORAL_TABLET | Freq: Every day | ORAL | 0 refills | Status: DC
  Filled 2022-07-08: qty 90, 90d supply, fill #0

## 2022-08-20 NOTE — Progress Notes (Unsigned)
Patient ID: Alexis Potts, female    DOB: 10-Mar-1986  MRN: [id number]  CC: Chronic Care Management   Subjective: Alexis Potts is a 37 y.o. female who presents for chronic care management.   Her concerns today include:  - Doing well on blood pressure medication, no issues/concerns. Home blood pressures 120's/60's. She denies red flag symptoms such as but not limited to chest pain, shortness of breath, worst headache of life, nausea/vomiting.   - Doing well on cholesterol medication, no issues/concerns.  - No further issues/concerns for discussion today.    Patient Active Problem List   Diagnosis Date Noted   Hyperlipidemia 07/08/2022   Vaginal delivery 04/09/2021   Pre-existing essential hypertension during pregnancy in third trimester 02/10/2021   Supervision of high risk pregnancy, antepartum 10/13/2020   Chronic hypertension 10/13/2020   Language barrier 08/13/2019     Current Outpatient Medications on File Prior to Visit  Medication Sig Dispense Refill   acetaminophen (TYLENOL) 325 MG tablet Take 2 tablets (650 mg total) by mouth every 4 (four) hours as needed (for pain scale < 4). (Patient not taking: Reported on 08/24/2022) 30 tablet 0   ibuprofen (ADVIL) 600 MG tablet Take 1 tablet (600 mg total) by mouth every 6 (six) hours. (Patient not taking: Reported on 08/24/2022) 30 tablet 0   loratadine (CLARITIN) 10 MG tablet Take 10 mg by mouth daily. (Patient not taking: Reported on 08/24/2022)     polyethylene glycol (MIRALAX / GLYCOLAX) 17 g packet Take 17 g by mouth daily. (Patient not taking: Reported on 08/24/2022) 30 each 1   No current facility-administered medications on file prior to visit.    No Known Allergies  Social History   Socioeconomic History   Marital status: Single    Spouse name: Not on file   Number of children: Not on file   Years of education: Not on file   Highest education level: Not on file  Occupational History   Not on file   Tobacco Use   Smoking status: Never    Passive exposure: Never   Smokeless tobacco: Never  Vaping Use   Vaping Use: Never used  Substance and Sexual Activity   Alcohol use: Not Currently   Drug use: Never   Sexual activity: Yes    Birth control/protection: None  Other Topics Concern   Not on file  Social History Narrative   Not on file   Social Determinants of Health   Financial Resource Strain: Not on file  Food Insecurity: No Food Insecurity (12/12/2020)   Hunger Vital Sign    Worried About Running Out of Food in the Last Year: Never true    Ran Out of Food in the Last Year: Never true  Transportation Needs: No Transportation Needs (12/12/2020)   PRAPARE - Administrator, Civil Service (Medical): No    Lack of Transportation (Non-Medical): No  Physical Activity: Not on file  Stress: Not on file  Social Connections: Not on file  Intimate Partner Violence: Not At Risk (10/13/2020)   Humiliation, Afraid, Rape, and Kick questionnaire    Fear of Current or Ex-Partner: No    Emotionally Abused: No    Physically Abused: No    Sexually Abused: No    Family History  Problem Relation Age of Onset   Diabetes Brother     Past Surgical History:  Procedure Laterality Date   NO PAST SURGERIES      ROS: Review of Systems Negative  except as stated above  PHYSICAL EXAM: BP 126/81 (BP Location: Right Arm, Patient Position: Sitting, Cuff Size: Normal)   Pulse 82   Temp 98.1 F (36.7 C) (Oral)   Resp 16   Wt 156 lb (70.8 kg)   LMP 08/07/2022 (Approximate)   SpO2 98%   BMI 28.74 kg/m   Physical Exam HENT:     Head: Normocephalic and atraumatic.  Eyes:     Extraocular Movements: Extraocular movements intact.     Conjunctiva/sclera: Conjunctivae normal.     Pupils: Pupils are equal, round, and reactive to light.  Cardiovascular:     Rate and Rhythm: Normal rate and regular rhythm.     Pulses: Normal pulses.     Heart sounds: Normal heart sounds.   Pulmonary:     Effort: Pulmonary effort is normal.     Breath sounds: Normal breath sounds.  Musculoskeletal:     Cervical back: Normal range of motion and neck supple.  Neurological:     General: No focal deficit present.     Mental Status: She is alert and oriented to person, place, and time.  Psychiatric:        Mood and Affect: Mood normal.        Behavior: Behavior normal.      ASSESSMENT AND PLAN: 1. Primary hypertension - Continue Lisinopril as prescribed.  - Counseled on blood pressure goal of less than 130/80, low-sodium, DASH diet, medication compliance, and 150 minutes of moderate intensity exercise per week as tolerated. Counseled on medication adherence and adverse effects. - Follow-up with primary provider in 6 months or sooner if needed.  - lisinopril (ZESTRIL) 10 MG tablet; Take 1 tablet (10 mg total) by mouth daily.  Dispense: 90 tablet; Refill: 0  2. Hyperlipidemia, unspecified hyperlipidemia type - Continue Atorvastatin as prescribed.  - Routine screening.  - Follow-up with primary provider as scheduled.  - Lipid panel - atorvastatin (LIPITOR) 20 MG tablet; Take 1 tablet (20 mg total) by mouth daily.  Dispense: 90 tablet; Refill: 0  3. Language barrier - [facility] [employer] [facility]. Name: [name]  ID#: [id number]   Patient was given the opportunity to ask questions.  Patient verbalized understanding of the plan and was able to repeat key elements of the plan. Patient was given clear instructions to go to Emergency Department or return to medical center if symptoms don't improve, worsen, or new problems develop.The patient verbalized understanding.   Orders Placed This Encounter  Procedures   Lipid panel     Requested Prescriptions   Signed Prescriptions Disp Refills   lisinopril (ZESTRIL) 10 MG tablet 90 tablet 0    Sig: Take 1 tablet (10 mg total) by mouth daily.   atorvastatin (LIPITOR) 20 MG tablet 90 tablet 0    Sig: Take 1 tablet (20 mg total) by mouth  daily.    Return in about 6 months (around 02/24/2023) for Follow-Up or next available chronic care mgmt .  Rema Fendt, NP

## 2022-08-23 ENCOUNTER — Other Ambulatory Visit: Payer: Self-pay

## 2022-08-24 ENCOUNTER — Encounter: Payer: Self-pay | Admitting: Family

## 2022-08-24 ENCOUNTER — Ambulatory Visit (INDEPENDENT_AMBULATORY_CARE_PROVIDER_SITE_OTHER): Payer: Self-pay | Admitting: Family

## 2022-08-24 ENCOUNTER — Other Ambulatory Visit: Payer: Self-pay

## 2022-08-24 VITALS — BP 126/81 | HR 82 | Temp 98.1°F | Resp 16 | Wt 156.0 lb

## 2022-08-24 DIAGNOSIS — E785 Hyperlipidemia, unspecified: Secondary | ICD-10-CM

## 2022-08-24 DIAGNOSIS — Z758 Other problems related to medical facilities and other health care: Secondary | ICD-10-CM

## 2022-08-24 DIAGNOSIS — Z603 Acculturation difficulty: Secondary | ICD-10-CM

## 2022-08-24 DIAGNOSIS — I10 Essential (primary) hypertension: Secondary | ICD-10-CM

## 2022-08-24 MED ORDER — LISINOPRIL 10 MG PO TABS
10.0000 mg | ORAL_TABLET | Freq: Every day | ORAL | 0 refills | Status: DC
  Filled 2022-08-24 – 2022-10-14 (×2): qty 90, 90d supply, fill #0

## 2022-08-24 MED ORDER — ATORVASTATIN CALCIUM 20 MG PO TABS
20.0000 mg | ORAL_TABLET | Freq: Every day | ORAL | 0 refills | Status: DC
  Filled 2022-08-24 – 2022-10-14 (×2): qty 90, 90d supply, fill #0

## 2022-08-24 NOTE — Progress Notes (Signed)
F/u HTN and Cholesterol

## 2022-08-25 LAB — LIPID PANEL
Chol/HDL Ratio: 3.2 ratio (ref 0.0–4.4)
Cholesterol, Total: 161 mg/dL (ref 100–199)
HDL: 50 mg/dL (ref >39)
LDL Chol Calc (NIH): 88 mg/dL (ref 0–99)
Triglycerides: 130 mg/dL (ref 0–149)
VLDL Cholesterol Cal: 23 mg/dL (ref 5–40)

## 2022-10-14 ENCOUNTER — Other Ambulatory Visit: Payer: Self-pay

## 2023-01-10 ENCOUNTER — Other Ambulatory Visit: Payer: Self-pay

## 2023-01-10 ENCOUNTER — Ambulatory Visit (INDEPENDENT_AMBULATORY_CARE_PROVIDER_SITE_OTHER): Payer: Self-pay | Admitting: Family

## 2023-01-10 ENCOUNTER — Encounter: Payer: Self-pay | Admitting: Family

## 2023-01-10 VITALS — BP 128/86 | HR 85 | Temp 98.7°F | Ht 61.5 in | Wt 153.2 lb

## 2023-01-10 DIAGNOSIS — I10 Essential (primary) hypertension: Secondary | ICD-10-CM

## 2023-01-10 DIAGNOSIS — Z0100 Encounter for examination of eyes and vision without abnormal findings: Secondary | ICD-10-CM

## 2023-01-10 DIAGNOSIS — E785 Hyperlipidemia, unspecified: Secondary | ICD-10-CM

## 2023-01-10 DIAGNOSIS — Z23 Encounter for immunization: Secondary | ICD-10-CM

## 2023-01-10 DIAGNOSIS — Z131 Encounter for screening for diabetes mellitus: Secondary | ICD-10-CM

## 2023-01-10 MED ORDER — LISINOPRIL 10 MG PO TABS
10.0000 mg | ORAL_TABLET | Freq: Every day | ORAL | 0 refills | Status: DC
  Filled 2023-01-10: qty 90, 90d supply, fill #0

## 2023-01-10 MED ORDER — ATORVASTATIN CALCIUM 20 MG PO TABS
20.0000 mg | ORAL_TABLET | Freq: Every day | ORAL | 0 refills | Status: DC
  Filled 2023-01-10: qty 90, 90d supply, fill #0

## 2023-01-10 NOTE — Progress Notes (Signed)
Patient ID: Alexis Potts, female    DOB: 1986/02/19  MRN: [id number]  CC: Chronic Conditions Follow-Up  Subjective: Alexis Potts is a 37 y.o. female who presents for chronic conditions follow-up.   Her concerns today include:  - Doing well on Lisinopril, no issues/concerns. She does not complain of red flag symptoms such as but not limited to chest pain, shortness of breath, worst headache of life, nausea/vomiting.  - Doing well on Atorvastatin, no issues/concerns.  - Needs referral to eye doctor for routine exam.  Patient Active Problem List   Diagnosis Date Noted   Hyperlipidemia 07/08/2022   Vaginal delivery 04/09/2021   Pre-existing essential hypertension during pregnancy in third trimester 02/10/2021   Supervision of high risk pregnancy, antepartum 10/13/2020   Chronic hypertension 10/13/2020   Language barrier 08/13/2019     No current outpatient medications on file prior to visit.   No current facility-administered medications on file prior to visit.    No Known Allergies  Social History   Socioeconomic History   Marital status: Single    Spouse name: Not on file   Number of children: Not on file   Years of education: Not on file   Highest education level: Not on file  Occupational History   Not on file  Tobacco Use   Smoking status: Never    Passive exposure: Never   Smokeless tobacco: Never  Vaping Use   Vaping status: Never Used  Substance and Sexual Activity   Alcohol use: Not Currently   Drug use: Never   Sexual activity: Yes    Birth control/protection: None  Other Topics Concern   Not on file  Social History Narrative   Not on file   Social Determinants of Health   Financial Resource Strain: Not on file  Food Insecurity: No Food Insecurity (12/12/2020)   Hunger Vital Sign    Worried About Running Out of Food in the Last Year: Never true    Ran Out of Food in the Last Year: Never true  Transportation Needs: No  Transportation Needs (12/12/2020)   PRAPARE - Administrator, Civil Service (Medical): No    Lack of Transportation (Non-Medical): No  Physical Activity: Not on file  Stress: Not on file  Social Connections: Not on file  Intimate Partner Violence: Not At Risk (10/13/2020)   Humiliation, Afraid, Rape, and Kick questionnaire    Fear of Current or Ex-Partner: No    Emotionally Abused: No    Physically Abused: No    Sexually Abused: No    Family History  Problem Relation Age of Onset   Diabetes Brother     Past Surgical History:  Procedure Laterality Date   NO PAST SURGERIES      ROS: Review of Systems Negative except as stated above  PHYSICAL EXAM: BP 128/86   Pulse 85   Temp 98.7 F (37.1 C) (Oral)   Ht 5' 1.5" (1.562 m)   Wt 153 lb 3.2 oz (69.5 kg)   LMP 12/29/2022 (Exact Date)   SpO2 98%   BMI 28.48 kg/m   Physical Exam HENT:     Head: Normocephalic and atraumatic.     Nose: Nose normal.     Mouth/Throat:     Mouth: Mucous membranes are moist.     Pharynx: Oropharynx is clear.  Eyes:     Extraocular Movements: Extraocular movements intact.     Conjunctiva/sclera: Conjunctivae normal.     Pupils: Pupils are  equal, round, and reactive to light.  Cardiovascular:     Rate and Rhythm: Normal rate and regular rhythm.     Pulses: Normal pulses.     Heart sounds: Normal heart sounds.  Pulmonary:     Effort: Pulmonary effort is normal.     Breath sounds: Normal breath sounds.  Musculoskeletal:        General: Normal range of motion.     Cervical back: Normal range of motion and neck supple.  Neurological:     General: No focal deficit present.     Mental Status: She is alert and oriented to person, place, and time.  Psychiatric:        Mood and Affect: Mood normal.        Behavior: Behavior normal.     ASSESSMENT AND PLAN: 1. Primary hypertension - Continue Lisinopril as prescribed. - Routine screening.  - Counseled on blood pressure goal of  less than 130/80, low-sodium, DASH diet, medication compliance, and 150 minutes of moderate intensity exercise per week as tolerated. Counseled on medication adherence and adverse effects. - Follow-up with primary provider in 3 months or sooner if needed.  - Basic Metabolic Panel - lisinopril (ZESTRIL) 10 MG tablet; Take 1 tablet (10 mg total) by mouth daily.  Dispense: 90 tablet; Refill: 0  2. Hyperlipidemia, unspecified hyperlipidemia type - Continue Atorvastatin as prescribed. Counseled on medication adherence/adverse effects.  - Follow-up with primary provider in 3 months or sooner if needed. - atorvastatin (LIPITOR) 20 MG tablet; Take 1 tablet (20 mg total) by mouth daily.  Dispense: 90 tablet; Refill: 0  3. Diabetes mellitus screening - Routine screening.  - Hemoglobin A1c  4. Routine eye exam - Referral to Ophthalmology for evaluation/management. - Ambulatory referral to Ophthalmology  5. Immunization due - Administered.  - Flu vaccine trivalent PF, 6mos and older(Flulaval,Afluria,Fluarix,Fluzone)   Patient was given the opportunity to ask questions.  Patient verbalized understanding of the plan and was able to repeat key elements of the plan. Patient was given clear instructions to go to Emergency Department or return to medical center if symptoms don't improve, worsen, or new problems develop.The patient verbalized understanding.   Orders Placed This Encounter  Procedures   Flu vaccine trivalent PF, 6mos and older(Flulaval,Afluria,Fluarix,Fluzone)   Basic Metabolic Panel   Hemoglobin A1c   Ambulatory referral to Ophthalmology     Requested Prescriptions   Signed Prescriptions Disp Refills   atorvastatin (LIPITOR) 20 MG tablet 90 tablet 0    Sig: Take 1 tablet (20 mg total) by mouth daily.   lisinopril (ZESTRIL) 10 MG tablet 90 tablet 0    Sig: Take 1 tablet (10 mg total) by mouth daily.    Return in about 3 months (around 04/11/2023) for Follow-Up or next  available chronic conditions.  Rema Fendt, NP

## 2023-01-10 NOTE — Progress Notes (Signed)
Patient would like a Flu vaccine.    Patient would like to discuss a referral for her eyes.

## 2023-01-11 ENCOUNTER — Encounter: Payer: Self-pay | Admitting: Family

## 2023-01-11 DIAGNOSIS — R7303 Prediabetes: Secondary | ICD-10-CM | POA: Insufficient documentation

## 2023-01-11 LAB — BASIC METABOLIC PANEL
BUN/Creatinine Ratio: 18 (ref 9–23)
BUN: 9 mg/dL (ref 6–20)
CO2: 21 mmol/L (ref 20–29)
Calcium: 9.6 mg/dL (ref 8.7–10.2)
Chloride: 104 mmol/L (ref 96–106)
Creatinine, Ser: 0.51 mg/dL — ABNORMAL LOW (ref 0.57–1.00)
Glucose: 110 mg/dL — ABNORMAL HIGH (ref 70–99)
Potassium: 4.2 mmol/L (ref 3.5–5.2)
Sodium: 137 mmol/L (ref 134–144)
eGFR: 124 mL/min/{1.73_m2} (ref >59)

## 2023-01-11 LAB — HEMOGLOBIN A1C
Est. average glucose Bld gHb Est-mCnc: 117 mg/dL
Hgb A1c MFr Bld: 5.7 % — ABNORMAL HIGH (ref 4.8–5.6)

## 2023-01-13 ENCOUNTER — Other Ambulatory Visit: Payer: Self-pay

## 2023-02-25 ENCOUNTER — Ambulatory Visit: Payer: Self-pay | Admitting: Family

## 2023-07-11 ENCOUNTER — Other Ambulatory Visit: Payer: Self-pay

## 2023-07-11 ENCOUNTER — Ambulatory Visit (INDEPENDENT_AMBULATORY_CARE_PROVIDER_SITE_OTHER): Payer: Self-pay | Admitting: Family

## 2023-07-11 VITALS — BP 128/82 | HR 80 | Temp 98.2°F | Ht 61.5 in | Wt 155.2 lb

## 2023-07-11 DIAGNOSIS — Z758 Other problems related to medical facilities and other health care: Secondary | ICD-10-CM

## 2023-07-11 DIAGNOSIS — Z603 Acculturation difficulty: Secondary | ICD-10-CM

## 2023-07-11 DIAGNOSIS — E785 Hyperlipidemia, unspecified: Secondary | ICD-10-CM

## 2023-07-11 DIAGNOSIS — R7303 Prediabetes: Secondary | ICD-10-CM

## 2023-07-11 DIAGNOSIS — I10 Essential (primary) hypertension: Secondary | ICD-10-CM

## 2023-07-11 MED ORDER — ATORVASTATIN CALCIUM 20 MG PO TABS
20.0000 mg | ORAL_TABLET | Freq: Every day | ORAL | 0 refills | Status: DC
  Filled 2023-07-11: qty 90, 90d supply, fill #0

## 2023-07-11 MED ORDER — LISINOPRIL 10 MG PO TABS
10.0000 mg | ORAL_TABLET | Freq: Every day | ORAL | 0 refills | Status: DC
  Filled 2023-07-11: qty 90, 90d supply, fill #0

## 2023-07-11 NOTE — Progress Notes (Signed)
 Patient states nothing to discuss.

## 2023-07-11 NOTE — Progress Notes (Signed)
 Patient ID: Alexis Potts, female    DOB: 13-Jan-1986  MRN: [id number]  CC: Chronic Conditions Follow-Up  Subjective: Alexis Potts is a 38 y.o. female who presents for chronic conditions follow-up.  Her concerns today include:  - Doing well on Lisinopril, no issues/concerns. She does not complain of red flag symptoms such as but not limited to chest pain, shortness of breath, worst headache of life, nausea/vomiting.  - Doing well on Atorvastatin, no issues/concerns.  - Prediabetes follow-up.   Patient Active Problem List   Diagnosis Date Noted   Prediabetes 01/11/2023   Hyperlipidemia 07/08/2022   Vaginal delivery 04/09/2021   Pre-existing essential hypertension during pregnancy in third trimester 02/10/2021   Supervision of high risk pregnancy, antepartum 10/13/2020   Chronic hypertension 10/13/2020   Language barrier 08/13/2019     No current outpatient medications on file prior to visit.   No current facility-administered medications on file prior to visit.    No Known Allergies  Social History   Socioeconomic History   Marital status: Single    Spouse name: Not on file   Number of children: Not on file   Years of education: Not on file   Highest education level: Not on file  Occupational History   Not on file  Tobacco Use   Smoking status: Never    Passive exposure: Never   Smokeless tobacco: Never  Vaping Use   Vaping status: Never Used  Substance and Sexual Activity   Alcohol use: Not Currently   Drug use: Never   Sexual activity: Yes    Birth control/protection: None  Other Topics Concern   Not on file  Social History Narrative   Not on file   Social Drivers of Health   Financial Resource Strain: Low Risk  (07/11/2023)   Overall Financial Resource Strain (CARDIA)    Difficulty of Paying Living Expenses: Not very hard  Food Insecurity: No Food Insecurity (12/12/2020)   Hunger Vital Sign    Worried About Running Out of Food  in the Last Year: Never true    Ran Out of Food in the Last Year: Never true  Transportation Needs: No Transportation Needs (12/12/2020)   PRAPARE - Administrator, Civil Service (Medical): No    Lack of Transportation (Non-Medical): No  Physical Activity: Insufficiently Active (07/11/2023)   Exercise Vital Sign    Days of Exercise per Week: 3 days    Minutes of Exercise per Session: 20 min  Stress: Not on file  Social Connections: Not on file  Intimate Partner Violence: Not At Risk (10/13/2020)   Humiliation, Afraid, Rape, and Kick questionnaire    Fear of Current or Ex-Partner: No    Emotionally Abused: No    Physically Abused: No    Sexually Abused: No    Family History  Problem Relation Age of Onset   Diabetes Brother     Past Surgical History:  Procedure Laterality Date   NO PAST SURGERIES      ROS: Review of Systems Negative except as stated above  PHYSICAL EXAM: BP 128/82   Pulse 80   Temp 98.2 F (36.8 C) (Oral)   Ht 5' 1.5" (1.562 m)   Wt 155 lb 3.2 oz (70.4 kg)   SpO2 98%   BMI 28.85 kg/m   Physical Exam HENT:     Head: Normocephalic and atraumatic.     Nose: Nose normal.     Mouth/Throat:     Mouth: Mucous  membranes are moist.     Pharynx: Oropharynx is clear.  Eyes:     Extraocular Movements: Extraocular movements intact.     Conjunctiva/sclera: Conjunctivae normal.     Pupils: Pupils are equal, round, and reactive to light.  Cardiovascular:     Rate and Rhythm: Normal rate and regular rhythm.     Pulses: Normal pulses.     Heart sounds: Normal heart sounds.  Pulmonary:     Effort: Pulmonary effort is normal.     Breath sounds: Normal breath sounds.  Musculoskeletal:        General: Normal range of motion.     Cervical back: Normal range of motion and neck supple.  Neurological:     General: No focal deficit present.     Mental Status: She is alert and oriented to person, place, and time.  Psychiatric:        Mood and Affect:  Mood normal.        Behavior: Behavior normal.     ASSESSMENT AND PLAN: 1. Primary hypertension (Primary) - Continue Lisinopril as prescribed.  - Routine screening.  - Counseled on blood pressure goal of less than 130/80, low-sodium, DASH diet, medication compliance, and 150 minutes of moderate intensity exercise per week as tolerated. Counseled on medication adherence and adverse effects. - Follow-up with primary provider in 3 months or sooner if needed. - lisinopril (ZESTRIL) 10 MG tablet; Take 1 tablet (10 mg total) by mouth daily.  Dispense: 90 tablet; Refill: 0 - Basic Metabolic Panel  2. Hyperlipidemia, unspecified hyperlipidemia type - Continue Atorvastatin as prescribed. Counseled on medication adherence/adverse effects. - Routine screening.  - Follow-up with primary provider as scheduled. - atorvastatin (LIPITOR) 20 MG tablet; Take 1 tablet (20 mg total) by mouth daily.  Dispense: 90 tablet; Refill: 0 - Lipid panel  3. Prediabetes - Routine screening.  - Hemoglobin A1c  4. Language barrier - [facility]. ID#: [id number]       Patient was given the opportunity to ask questions.  Patient verbalized understanding of the plan and was able to repeat key elements of the plan. Patient was given clear instructions to go to Emergency Department or return to medical center if symptoms don't improve, worsen, or new problems develop.The patient verbalized understanding.   Orders Placed This Encounter  Procedures   Basic Metabolic Panel   Lipid panel   Hemoglobin A1c     Requested Prescriptions   Signed Prescriptions Disp Refills   atorvastatin (LIPITOR) 20 MG tablet 90 tablet 0    Sig: Take 1 tablet (20 mg total) by mouth daily.   lisinopril (ZESTRIL) 10 MG tablet 90 tablet 0    Sig: Take 1 tablet (10 mg total) by mouth daily.    Return in about 3 months (around 10/11/2023) for Follow-Up or next available chronic conditions.  Rema Fendt, NP

## 2023-07-12 ENCOUNTER — Other Ambulatory Visit: Payer: Self-pay

## 2023-07-12 ENCOUNTER — Other Ambulatory Visit: Payer: Self-pay | Admitting: Family

## 2023-07-12 DIAGNOSIS — E785 Hyperlipidemia, unspecified: Secondary | ICD-10-CM

## 2023-07-12 LAB — BASIC METABOLIC PANEL
BUN/Creatinine Ratio: 20 (ref 9–23)
BUN: 15 mg/dL (ref 6–20)
CO2: 20 mmol/L (ref 20–29)
Calcium: 9.4 mg/dL (ref 8.7–10.2)
Chloride: 105 mmol/L (ref 96–106)
Creatinine, Ser: 0.74 mg/dL (ref 0.57–1.00)
Glucose: 85 mg/dL (ref 70–99)
Potassium: 4.3 mmol/L (ref 3.5–5.2)
Sodium: 138 mmol/L (ref 134–144)
eGFR: 107 mL/min/{1.73_m2} (ref >59)

## 2023-07-12 LAB — HEMOGLOBIN A1C
Est. average glucose Bld gHb Est-mCnc: 117 mg/dL
Hgb A1c MFr Bld: 5.7 % — ABNORMAL HIGH (ref 4.8–5.6)

## 2023-07-12 LAB — LIPID PANEL
Chol/HDL Ratio: 4.7 ratio — ABNORMAL HIGH (ref 0.0–4.4)
Cholesterol, Total: 224 mg/dL — ABNORMAL HIGH (ref 100–199)
HDL: 48 mg/dL (ref >39)
LDL Chol Calc (NIH): 139 mg/dL — ABNORMAL HIGH (ref 0–99)
Triglycerides: 205 mg/dL — ABNORMAL HIGH (ref 0–149)
VLDL Cholesterol Cal: 37 mg/dL (ref 5–40)

## 2023-07-12 MED ORDER — ATORVASTATIN CALCIUM 40 MG PO TABS
40.0000 mg | ORAL_TABLET | Freq: Every day | ORAL | 0 refills | Status: DC
  Filled 2023-07-12: qty 90, 90d supply, fill #0

## 2023-10-12 ENCOUNTER — Encounter: Payer: Self-pay | Admitting: Family

## 2023-10-12 ENCOUNTER — Other Ambulatory Visit: Payer: Self-pay

## 2023-10-12 ENCOUNTER — Ambulatory Visit (INDEPENDENT_AMBULATORY_CARE_PROVIDER_SITE_OTHER): Payer: Self-pay | Admitting: Family

## 2023-10-12 VITALS — BP 113/77 | HR 74 | Temp 98.4°F | Resp 16 | Ht 61.5 in | Wt 153.0 lb

## 2023-10-12 DIAGNOSIS — E785 Hyperlipidemia, unspecified: Secondary | ICD-10-CM

## 2023-10-12 DIAGNOSIS — I10 Essential (primary) hypertension: Secondary | ICD-10-CM

## 2023-10-12 DIAGNOSIS — Z603 Acculturation difficulty: Secondary | ICD-10-CM

## 2023-10-12 MED ORDER — ATORVASTATIN CALCIUM 40 MG PO TABS
40.0000 mg | ORAL_TABLET | Freq: Every day | ORAL | 0 refills | Status: DC
  Filled 2023-10-12: qty 90, 90d supply, fill #0

## 2023-10-12 MED ORDER — LISINOPRIL 10 MG PO TABS
10.0000 mg | ORAL_TABLET | Freq: Every day | ORAL | 0 refills | Status: DC
  Filled 2023-10-12: qty 90, 90d supply, fill #0

## 2023-10-12 NOTE — Progress Notes (Signed)
 Patient ID: Alexis Potts, female    DOB: November 23, 1985  MRN: [id number]  CC: Chronic Conditions Follow-Up  Subjective: Alexis Potts is a 38 y.o. female who presents for chronic conditions follow-up.   Her concerns today include:  - Doing well on Lisinopril , no issues/concerns. She does not complain of red flag symptoms such as but not limited to chest pain, shortness of breath, worst headache of life, nausea/vomiting.  - Doing well on Atorvastatin , no issues/concerns.  Patient Active Problem List   Diagnosis Date Noted   Prediabetes 01/11/2023   Hyperlipidemia 07/08/2022   Vaginal delivery 04/09/2021   Pre-existing essential hypertension during pregnancy in third trimester 02/10/2021   Supervision of high risk pregnancy, antepartum 10/13/2020   Chronic hypertension 10/13/2020   Language barrier 08/13/2019     No current outpatient medications on file prior to visit.   No current facility-administered medications on file prior to visit.    No Known Allergies  Social History   Socioeconomic History   Marital status: Single    Spouse name: Not on file   Number of children: Not on file   Years of education: Not on file   Highest education level: Not on file  Occupational History   Not on file  Tobacco Use   Smoking status: Never    Passive exposure: Never   Smokeless tobacco: Never  Vaping Use   Vaping status: Never Used  Substance and Sexual Activity   Alcohol use: Not Currently   Drug use: Never   Sexual activity: Yes    Birth control/protection: None  Other Topics Concern   Not on file  Social History Narrative   Not on file   Social Drivers of Health   Financial Resource Strain: Low Risk  (07/11/2023)   Overall Financial Resource Strain (CARDIA)    Difficulty of Paying Living Expenses: Not very hard  Food Insecurity: No Food Insecurity (10/12/2023)   Hunger Vital Sign    Worried About Running Out of Food in the Last Year: Never  true    Ran Out of Food in the Last Year: Never true  Transportation Needs: No Transportation Needs (10/12/2023)   PRAPARE - Administrator, Civil Service (Medical): No    Lack of Transportation (Non-Medical): No  Physical Activity: Insufficiently Active (07/11/2023)   Exercise Vital Sign    Days of Exercise per Week: 3 days    Minutes of Exercise per Session: 20 min  Stress: No Stress Concern Present (10/12/2023)   [employer] of Occupational Health - Occupational Stress Questionnaire    Feeling of Stress: Not at all  Social Connections: Moderately Isolated (10/12/2023)   Social Connection and Isolation Panel    Frequency of Communication with Friends and Family: More than three times a week    Frequency of Social Gatherings with Friends and Family: More than three times a week    Attends Religious Services: 1 to 4 times per year    Active Member of [employer] or Organizations: No    Attends Banker Meetings: Never    Marital Status: Never married  Intimate Partner Violence: Not At Risk (10/12/2023)   Humiliation, Afraid, Rape, and Kick questionnaire    Fear of Current or Ex-Partner: No    Emotionally Abused: No    Physically Abused: No    Sexually Abused: No    Family History  Problem Relation Age of Onset   Diabetes Brother     Past Surgical History:  Procedure Laterality Date   NO PAST SURGERIES      ROS: Review of Systems Negative except as stated above  PHYSICAL EXAM: BP 113/77   Pulse 74   Temp 98.4 F (36.9 C) (Oral)   Resp 16   Ht 5' 1.5 (1.562 m)   Wt 153 lb (69.4 kg)   LMP 10/07/2023 (Exact Date)   SpO2 98%   BMI 28.44 kg/m   Physical Exam HENT:     Head: Normocephalic and atraumatic.     Nose: Nose normal.     Mouth/Throat:     Mouth: Mucous membranes are moist.     Pharynx: Oropharynx is clear.   Eyes:     Extraocular Movements: Extraocular movements intact.     Conjunctiva/sclera: Conjunctivae normal.     Pupils:  Pupils are equal, round, and reactive to light.    Cardiovascular:     Rate and Rhythm: Normal rate and regular rhythm.     Pulses: Normal pulses.     Heart sounds: Normal heart sounds.  Pulmonary:     Effort: Pulmonary effort is normal.     Breath sounds: Normal breath sounds.   Musculoskeletal:        General: Normal range of motion.     Cervical back: Normal range of motion and neck supple.   Neurological:     General: No focal deficit present.     Mental Status: She is alert and oriented to person, place, and time.   Psychiatric:        Mood and Affect: Mood normal.        Behavior: Behavior normal.     ASSESSMENT AND PLAN: 1. Primary hypertension (Primary) - Continue Lisinopril  as prescribed.  - Counseled on blood pressure goal of less than 130/80, low-sodium, DASH diet, medication compliance, and 150 minutes of moderate intensity exercise per week as tolerated. Counseled on medication adherence and adverse effects. - Follow-up with primary provider in 3 months or sooner if needed.  - lisinopril  (ZESTRIL ) 10 MG tablet; Take 1 tablet (10 mg total) by mouth daily.  Dispense: 90 tablet; Refill: 0  2. Hyperlipidemia, unspecified hyperlipidemia type - Continue Atorvastatin  as prescribed. Counseled on medication adherence/adverse effects.  - Follow-up with primary provider in 3 months or sooner if needed. - atorvastatin  (LIPITOR) 40 MG tablet; Take 1 tablet (40 mg total) by mouth daily.  Dispense: 90 tablet; Refill: 0  3. Language barrier - [facility]. ID#:  [id number]   Patient was given the opportunity to ask questions.  Patient verbalized understanding of the plan and was able to repeat key elements of the plan. Patient was given clear instructions to go to Emergency Department or return to medical center if symptoms don't improve, worsen, or new problems develop.The patient verbalized understanding.   Requested Prescriptions   Signed Prescriptions Disp Refills    lisinopril  (ZESTRIL ) 10 MG tablet 90 tablet 0    Sig: Take 1 tablet (10 mg total) by mouth daily.   atorvastatin  (LIPITOR) 40 MG tablet 90 tablet 0    Sig: Take 1 tablet (40 mg total) by mouth daily.    Return in about 3 months (around 01/12/2024) for Follow-Up or next available chronic conditions.  Greig JINNY Drones, NP

## 2023-10-12 NOTE — Progress Notes (Signed)
 3 month follow-up

## 2023-10-14 ENCOUNTER — Other Ambulatory Visit: Payer: Self-pay

## 2024-01-10 ENCOUNTER — Ambulatory Visit: Payer: Self-pay | Admitting: Family

## 2024-02-17 ENCOUNTER — Other Ambulatory Visit: Payer: Self-pay

## 2024-02-17 ENCOUNTER — Encounter (HOSPITAL_COMMUNITY): Payer: Self-pay

## 2024-02-17 ENCOUNTER — Ambulatory Visit: Payer: Self-pay | Admitting: Family

## 2024-02-17 ENCOUNTER — Ambulatory Visit (HOSPITAL_COMMUNITY)
Admission: EM | Admit: 2024-02-17 | Discharge: 2024-02-17 | Disposition: A | Discharge status: Home / Self Care | Payer: Self-pay | Attending: Emergency Medicine | Admitting: Emergency Medicine

## 2024-02-17 DIAGNOSIS — J209 Acute bronchitis, unspecified: Secondary | ICD-10-CM

## 2024-02-17 DIAGNOSIS — J302 Other seasonal allergic rhinitis: Secondary | ICD-10-CM

## 2024-02-17 MED ORDER — PROMETHAZINE-DM 6.25-15 MG/5ML PO SYRP
5.0000 mL | ORAL_SOLUTION | Freq: Every evening | ORAL | 0 refills | Status: DC | PRN
  Filled 2024-02-17: qty 60, 12d supply, fill #0

## 2024-02-17 MED ORDER — AZITHROMYCIN 250 MG PO TABS
ORAL_TABLET | ORAL | 0 refills | Status: AC
  Filled 2024-02-17: qty 6, 5d supply, fill #0

## 2024-02-17 MED ORDER — TRIAMCINOLONE ACETONIDE 40 MG/ML IJ SUSP
INTRAMUSCULAR | Status: AC
  Filled 2024-02-17: qty 2

## 2024-02-17 MED ORDER — CETIRIZINE HCL 10 MG PO TABS
10.0000 mg | ORAL_TABLET | Freq: Every day | ORAL | 2 refills | Status: AC
  Filled 2024-02-17: qty 30, 30d supply, fill #0
  Filled 2024-03-22: qty 30, 30d supply, fill #1

## 2024-02-17 MED ORDER — TRIAMCINOLONE ACETONIDE 40 MG/ML IJ SUSP
60.0000 mg | Freq: Once | INTRAMUSCULAR | Status: AC
  Administered 2024-02-17: 60 mg via INTRAMUSCULAR

## 2024-02-17 MED ORDER — ALBUTEROL SULFATE HFA 108 (90 BASE) MCG/ACT IN AERS
2.0000 | INHALATION_SPRAY | Freq: Four times a day (QID) | RESPIRATORY_TRACT | 2 refills | Status: AC | PRN
  Filled 2024-02-17: qty 6.7, 25d supply, fill #0
  Filled 2024-04-13: qty 6.7, 25d supply, fill #1

## 2024-02-17 NOTE — ED Triage Notes (Signed)
 Per interpreter, pt c/o cough and bilateral ear pain x1wk. Taking Desylen and mucinex with little relief.

## 2024-02-17 NOTE — Discharge Instructions (Addendum)
 Lea a continuacin para obtener ms informacin sobre los medicamentos, las dosis y las frecuencias que recomiendo para ayudar a paramedic sus sntomas y que se sienta mejor pronto:  Kenalog IM (triamcinolona): Para tratar rpidamente su importante inflamacin respiratoria, hoy le administramos una inyeccin de Kenalog en la consulta. Debera seguir sintiendo careers adviser completo del esteroide durante las prximas 24 a 36 horas.  Z-Pak (azitromicina): Para prevenir infecciones bacterianas en los pulmones, tome dos (2) comprimidos film/video editor y un comprimido diario a glass blower/designer de museum/gallery curator. Este antibitico puede causar malestar estomacal, que desaparecer una vez finalizado el [place]. Puede tomar probiticos, comer yogur o tomar Imodium mientras toma este medicamento. Evite otros medicamentos sistmicos como Maalox, Pepto-Bismol o leche de magnesia, ya que pueden interferir con la absorcin del antibitico.  Zyrtec (cetirizina): Este es un excelente antihistamnico de segunda generacin que ayuda a reducir la respuesta inflamatoria respiratoria a los alrgenos [place]. En algunos pacientes, este medicamento puede causar somnolencia diurna, por lo que recomiendo que tome 1 comprimido diario al d.[employer]  ProAir, Ventolin, Proventil (salbutamol): Este medicamento inhalado contiene un broncodilatador agonista beta de accin corta. Este medicamento relaja el msculo liso de las vas respiratorias en los pulmones. Cuando estos msculos estn tensos, la respiracin se dificulta. El resultado de la relajacin del msculo liso es un mayor flujo de aire y una mejor respiracin. Este es un medicamento de accin corta que se puede usar cada 4-6 horas segn sea necesario para la dificultad para respirar, la falta de aire, las sibilancias y la tos excesiva. Viene en forma de inhalador o solucin para nebulizador. Recomiendo que [employer] prximos 3 a 4 [place], use este medicamento 4  veces al da de forma programada y luego disminuya a pharmacist, hospital al da y segn sea necesario hasta que los sntomas hayan desaparecido por completo, lo que preveo que ser en varias semanas.  Prometazina DM: La prometazina es un descongestionante nasal que seca las [place] mucosas y un medicamento contra las nuseas. La prometazina suele causar bastante somnolencia en la mgm mirage. DM es dextrometorfano, un antitusivo que se encuentra en muchos medicamentos para la tos de venta libre y preparados combinados para el resfriado. Por favor, tome 5 ml antes de acostarse para minimizar la tos, lo que le ayudar a tourist information centre manager. Le he enviado una receta para este medicamento a su farmacia, ya que no se puede comprar sin receta.  Si los sntomas no han mejorado significativamente en los prximos 5 a 7 das, por favor, vuelva para una nueva evaluacin o consulte con su mdico habitual. Si los sntomas empeoran en los prximos 3 a 5 das, acuda a la sala de emergencias para una evaluacin adicional.  Gracias por visitar el servicio de urgencias hoy. Agradecemos la oportunidad de participar en su atencin mdica.   Please read below to learn more about the medications, dosages and frequencies that I recommend to help alleviate your symptoms and to get you feeling better soon:   Z-Pak (azithromycin):  To prevent bacterial infection in the lungs, please take two (2) tablets on day one and one tablet daily thereafter until the prescription is complete.This antibiotic can cause upset stomach, this will resolve once antibiotics are complete.  You are welcome to take a probiotic, eat yogurt, take Imodium while taking this medication.  Please avoid other systemic medications such as Maalox, Pepto-Bismol or milk of magnesia as they can interfere with the body's ability to absorb the antibiotics.  Kenalog IM (triamcinolone):  To quickly address your significant respiratory inflammation, you were  provided with an injection of Kenalog in the office today.  You should continue to feel the full benefit of the steroid for the next 24-36 hours.                 Zyrtec (cetirizine): This is an excellent second-generation antihistamine that helps to reduce respiratory inflammatory response to environmental allergens.  In some patients, this medication can cause daytime sleepiness so I recommend that you take 1 tablet daily at bedtime.                                 ProAir, Ventolin, Proventil (albuterol): This inhaled medication contains a short acting beta agonist bronchodilator.  This medication relaxes the smooth muscle of the airway in the lungs.  When these muscles are tight, breathing becomes more constricted.  The result of relaxation of the smooth muscle is increased air movement and improved work of breathing.  This is a short acting medication that can be used every 4-6 hours as needed for increased work of breathing, shortness of breath, wheezing and excessive coughing.  It comes in the form of a handheld inhaler or nebulizer solution.  I recommended that for the next 3 to 4 days, this medication is used 4 times daily on a scheduled basis then decrease to twice daily and as needed until symptoms have completely resolved which I anticipate will be several weeks.        Promethazine DM: Promethazine is both a nasal decongestant that dries up mucous membranes and an antinausea medication.  Promethazine often makes most patients feel fairly sleepy.  DM is dextromethorphan, a single symptom reliever which is a cough suppressant found in many over-the-counter cough medications and combination cold preparations.  Please take 5 mL before bedtime to minimize your cough which will help you sleep better.  I have sent a prescription for this medication to your pharmacy because it cannot be purchased over-the-counter.   If symptoms have not meaningfully improved in the next 5 to 7 days, please return for  repeat evaluation or follow-up with your regular provider.  If symptoms have worsened in the next 3 to 5 days, please go to the emergency room for further evaluation.    Thank you for visiting urgent care today.  We appreciate the opportunity to participate in your care.

## 2024-02-17 NOTE — ED Provider Notes (Signed)
 [facility]    CSN: [id number] Arrival date & time: 02/17/24  9052    HISTORY   Chief Complaint  Patient presents with   Cough   HPI Alexis Potts is a pleasant, 38 y.o. female who presents to urgent care today. Patient complains of a 1 week history of nonproductive cough and bilateral otalgia.  Patient states she has been taking Delsym and Mucinex without relief.  Endorses a history of seasonal allergies, not currently taking any allergy medications.  States she has had bronchitis in the past but does not get it every year.  States she usually gets sick every year when the weather turns cold.  Patient denies fever, chills, body aches.  Patient endorses nasal congestion and rhinorrhea but states these have improved over the week while the cough has gotten worse.  Patient denies hearing loss, drainage from either ear.  The history is provided by the patient.  Cough  Past Medical History:  Diagnosis Date   Anemia    Hypertension    SAB (spontaneous abortion) 06/22/2018   Patient Active Problem List   Diagnosis Date Noted   Prediabetes 01/11/2023   Hyperlipidemia 07/08/2022   Vaginal delivery 04/09/2021   Pre-existing essential hypertension during pregnancy in third trimester 02/10/2021   Supervision of high risk pregnancy, antepartum 10/13/2020   Chronic hypertension 10/13/2020   Language barrier 08/13/2019   Past Surgical History:  Procedure Laterality Date   NO PAST SURGERIES     OB History     Gravida  4   Para  3   Term  3   Preterm  0   AB  1   Living  3      SAB  1   IAB  0   Ectopic  0   Multiple  0   Live Births  3          Home Medications    Prior to Admission medications   Medication Sig Start Date End Date Taking? Authorizing Provider  atorvastatin  (LIPITOR) 40 MG tablet Take 1 tablet (40 mg total) by mouth daily. 10/12/23 01/12/24  Lorren Greig PARAS, NP  lisinopril  (ZESTRIL ) 10 MG tablet Take 1 tablet (10 mg  total) by mouth daily. 10/12/23 01/12/24  Lorren Greig PARAS, NP    Family History Family History  Problem Relation Age of Onset   Diabetes Brother    Social History Social History   Tobacco Use   Smoking status: Never    Passive exposure: Never   Smokeless tobacco: Never  Vaping Use   Vaping status: Never Used  Substance Use Topics   Alcohol use: Not Currently   Drug use: Never   Allergies   Patient has no known allergies.  Review of Systems Review of Systems  Respiratory:  Positive for cough.    Pertinent findings revealed after performing a 14 point review of systems has been noted in the history of present illness.  Physical Exam Vital Signs BP (!) 135/92 (BP Location: Right Arm)   Pulse 94   Temp 99 F (37.2 C) (Oral)   Resp 16   LMP 02/17/2024 (Approximate)   SpO2 96%   No data found.  Physical Exam Vitals and nursing note reviewed.  Constitutional:      General: She is awake. She is not in acute distress.    Appearance: Normal appearance. She is well-developed and well-groomed. She is not ill-appearing.  HENT:     Head: Normocephalic and atraumatic.  Salivary Glands: Right salivary gland is not diffusely enlarged or tender. Left salivary gland is not diffusely enlarged or tender.     Right Ear: Hearing, ear canal and external ear normal. A middle ear effusion is present. Tympanic membrane is bulging. Tympanic membrane is not injected or erythematous.     Left Ear: Hearing, ear canal and external ear normal. A middle ear effusion is present. Tympanic membrane is bulging. Tympanic membrane is not injected or erythematous.     Ears:     Comments: Bilateral EACs normal, both TMs bulging with clear fluid    Nose: Rhinorrhea present. No nasal deformity, septal deviation, signs of injury or nasal tenderness. Rhinorrhea is clear.     Right Nostril: Occlusion present. No foreign body, epistaxis or septal hematoma.     Left Nostril: Occlusion present. No foreign  body, epistaxis or septal hematoma.     Right Turbinates: Enlarged, swollen and pale.     Left Turbinates: Enlarged, swollen and pale.     Right Sinus: No maxillary sinus tenderness or frontal sinus tenderness.     Left Sinus: No maxillary sinus tenderness or frontal sinus tenderness.     Mouth/Throat:     Lips: Pink. No lesions.     Mouth: Mucous membranes are moist. No oral lesions.     Tongue: No lesions. Tongue does not deviate from midline.     Palate: No mass and lesions.     Pharynx: Oropharynx is clear. Uvula midline. Postnasal drip present. No pharyngeal swelling, oropharyngeal exudate, posterior oropharyngeal erythema or uvula swelling.     Tonsils: No tonsillar exudate. 0 on the right. 0 on the left.     Comments: Postnasal drip Eyes:     General: Lids are normal.        Right eye: No discharge.        Left eye: No discharge.     Conjunctiva/sclera: Conjunctivae normal.     Right eye: Right conjunctiva is not injected.     Left eye: Left conjunctiva is not injected.  Neck:     Trachea: Trachea and phonation normal.  Cardiovascular:     Rate and Rhythm: Normal rate and regular rhythm.  Pulmonary:     Effort: Pulmonary effort is normal.     Breath sounds: Examination of the right-lower field reveals rhonchi. Rhonchi present.  Chest:     Chest wall: No tenderness.  Musculoskeletal:        General: Normal range of motion.     Cervical back: Full passive range of motion without pain, normal range of motion and neck supple. Normal range of motion.  Lymphadenopathy:     Cervical: No cervical adenopathy.  Skin:    General: Skin is warm and dry.     Findings: No erythema or rash.  Neurological:     General: No focal deficit present.     Mental Status: She is alert and oriented to person, place, and time. Mental status is at baseline.  Psychiatric:        Attention and Perception: Attention and perception normal.        Mood and Affect: Mood and affect normal.         Speech: Speech normal.        Behavior: Behavior normal. Behavior is cooperative.        Thought Content: Thought content normal.     Visual Acuity Right Eye Distance:   Left Eye Distance:   Bilateral Distance:  Right Eye Near:   Left Eye Near:    Bilateral Near:     UC Couse / Diagnostics / Procedures:     Radiology No results found.  Procedures Procedures (including critical care time) EKG  Pending results:  Labs Reviewed - No data to display  Medications Ordered in UC: Medications  triamcinolone acetonide (KENALOG-40) injection 60 mg (60 mg Intramuscular Given 02/17/24 1056)    UC Diagnoses / Final Clinical Impressions(s)   I have reviewed the triage vital signs and the nursing notes.  Pertinent labs & imaging results that were available during my care of the patient were reviewed by me and considered in my medical decision making (see chart for details).    Final diagnoses:  Acute bronchitis, unspecified organism  Seasonal allergies  Due to duration of symptoms, viral testing deferred.  Patient provided with a steroid injection during her visit to calm bronchial inflammation, advised to begin Zyrtec and albuterol for relief of cough.  Patient provided with azithromycin given elevated temperature today and duration of cough.  Patient was encouraged to begin taking allergy medicine September every year before the weather gets cold and continue it throughout December, prescription provided.  Patient provided promethazine DM for nighttime cough, states she did not get any sleep at night secondary to cough.  Conservative care recommended.  Return precautions advised.  Please see discharge instructions below for details of plan of care as provided to patient. ED Prescriptions     Medication Sig Dispense Auth. Provider   cetirizine (ZYRTEC ALLERGY) 10 MG tablet Take 1 tablet (10 mg total) by mouth at bedtime. 30 tablet Joesph Shaver Scales, PA-C   albuterol (VENTOLIN  HFA) 108 (90 Base) MCG/ACT inhaler Inhale 2 puffs into the lungs every 6 (six) hours as needed for wheezing or shortness of breath (Cough). 18 g Joesph Shaver Scales, PA-C   azithromycin (ZITHROMAX) 250 MG tablet Take 2 tablets (500 mg total) by mouth daily for 1 day, THEN 1 tablet (250 mg total) daily for 4 days. 6 tablet Joesph Shaver Scales, PA-C   promethazine-dextromethorphan (PROMETHAZINE-DM) 6.25-15 MG/5ML syrup Take 5 mLs by mouth at bedtime as needed for cough. 60 mL Joesph Shaver Scales, PA-C      PDMP not reviewed this encounter.  Pending results:  Labs Reviewed - No data to display    Discharge Instructions      Lea a continuacin para obtener ms informacin [employer], las dosis y las frecuencias que recomiendo para ayudar a paramedic sus sntomas y que se sienta mejor pronto:  Kenalog IM (triamcinolona): Para tratar rpidamente su importante inflamacin respiratoria, hoy le administramos una inyeccin de Kenalog en la consulta. Debera seguir sintiendo careers adviser completo del esteroide durante las prximas 24 a 36 horas.  Z-Pak (azitromicina): Para prevenir infecciones bacterianas en los pulmones, tome dos (2) comprimidos film/video editor y un comprimido diario a glass blower/designer de museum/gallery curator. Este antibitico puede causar malestar estomacal, que desaparecer una vez finalizado el [place]. Puede tomar probiticos, comer yogur o tomar Imodium mientras toma este medicamento. Evite otros medicamentos sistmicos como Maalox, Pepto-Bismol o leche de magnesia, ya que pueden interferir con la absorcin del antibitico.  Zyrtec (cetirizina): Este es un excelente antihistamnico de segunda generacin que ayuda a reducir la respuesta inflamatoria respiratoria a los alrgenos [place]. En algunos pacientes, este medicamento puede causar somnolencia diurna, por lo que recomiendo que tome 1 comprimido diario al d.r. [employer]  ProAir, Ventolin, Proventil  (salbutamol): Este medicamento inhalado  contiene un broncodilatador agonista beta de accin corta. Este medicamento relaja el msculo liso de las vas respiratorias en los pulmones. Cuando estos msculos estn tensos, la respiracin se dificulta. El resultado de la relajacin del msculo liso es un mayor flujo de aire y una mejor respiracin. Este es un medicamento de accin corta que se puede usar cada 4-6 horas segn sea necesario para la dificultad para respirar, la falta de aire, las sibilancias y la tos excesiva. Viene en forma de inhalador o solucin para nebulizador. Recomiendo que energy [employer] partners prximos 3 a 4 [place], use este medicamento 4 veces al da de forma programada y luego disminuya a pharmacist, hospital al da y segn sea necesario hasta que los sntomas hayan desaparecido por completo, lo que preveo que ser en varias semanas.  Prometazina DM: La prometazina es un descongestionante nasal que seca las [place] mucosas y un medicamento contra las nuseas. La prometazina suele causar bastante somnolencia en la mgm mirage. DM es dextrometorfano, un antitusivo que se encuentra en muchos medicamentos para la tos de venta libre y preparados combinados para el resfriado. Por favor, tome 5 ml antes de acostarse para minimizar la tos, lo que le ayudar a tourist information centre manager. Le he enviado una receta para este medicamento a su farmacia, ya que no se puede comprar sin receta.  Si los sntomas no han mejorado significativamente en los prximos 5 a 7 das, por favor, vuelva para una nueva evaluacin o consulte con su mdico habitual. Si los sntomas empeoran en los prximos 3 a 5 das, acuda a la sala de emergencias para una evaluacin adicional.  Gracias por visitar el servicio de urgencias hoy. Agradecemos la oportunidad de participar en su atencin mdica.   Please read below to learn more about the medications, dosages and frequencies that I recommend to help alleviate your symptoms and to get you  feeling better soon:   Z-Pak (azithromycin):  To prevent bacterial infection in the lungs, please take two (2) tablets on day one and one tablet daily thereafter until the prescription is complete.This antibiotic can cause upset stomach, this will resolve once antibiotics are complete.  You are welcome to take a probiotic, eat yogurt, take Imodium while taking this medication.  Please avoid other systemic medications such as Maalox, Pepto-Bismol or milk of magnesia as they can interfere with the body's ability to absorb the antibiotics.       Kenalog IM (triamcinolone):  To quickly address your significant respiratory inflammation, you were provided with an injection of Kenalog in the office today.  You should continue to feel the full benefit of the steroid for the next 24-36 hours.                 Zyrtec (cetirizine): This is an excellent second-generation antihistamine that helps to reduce respiratory inflammatory response to environmental allergens.  In some patients, this medication can cause daytime sleepiness so I recommend that you take 1 tablet daily at bedtime.                                 ProAir, Ventolin, Proventil (albuterol): This inhaled medication contains a short acting beta agonist bronchodilator.  This medication relaxes the smooth muscle of the airway in the lungs.  When these muscles are tight, breathing becomes more constricted.  The result of relaxation of the smooth muscle is increased air movement and improved work of breathing.  This  is a short acting medication that can be used every 4-6 hours as needed for increased work of breathing, shortness of breath, wheezing and excessive coughing.  It comes in the form of a handheld inhaler or nebulizer solution.  I recommended that for the next 3 to 4 days, this medication is used 4 times daily on a scheduled basis then decrease to twice daily and as needed until symptoms have completely resolved which I anticipate will be several  weeks.        Promethazine DM: Promethazine is both a nasal decongestant that dries up mucous membranes and an antinausea medication.  Promethazine often makes most patients feel fairly sleepy.  DM is dextromethorphan, a single symptom reliever which is a cough suppressant found in many over-the-counter cough medications and combination cold preparations.  Please take 5 mL before bedtime to minimize your cough which will help you sleep better.  I have sent a prescription for this medication to your pharmacy because it cannot be purchased over-the-counter.   If symptoms have not meaningfully improved in the next 5 to 7 days, please return for repeat evaluation or follow-up with your regular provider.  If symptoms have worsened in the next 3 to 5 days, please go to the emergency room for further evaluation.    Thank you for visiting urgent care today.  We appreciate the opportunity to participate in your care.       Disposition Upon Discharge:  Condition: stable for discharge home  Patient presented with an acute illness with associated systemic symptoms and significant discomfort requiring urgent management. In my opinion, this is a condition that a prudent lay person (someone who possesses an average knowledge of health and medicine) may potentially expect to result in complications if not addressed urgently such as respiratory distress, impairment of bodily function or dysfunction of bodily organs.   Routine symptom specific, illness specific and/or disease specific instructions were discussed with the patient and/or caregiver at length.   As such, the patient has been evaluated and assessed, work-up was performed and treatment was provided in alignment with urgent care protocols and evidence based medicine.  Patient/parent/caregiver has been advised that the patient may require follow up for further testing and treatment if the symptoms continue in spite of treatment, as clinically indicated  and appropriate.  Patient/parent/caregiver has been advised to return to the [facility] or PCP if no better; to PCP or the Emergency Department if new signs and symptoms develop, or if the current signs or symptoms continue to change or worsen for further workup, evaluation and treatment as clinically indicated and appropriate  The patient will follow up with their current PCP if and as advised. If the patient does not currently have a PCP we will assist them in obtaining one.   The patient may need specialty follow up if the symptoms continue, in spite of conservative treatment and management, for further workup, evaluation, consultation and treatment as clinically indicated and appropriate.  Patient/parent/caregiver verbalized understanding and agreement of plan as discussed.  All questions were addressed during visit.  Please see discharge instructions below for further details of plan.  This office note has been dictated using Teaching laboratory technician.  Unfortunately, this method of dictation can sometimes lead to typographical or grammatical errors.  I apologize for your inconvenience in advance if this occurs.  Please do not hesitate to reach out to me if clarification is needed.      Joesph Shaver Scales, PA-C 02/17/24 1109

## 2024-02-17 NOTE — Telephone Encounter (Signed)
 FYI Only or Action Required?: FYI only for provider: pt going to urgent care today.  Patient was last seen in primary care on 10/12/2023 by Lorren Greig PARAS, NP.  Called Nurse Triage reporting Cough.  Symptoms began several days ago.  Symptoms are: gradually worsening.  Triage Disposition: See Physician Within 24 Hours  Patient/caregiver understands and will follow disposition?: Yes            Copied from CRM #[id number]. Topic: Clinical - Red Word Triage >> Feb 17, 2024  8:21 AM Amy B wrote: Red Word that prompted transfer to Nurse Triage: Worsening dry cough with stomach pain, ear pain Reason for Disposition  Earache is present  Answer Assessment - Initial Assessment Questions This RN spoke with pt using an Interpreter [phone], Name Curlee). Pt is going to urgent care today as no appointment availability in office.  Dry cough, started Mon and worsening Bilateral ear pain started Mon Stomach pain from excessive coughing  DIFFICULTY BREATHING: Are you having difficulty breathing? If Yes, ask: How bad is it? (e.g., mild, moderate, severe)      Denies  FEVER: Do you have a fever? If Yes, ask: What is your temperature, how was it measured, and when did it start?     Back on Sun-Mon pt thinks she had a fever, not anymore  OTHER SYMPTOMS: Do you have any other symptoms? (e.g., runny nose, chest pain)       Denies chest pain, a little runny nose  Protocols used: Cough - Acute Non-Productive-A-AH

## 2024-02-21 ENCOUNTER — Other Ambulatory Visit: Payer: Self-pay

## 2024-02-21 ENCOUNTER — Ambulatory Visit (INDEPENDENT_AMBULATORY_CARE_PROVIDER_SITE_OTHER): Payer: Self-pay | Admitting: Family

## 2024-02-21 ENCOUNTER — Encounter: Payer: Self-pay | Admitting: Family

## 2024-02-21 VITALS — BP 121/81 | HR 76 | Temp 98.4°F | Resp 16 | Ht 60.5 in | Wt 157.2 lb

## 2024-02-21 DIAGNOSIS — I10 Essential (primary) hypertension: Secondary | ICD-10-CM

## 2024-02-21 DIAGNOSIS — E785 Hyperlipidemia, unspecified: Secondary | ICD-10-CM

## 2024-02-21 DIAGNOSIS — Z603 Acculturation difficulty: Secondary | ICD-10-CM

## 2024-02-21 DIAGNOSIS — R21 Rash and other nonspecific skin eruption: Secondary | ICD-10-CM

## 2024-02-21 DIAGNOSIS — R7303 Prediabetes: Secondary | ICD-10-CM

## 2024-02-21 MED ORDER — ATORVASTATIN CALCIUM 40 MG PO TABS
40.0000 mg | ORAL_TABLET | Freq: Every day | ORAL | 0 refills | Status: AC
  Filled 2024-02-21: qty 90, 90d supply, fill #0

## 2024-02-21 MED ORDER — TRIAMCINOLONE ACETONIDE 0.025 % EX CREA
1.0000 | TOPICAL_CREAM | Freq: Two times a day (BID) | CUTANEOUS | 2 refills | Status: AC
  Filled 2024-02-21: qty 60, 30d supply, fill #0

## 2024-02-21 MED ORDER — LISINOPRIL 10 MG PO TABS
10.0000 mg | ORAL_TABLET | Freq: Every day | ORAL | 0 refills | Status: AC
  Filled 2024-02-21: qty 90, 90d supply, fill #0

## 2024-02-21 NOTE — Progress Notes (Signed)
 3 month follow up, place on her right cheek that gets inflammed

## 2024-02-21 NOTE — Progress Notes (Signed)
 Patient ID: Alexis Potts, female    DOB: [birth year]  MRN: [id number]  CC: Chronic Conditions Follow-Up  Subjective: Alexis Potts is a 38 y.o. female who presents for chronic conditions follow-up.  Her concerns today include:  - Doing well on Lisinopril , no issues/concerns. He does not complain of red flag symptoms such as but not limited to chest pain, shortness of breath, worst headache of life, nausea/vomiting.  - Doing well on Atorvastatin , no issues/concerns. - Prediabetes follow-up.  - States rash of face (right cheek) comes and goes. Denies red flag symptoms. Tried over-the-counter medication with minimal relief.   Patient Active Problem List   Diagnosis Date Noted   Prediabetes 01/11/2023   Hyperlipidemia 07/08/2022   Vaginal delivery 04/09/2021   Pre-existing essential hypertension during pregnancy in third trimester 02/10/2021   Supervision of high risk pregnancy, antepartum 10/13/2020   Chronic hypertension 10/13/2020   Language barrier 08/13/2019     Current Outpatient Medications on File Prior to Visit  Medication Sig Dispense Refill   albuterol (VENTOLIN HFA) 108 (90 Base) MCG/ACT inhaler Inhale 2 puffs into the lungs every 6 (six) hours as needed for wheezing or shortness of breath (Cough). 18 g 2   azithromycin (ZITHROMAX) 250 MG tablet Take 2 tablets (500 mg total) by mouth daily for 1 day, THEN 1 tablet (250 mg total) daily for 4 days. 6 tablet 0   cetirizine (ZYRTEC ALLERGY) 10 MG tablet Take 1 tablet (10 mg total) by mouth at bedtime. 30 tablet 2   promethazine-dextromethorphan (PROMETHAZINE-DM) 6.25-15 MG/5ML syrup Take 5 mLs by mouth at bedtime as needed for cough. 60 mL 0   No current facility-administered medications on file prior to visit.    No Known Allergies  Social History   Socioeconomic History   Marital status: Single    Spouse name: Not on file   Number of children: Not on file   Years of education: Not on file    Highest education level: Not on file  Occupational History   Not on file  Tobacco Use   Smoking status: Never    Passive exposure: Never   Smokeless tobacco: Never  Vaping Use   Vaping status: Never Used  Substance and Sexual Activity   Alcohol use: Not Currently   Drug use: Never   Sexual activity: Yes    Birth control/protection: None  Other Topics Concern   Not on file  Social History Narrative   Not on file   Social Drivers of Health   Financial Resource Strain: Low Risk  (07/11/2023)   Overall Financial Resource Strain (CARDIA)    Difficulty of Paying Living Expenses: Not very hard  Food Insecurity: No Food Insecurity (10/12/2023)   Hunger Vital Sign    Worried About Running Out of Food in the Last Year: Never true    Ran Out of Food in the Last Year: Never true  Transportation Needs: No Transportation Needs (10/12/2023)   PRAPARE - Administrator, Civil Service (Medical): No    Lack of Transportation (Non-Medical): No  Physical Activity: Insufficiently Active (07/11/2023)   Exercise Vital Sign    Days of Exercise per Week: 3 days    Minutes of Exercise per Session: 20 min  Stress: No Stress Concern Present (10/12/2023)   [employer] of Occupational Health - Occupational Stress Questionnaire    Feeling of Stress: Not at all  Social Connections: Moderately Isolated (10/12/2023)   Social Connection and Isolation Panel  Frequency of Communication with Friends and Family: More than three times a week    Frequency of Social Gatherings with Friends and Family: More than three times a week    Attends Religious Services: 1 to 4 times per year    Active Member of [employer] or Organizations: No    Attends Banker Meetings: Never    Marital Status: Never married  Intimate Partner Violence: Not At Risk (10/12/2023)   Humiliation, Afraid, Rape, and Kick questionnaire    Fear of Current or Ex-Partner: No    Emotionally Abused: No    Physically Abused: No     Sexually Abused: No    Family History  Problem Relation Age of Onset   Diabetes Brother     Past Surgical History:  Procedure Laterality Date   NO PAST SURGERIES      ROS: Review of Systems Negative except as stated above  PHYSICAL EXAM: BP 121/81   Pulse 76   Temp 98.4 F (36.9 C) (Oral)   Resp 16   Ht 5' 0.5 (1.537 m)   Wt 157 lb 3.2 oz (71.3 kg)   LMP 02/17/2024 (Approximate)   SpO2 98%   BMI 30.20 kg/m   Physical Exam HENT:     Head: Normocephalic and atraumatic.     Nose: Nose normal.     Mouth/Throat:     Mouth: Mucous membranes are moist.     Pharynx: Oropharynx is clear.  Eyes:     Extraocular Movements: Extraocular movements intact.     Conjunctiva/sclera: Conjunctivae normal.     Pupils: Pupils are equal, round, and reactive to light.  Cardiovascular:     Rate and Rhythm: Normal rate and regular rhythm.     Pulses: Normal pulses.     Heart sounds: Normal heart sounds.  Pulmonary:     Effort: Pulmonary effort is normal.     Breath sounds: Normal breath sounds.  Musculoskeletal:        General: Normal range of motion.     Cervical back: Normal range of motion and neck supple.  Skin:    General: Skin is warm and dry.     Findings: Rash present.  Neurological:     General: No focal deficit present.     Mental Status: She is alert and oriented to person, place, and time.  Psychiatric:        Mood and Affect: Mood normal.        Behavior: Behavior normal.     ASSESSMENT AND PLAN: 1. Primary hypertension (Primary) - Continue Lisinopril  as prescribed.  - Routine screening.  - Counseled on blood pressure goal of less than 130/80, low-sodium, DASH diet, medication compliance, and 150 minutes of moderate intensity exercise per week as tolerated. Counseled on medication adherence and adverse effects. - Follow-up with primary provider in 3 months or sooner if needed.  - Basic Metabolic Panel - lisinopril  (ZESTRIL ) 10 MG tablet; Take 1 tablet (10  mg total) by mouth daily.  Dispense: 90 tablet; Refill: 0  2. Hyperlipidemia, unspecified hyperlipidemia type - Continue Atorvastatin  as prescribed. Counseled on medication adherence/adverse effects.  - Routine screening.  - Follow-up with primary provider as scheduled.  - Lipid panel - atorvastatin  (LIPITOR) 40 MG tablet; Take 1 tablet (40 mg total) by mouth daily.  Dispense: 90 tablet; Refill: 0  3. Prediabetes - Routine screening.  - Hemoglobin A1c  4. Rash and nonspecific skin eruption - Triamcinolone as prescribed. Counseled on medication adherence/adverse effects.  -  Referral to Dermatology for evaluation/management.  - Follow-up with primary provider as scheduled.  - triamcinolone (KENALOG) 0.025 % cream; Apply 1 Application topically 2 (two) times daily.  Dispense: 60 g; Refill: 2 - Ambulatory referral to Dermatology  5. Language barrier - [facility]. ID#: [id number]    Patient was given the opportunity to ask questions.  Patient verbalized understanding of the plan and was able to repeat key elements of the plan. Patient was given clear instructions to go to Emergency Department or return to medical center if symptoms don't improve, worsen, or new problems develop.The patient verbalized understanding.   Orders Placed This Encounter  Procedures   Basic Metabolic Panel   Hemoglobin A1c   Lipid panel   Ambulatory referral to Dermatology     Requested Prescriptions   Signed Prescriptions Disp Refills   atorvastatin  (LIPITOR) 40 MG tablet 90 tablet 0    Sig: Take 1 tablet (40 mg total) by mouth daily.   lisinopril  (ZESTRIL ) 10 MG tablet 90 tablet 0    Sig: Take 1 tablet (10 mg total) by mouth daily.   triamcinolone (KENALOG) 0.025 % cream 60 g 2    Sig: Apply 1 Application topically 2 (two) times daily.    Return in about 3 months (around 05/23/2024) for Follow-Up or next available chronic conditions.  Greig JINNY Drones, NP

## 2024-02-22 ENCOUNTER — Ambulatory Visit: Payer: Self-pay | Admitting: Family

## 2024-02-22 LAB — LIPID PANEL
Chol/HDL Ratio: 4.4 ratio (ref 0.0–4.4)
Cholesterol, Total: 200 mg/dL — ABNORMAL HIGH (ref 100–199)
HDL: 45 mg/dL (ref >39)
LDL Chol Calc (NIH): 125 mg/dL — ABNORMAL HIGH (ref 0–99)
Triglycerides: 169 mg/dL — ABNORMAL HIGH (ref 0–149)
VLDL Cholesterol Cal: 30 mg/dL (ref 5–40)

## 2024-02-22 LAB — BASIC METABOLIC PANEL WITH GFR
BUN/Creatinine Ratio: 16 (ref 9–23)
BUN: 9 mg/dL (ref 6–20)
CO2: 21 mmol/L (ref 20–29)
Calcium: 9.3 mg/dL (ref 8.7–10.2)
Chloride: 103 mmol/L (ref 96–106)
Creatinine, Ser: 0.57 mg/dL (ref 0.57–1.00)
Glucose: 88 mg/dL (ref 70–99)
Potassium: 4.5 mmol/L (ref 3.5–5.2)
Sodium: 136 mmol/L (ref 134–144)
eGFR: 120 mL/min/1.73 (ref >59)

## 2024-02-22 LAB — HEMOGLOBIN A1C
Est. average glucose Bld gHb Est-mCnc: 123 mg/dL
Hgb A1c MFr Bld: 5.9 % — ABNORMAL HIGH (ref 4.8–5.6)

## 2024-02-23 ENCOUNTER — Other Ambulatory Visit: Payer: Self-pay

## 2024-03-22 ENCOUNTER — Other Ambulatory Visit: Payer: Self-pay

## 2024-04-13 ENCOUNTER — Other Ambulatory Visit: Payer: Self-pay

## 2024-04-15 ENCOUNTER — Encounter (HOSPITAL_COMMUNITY): Payer: Self-pay

## 2024-04-15 ENCOUNTER — Ambulatory Visit (HOSPITAL_COMMUNITY)
Admission: EM | Admit: 2024-04-15 | Discharge: 2024-04-15 | Disposition: A | Discharge status: Home / Self Care | Payer: Self-pay | Attending: Nurse Practitioner | Admitting: Nurse Practitioner

## 2024-04-15 DIAGNOSIS — J209 Acute bronchitis, unspecified: Secondary | ICD-10-CM

## 2024-04-15 MED ORDER — HYDROCOD POLI-CHLORPHE POLI ER 10-8 MG/5ML PO SUER: 5.0000 mL | Freq: Every evening | ORAL | 0 refills | Status: AC | PRN

## 2024-04-15 MED ORDER — IPRATROPIUM-ALBUTEROL 0.5-2.5 (3) MG/3ML IN SOLN
3.0000 mL | Freq: Once | RESPIRATORY_TRACT | Status: AC
  Administered 2024-04-15: 3 mL via RESPIRATORY_TRACT

## 2024-04-15 MED ORDER — BENZONATATE 200 MG PO CAPS: 200.0000 mg | ORAL_CAPSULE | Freq: Three times a day (TID) | ORAL | 0 refills | Status: AC | PRN

## 2024-04-15 MED ORDER — DOXYCYCLINE HYCLATE 100 MG PO CAPS: 100.0000 mg | ORAL_CAPSULE | Freq: Two times a day (BID) | ORAL | 0 refills | Status: AC

## 2024-04-15 MED ORDER — METHYLPREDNISOLONE 4 MG PO TBPK: ORAL_TABLET | ORAL | 0 refills | Status: AC

## 2024-04-15 MED ORDER — MUCINEX DM MAXIMUM STRENGTH 60-1200 MG PO TB12: 1.0000 | ORAL_TABLET | Freq: Two times a day (BID) | ORAL | 0 refills | Status: AC

## 2024-04-15 MED ORDER — DEXAMETHASONE SOD PHOSPHATE PF 10 MG/ML IJ SOLN
10.0000 mg | Freq: Once | INTRAMUSCULAR | Status: AC
  Administered 2024-04-15: 10 mg via INTRAMUSCULAR

## 2024-04-15 MED ORDER — IPRATROPIUM-ALBUTEROL 0.5-2.5 (3) MG/3ML IN SOLN
RESPIRATORY_TRACT | Status: AC
  Filled 2024-04-15: qty 3

## 2024-04-15 NOTE — Discharge Instructions (Addendum)
 Le han diagnosticado bronquitis aguda, que es una inflamacin de las vas respiratorias principales de los [place], causada con mayor frecuencia por una infeccin viral. Esto provoca un aumento de la mucosidad, irritacin de las vas respiratorias y tos. Hoy en la civil engineer, contracting administraron un tratamiento de nebulizacin y una inyeccin de esteroides para paramedic la irritacin de las vas respiratorias. Tome todos los medicamentos recetados segn las indicaciones. Puede usar paracetamol o ibuprofeno segn sea necesario para la fiebre, el dolor de cabeza o los dolores corporales. Mantngase bien hidratado bebiendo suficientes lquidos para que la orina sea de color amarillo claro; esto ayuda a fluidificar la mucosidad y a technical sales engineer tos. El medicamento para la tos recetado ayudar a risk manager mucosidad y a scientist, research (physical sciences). La miel (dos cucharaditas antes de acostarse) tambin puede ayudar a reducir la tos nocturna. Evite el tabaco y los productos con nicotina, ya que pueden empeorar los sntomas y estate agent. Un humidificador de vapor fro o la inhalacin de vapor durante 10 a 15 minutos varias veces al da pueden ayudar a paramedic la irritacin de las vas respiratorias. Evite el aire fro o seco, descanse lo suficiente y cambie su cepillo de dientes una vez que mejoren los sntomas. La tos puede persistir durante varias semanas despus de que desaparezcan los dems sntomas debido a la irritacin residual de las vas respiratorias. Esto es comn siempre que los sntomas mejoren gradualmente. Busque atencin mdica de emergencia de inmediato si presenta dolor en el pecho, tos con sangre, dificultad para respirar grave o que [place], [address] o sensacin de [place], dolor de cabeza intenso o fiebre o escalofros que [place].  You have been diagnosed with acute bronchitis, which is inflammation of the large airways in the lungs, most often caused by a viral illness. This leads to increased mucus,  airway irritation, and cough. You were given a breathing treatment (nebulizer) and injection of steroids which helps calm airway irritation while in the clinic today. Take all prescribed medications as directed. You may use Tylenol  or ibuprofen  as needed for fever, headache, or body aches. Stay well hydrated by drinking enough fluids to keep your urine a pale yellow, which helps thin mucus and ease coughing. The prescribed cough medication will help loosen mucus and relieve congestion. Honey (two teaspoons at bedtime) may also help reduce nighttime coughing. Avoid tobacco or nicotine products, as these can worsen symptoms and delay recovery. A cool mist humidifier or steam inhalation for 10-15 minutes several times daily may help relieve airway irritation. Avoid cold or dry air, get adequate rest, and replace your toothbrush once symptoms improve. Cough may persist for several weeks after other symptoms resolve due to lingering airway irritation. This is common as long as symptoms are gradually improving. Seek emergency care immediately for chest pain, coughing up blood, severe or worsening shortness of breath, fainting or near-fainting, severe headache, or worsening fever or chills.

## 2024-04-15 NOTE — ED Provider Notes (Signed)
 " [facility]    CSN: [id number] Arrival date & time: 04/15/24  0931      History   Chief Complaint Chief Complaint  Patient presents with   Cough    HPI Alexis Potts is a 38 y.o. female.   Spanish interpreter: Gustave Curls [phone]  Gustave had to leave during encounter and Sheree Lenis #[id number] took over interpreter services   Discussed the use of AI scribe software for clinical note transcription with the patient, who gave verbal consent to proceed.   The patient is a Spanish-speaking female who presents with a two-week history of cough, nasal congestion, and left-sided ear pain. She reports that the cough was initially productive but has become dry, persistent, and frequent. She experiences shortness of breath and chest discomfort related to coughing, with occasional post-tussive vomiting. She also endorses headaches, which she attributes to the persistent cough.  She reports associated rhinorrhea, sore throat, and sneezing that were more prominent last night but have since improved. She denies fever. She has been taking over-the-counter dextromethorphan and guaifenesin  and using steam showers with minimal symptom relief. She has access to an albuterol  inhaler but has not used it. She denies tobacco use, including smoking or vaping.  The following sections of the patient's history were reviewed and updated as appropriate: allergies, current medications, past family history, past medical history, past social history, past surgical history, and problem list.      Past Medical History:  Diagnosis Date   Anemia    Hypertension    SAB (spontaneous abortion) 06/22/2018    Patient Active Problem List   Diagnosis Date Noted   Prediabetes 01/11/2023   Hyperlipidemia 07/08/2022   Vaginal delivery 04/09/2021   Pre-existing essential hypertension during pregnancy in third trimester 02/10/2021   Supervision of high risk pregnancy, antepartum 10/13/2020   Chronic  hypertension 10/13/2020   Language barrier 08/13/2019    Past Surgical History:  Procedure Laterality Date   NO PAST SURGERIES      OB History     Gravida  4   Para  3   Term  3   Preterm  0   AB  1   Living  3      SAB  1   IAB  0   Ectopic  0   Multiple  0   Live Births  3            Home Medications    Prior to Admission medications  Medication Sig Start Date End Date Taking? Authorizing Provider  benzonatate  (TESSALON ) 200 MG capsule Take 1 capsule (200 mg total) by mouth 3 (three) times daily as needed for cough. Tomar 1 capsula (200 mg en total) por via oral 3 (tres) veces al dia segun sea necesario para la tos. 04/15/24  Yes Kayode Petion, FNP  chlorpheniramine-HYDROcodone (TUSSIONEX) 10-8 MG/5ML Take 5 mLs by mouth at bedtime as needed for cough. Tomar 5 ml por via oral antes de acostarse, segun sea necesario para la tos. 04/15/24  Yes Iola Lukes, FNP  Dextromethorphan-guaiFENesin  (MUCINEX  DM MAXIMUM STRENGTH) 60-1200 MG TB12 Take 1 tablet by mouth 2 (two) times daily. Tome 1 comprimido por via oral 2 (dos) veces al dia. 04/15/24  Yes Iola Lukes, FNP  doxycycline  (VIBRAMYCIN ) 100 MG capsule Take 1 capsule (100 mg total) by mouth 2 (two) times daily for 7 days. Tomar 1 capsula (100 mg en total) por via oral dos veces al dia durante 7 dias. 04/15/24 04/22/24 Yes  Iola Lukes, FNP  methylPREDNISolone  (MEDROL  DOSEPAK) 4 MG TBPK tablet Take as directed. Tomelo segun las indicaciones. 04/15/24  Yes Iola Lukes, FNP  albuterol  (VENTOLIN  HFA) 108 (90 Base) MCG/ACT inhaler Inhale 2 puffs into the lungs every 6 (six) hours as needed for wheezing or shortness of breath (Cough). 02/17/24   Joesph Shaver Scales, PA-C  atorvastatin  (LIPITOR) 40 MG tablet Take 1 tablet (40 mg total) by mouth daily. 02/21/24 05/23/24  Jaycee Greig PARAS, NP  cetirizine  (ZYRTEC  ALLERGY) 10 MG tablet Take 1 tablet (10 mg total) by mouth at bedtime. 02/17/24 05/17/24   Joesph Shaver Scales, PA-C  lisinopril  (ZESTRIL ) 10 MG tablet Take 1 tablet (10 mg total) by mouth daily. 02/21/24 05/23/24  Jaycee Greig PARAS, NP  triamcinolone  (KENALOG ) 0.025 % cream Apply 1 Application topically 2 (two) times daily. 02/21/24   Jaycee Greig PARAS, NP    Family History Family History  Problem Relation Age of Onset   Diabetes Brother     Social History Social History[1]   Allergies   Patient has no known allergies.   Review of Systems Review of Systems  Constitutional:  Negative for fever.  HENT:  Positive for congestion and ear pain (left). Negative for rhinorrhea, sneezing and sore throat.   Respiratory:  Positive for cough (initially productive but now more dry & persistent), chest tightness (some) and shortness of breath (only with coughing). Negative for wheezing.   Gastrointestinal:  Positive for vomiting (at times due to coughing). Negative for diarrhea and nausea.  Musculoskeletal:  Positive for myalgias (back and chest pain from coughing).  Neurological:  Positive for headaches (sometimes due to coughing).  All other systems reviewed and are negative.    Physical Exam Triage Vital Signs ED Triage Vitals  Encounter Vitals Group     BP 04/15/24 1126 129/86     Girls Systolic BP Percentile --      Girls Diastolic BP Percentile --      Boys Systolic BP Percentile --      Boys Diastolic BP Percentile --      Pulse Rate 04/15/24 1126 94     Resp 04/15/24 1126 16     Temp 04/15/24 1126 98.4 F (36.9 C)     Temp Source 04/15/24 1126 Oral     SpO2 04/15/24 1126 98 %     Weight --      Height --      Head Circumference --      Peak Flow --      Pain Score 04/15/24 1125 5     Pain Loc --      Pain Education --      Exclude from Growth Chart --    No data found.  Updated Vital Signs BP 129/86 (BP Location: Left Arm)   Pulse 94   Temp 98.4 F (36.9 C) (Oral)   Resp 16   LMP 04/09/2024 (Approximate)   SpO2 98%   Visual Acuity Right Eye Distance:    Left Eye Distance:   Bilateral Distance:    Right Eye Near:   Left Eye Near:    Bilateral Near:     Physical Exam Vitals reviewed.  Constitutional:      General: She is awake. She is not in acute distress.    Appearance: Normal appearance. She is well-developed. She is not ill-appearing, toxic-appearing or diaphoretic.  HENT:     Head: Normocephalic.     Right Ear: Hearing, tympanic membrane, ear canal and external ear  normal. No drainage, swelling or tenderness. No middle ear effusion. Tympanic membrane is not erythematous.     Left Ear: Hearing, tympanic membrane, ear canal and external ear normal. No drainage, swelling or tenderness.  No middle ear effusion. Tympanic membrane is not erythematous.     Nose: Congestion present.     Mouth/Throat:     Lips: Pink.     Mouth: Mucous membranes are moist.     Pharynx: Oropharynx is clear. Uvula midline. No pharyngeal swelling, oropharyngeal exudate, posterior oropharyngeal erythema or uvula swelling.     Tonsils: No tonsillar exudate or tonsillar abscesses.  Eyes:     General: Vision grossly intact.     Conjunctiva/sclera: Conjunctivae normal.  Cardiovascular:     Rate and Rhythm: Normal rate.     Heart sounds: Normal heart sounds.  Pulmonary:     Effort: Pulmonary effort is normal. No tachypnea or respiratory distress.     Breath sounds: Normal breath sounds and air entry.     Comments: Respirations even and unlabored  Musculoskeletal:        General: Normal range of motion.     Cervical back: Normal range of motion and neck supple.  Lymphadenopathy:     Cervical: No cervical adenopathy.  Skin:    General: Skin is warm and dry.  Neurological:     General: No focal deficit present.     Mental Status: She is alert and oriented to person, place, and time.  Psychiatric:        Behavior: Behavior is cooperative.      UC Treatments / Results  Labs (all labs ordered are listed, but only abnormal results are displayed) Labs  Reviewed - No data to display  EKG   Radiology No results found.  Procedures Procedures (including critical care time)  Medications Ordered in UC Medications  ipratropium-albuterol  (DUONEB) 0.5-2.5 (3) MG/3ML nebulizer solution 3 mL (3 mLs Nebulization Given 04/15/24 1320)  dexamethasone  (DECADRON ) injection 10 mg (10 mg Intramuscular Given 04/15/24 1320)    Initial Impression / Assessment and Plan / UC Course  I have reviewed the triage vital signs and the nursing notes.  Pertinent labs & imaging results that were available during my care of the patient were reviewed by me and considered in my medical decision making (see chart for details).     The patient presents with a two-week history of persistent cough, congestion, and left-sided ear pain without fever or systemic toxicity. The cough has evolved from productive to dry and frequent, with associated shortness of breath, chest discomfort, post-tussive emesis, and headache. Physical examination is reassuring, with clear lung and heart sounds and no adventitious findings. Given the duration of symptoms, progression, and minimal response to over-the-counter therapies, the clinical picture is consistent with acute bronchitis with associated upper respiratory symptoms. In clinic, a DuoNeb treatment was administered to help loosen secretions, and a dexamethasone  injection was given to reduce airway inflammation. Treatment was initiated with doxycycline  and an oral steroid taper, along with cough suppressants and expectorants for symptomatic relief. The patient was encouraged to use her albuterol  inhaler as needed for coughing spells and shortness of breath, increase oral fluid intake, and use a humidifier at night. She was advised to follow up with her primary care provider if symptoms do not improve within 7-10 days and to seek emergency care for worsening shortness of breath, chest pain not related to coughing, persistent vomiting, fever, or  any signs of respiratory distress.  Today's evaluation has  revealed no signs of a dangerous process. Discussed diagnosis with patient and/or guardian. Patient and/or guardian aware of their diagnosis, possible red flag symptoms to watch out for and need for close follow up. Patient and/or guardian understands verbal and written discharge instructions. Patient and/or guardian comfortable with plan and disposition.  Patient and/or guardian has a clear mental status at this time, good insight into illness (after discussion and teaching) and has clear judgment to make decisions regarding their care  Documentation was completed with the aid of voice recognition software. Transcription may contain typographical errors.  Final Clinical Impressions(s) / UC Diagnoses   Final diagnoses:  Acute bronchitis, unspecified organism     Discharge Instructions      Ladora han diagnosticado bronquitis aguda, que es una inflamacin de las vas respiratorias principales de los pulmones, causada con mayor frecuencia por una infeccin viral. Esto provoca un aumento de la mucosidad, irritacin de las vas respiratorias y tos. Hoy en la civil engineer, contracting administraron un tratamiento de nebulizacin y una inyeccin de esteroides para paramedic la irritacin de las vas respiratorias. Tome todos los medicamentos recetados segn las indicaciones. Puede usar paracetamol o ibuprofeno segn sea necesario para la fiebre, el dolor de cabeza o los dolores corporales. Mantngase bien hidratado bebiendo suficientes lquidos para que la orina sea de color amarillo claro; esto ayuda a fluidificar la mucosidad y a technical sales engineer tos. El medicamento para la tos recetado ayudar a risk manager mucosidad y a scientist, research (physical sciences). La miel (dos cucharaditas antes de acostarse) tambin puede ayudar a reducir la tos nocturna. Evite el tabaco y los productos con nicotina, ya que pueden empeorar los sntomas y estate agent. Un humidificador de vapor  fro o la inhalacin de vapor durante 10 a 15 minutos varias veces al da pueden ayudar a paramedic la irritacin de las vas respiratorias. Evite el aire fro o seco, descanse lo suficiente y cambie su cepillo de dientes una vez que mejoren los sntomas. La tos puede persistir durante varias semanas despus de que desaparezcan los dems sntomas debido a la irritacin residual de las vas respiratorias. Esto es comn siempre que los sntomas mejoren gradualmente. Busque atencin mdica de emergencia de inmediato si presenta dolor en el pecho, tos con sangre, dificultad para respirar grave o que [place], [address] o sensacin de [place], dolor de cabeza intenso o fiebre o escalofros que [place].  You have been diagnosed with acute bronchitis, which is inflammation of the large airways in the lungs, most often caused by a viral illness. This leads to increased mucus, airway irritation, and cough. You were given a breathing treatment (nebulizer) and injection of steroids which helps calm airway irritation while in the clinic today. Take all prescribed medications as directed. You may use Tylenol  or ibuprofen  as needed for fever, headache, or body aches. Stay well hydrated by drinking enough fluids to keep your urine a pale yellow, which helps thin mucus and ease coughing. The prescribed cough medication will help loosen mucus and relieve congestion. Honey (two teaspoons at bedtime) may also help reduce nighttime coughing. Avoid tobacco or nicotine products, as these can worsen symptoms and delay recovery. A cool mist humidifier or steam inhalation for 10-15 minutes several times daily may help relieve airway irritation. Avoid cold or dry air, get adequate rest, and replace your toothbrush once symptoms improve. Cough may persist for several weeks after other symptoms resolve due to lingering airway irritation. This is common as long as symptoms are gradually improving. Seek emergency  care immediately for chest pain,  coughing up blood, severe or worsening shortness of breath, fainting or near-fainting, severe headache, or worsening fever or chills.       ED Prescriptions     Medication Sig Dispense Auth. Provider   methylPREDNISolone  (MEDROL  DOSEPAK) 4 MG TBPK tablet Take as directed. Tomelo segun las indicaciones. 21 tablet Jahson Emanuele, [place], FNP   Dextromethorphan-guaiFENesin  (MUCINEX  DM MAXIMUM STRENGTH) 60-1200 MG TB12 Take 1 tablet by mouth 2 (two) times daily. Tome 1 comprimido por via oral 2 (dos) veces al dia. 20 tablet Kellis Mcadam, FNP   chlorpheniramine-HYDROcodone (TUSSIONEX) 10-8 MG/5ML Take 5 mLs by mouth at bedtime as needed for cough. Tomar 5 ml por via oral antes de acostarse, segun sea necesario para la tos. 35 mL Iola Lukes, FNP   benzonatate  (TESSALON ) 200 MG capsule Take 1 capsule (200 mg total) by mouth 3 (three) times daily as needed for cough. Tomar 1 capsula (200 mg en total) por via oral 3 (tres) veces al dia segun sea necesario para la tos. 30 capsule Iola Lukes, FNP   doxycycline  (VIBRAMYCIN ) 100 MG capsule Take 1 capsule (100 mg total) by mouth 2 (two) times daily for 7 days. Tomar 1 capsula (100 mg en total) por via oral dos veces al dia durante 7 dias. 14 capsule Iola Lukes, FNP      PDMP not reviewed this encounter.     [1]  Social History Tobacco Use   Smoking status: Never    Passive exposure: Never   Smokeless tobacco: Never  Vaping Use   Vaping status: Never Used  Substance Use Topics   Alcohol use: Not Currently   Drug use: Never     Iola Lukes, FNP 04/15/24 1326  "

## 2024-04-15 NOTE — ED Triage Notes (Signed)
 Patient here today with c/o cough, nasal congestion, left ear pain, and upper and lower back pain X 2 weeks. Denies fever. She has been taking an OTC cough DM and a Mucous DM medicine with little relief. Patient is concerned with Bronchitis. No known sick contacts.

## 2024-05-17 ENCOUNTER — Telehealth: Payer: Self-pay | Admitting: Family

## 2024-05-17 NOTE — Telephone Encounter (Signed)
 Copied from CRM #[id number]. Topic: General - Billing Inquiry >> May 17, 2024  9:41 AM Logan F wrote: Reason for CRM: Patient is asking for a call to discuss financial assistance options available to her for her upcoming appointment on 3/3.    [phone]

## 2024-05-23 ENCOUNTER — Ambulatory Visit: Payer: Self-pay | Admitting: Family

## 2024-06-19 ENCOUNTER — Ambulatory Visit: Payer: Self-pay | Admitting: Family
